# Patient Record
Sex: Male | Born: 1935 | Race: White | Hispanic: No | Smoking: Never smoker
Health system: Southern US, Community
[De-identification: ages and names within clinical notes are randomized; demographics above are authoritative.]

## PROBLEM LIST (undated history)

## (undated) DIAGNOSIS — Z950 Presence of cardiac pacemaker: Secondary | ICD-10-CM

## (undated) DIAGNOSIS — J449 Chronic obstructive pulmonary disease, unspecified: Secondary | ICD-10-CM

## (undated) DIAGNOSIS — I219 Acute myocardial infarction, unspecified: Secondary | ICD-10-CM

## (undated) DIAGNOSIS — I429 Cardiomyopathy, unspecified: Secondary | ICD-10-CM

## (undated) DIAGNOSIS — N189 Chronic kidney disease, unspecified: Secondary | ICD-10-CM

## (undated) DIAGNOSIS — K219 Gastro-esophageal reflux disease without esophagitis: Secondary | ICD-10-CM

## (undated) DIAGNOSIS — C679 Malignant neoplasm of bladder, unspecified: Secondary | ICD-10-CM

## (undated) DIAGNOSIS — C61 Malignant neoplasm of prostate: Secondary | ICD-10-CM

## (undated) DIAGNOSIS — I10 Essential (primary) hypertension: Secondary | ICD-10-CM

## (undated) DIAGNOSIS — I499 Cardiac arrhythmia, unspecified: Secondary | ICD-10-CM

## (undated) DIAGNOSIS — I5042 Chronic combined systolic (congestive) and diastolic (congestive) heart failure: Secondary | ICD-10-CM

## (undated) DIAGNOSIS — Z95810 Presence of automatic (implantable) cardiac defibrillator: Secondary | ICD-10-CM

## (undated) DIAGNOSIS — M1990 Unspecified osteoarthritis, unspecified site: Secondary | ICD-10-CM

## (undated) DIAGNOSIS — I2510 Atherosclerotic heart disease of native coronary artery without angina pectoris: Secondary | ICD-10-CM

## (undated) DIAGNOSIS — M109 Gout, unspecified: Secondary | ICD-10-CM

## (undated) DIAGNOSIS — E119 Type 2 diabetes mellitus without complications: Secondary | ICD-10-CM

## (undated) HISTORY — DX: Chronic obstructive pulmonary disease, unspecified: J44.9

## (undated) HISTORY — PX: INSERT / REPLACE / REMOVE PACEMAKER: SUR710

## (undated) HISTORY — PX: APPENDECTOMY: SHX54

## (undated) HISTORY — DX: Atherosclerotic heart disease of native coronary artery without angina pectoris: I25.10

## (undated) HISTORY — PX: TOTAL HIP ARTHROPLASTY: SHX124

## (undated) HISTORY — PX: CARDIAC CATHETERIZATION: SHX172

## (undated) HISTORY — PX: BLADDER REMOVAL: SHX567

## (undated) HISTORY — DX: Malignant neoplasm of prostate: C61

## (undated) HISTORY — DX: Presence of cardiac pacemaker: Z95.0

## (undated) HISTORY — DX: Gout, unspecified: M10.9

## (undated) HISTORY — DX: Chronic kidney disease, unspecified: N18.9

## (undated) HISTORY — PX: ILEOSTOMY: SHX1783

## (undated) HISTORY — DX: Cardiomyopathy, unspecified: I42.9

## (undated) HISTORY — PX: REVISION UROSTOMY CUTANEOUS: SUR1282

---

## 2004-08-09 ENCOUNTER — Ambulatory Visit: Payer: Self-pay | Admitting: Radiation Oncology

## 2007-11-24 ENCOUNTER — Ambulatory Visit: Payer: Self-pay | Admitting: Cardiovascular Disease

## 2008-03-07 ENCOUNTER — Ambulatory Visit: Payer: Self-pay | Admitting: Specialist

## 2008-07-07 ENCOUNTER — Ambulatory Visit: Payer: Self-pay | Admitting: Internal Medicine

## 2008-07-10 ENCOUNTER — Emergency Department: Payer: Self-pay | Admitting: Emergency Medicine

## 2008-07-11 ENCOUNTER — Inpatient Hospital Stay: Payer: Self-pay | Admitting: Internal Medicine

## 2008-07-12 ENCOUNTER — Ambulatory Visit: Payer: Self-pay | Admitting: Cardiology

## 2008-08-06 ENCOUNTER — Ambulatory Visit: Payer: Self-pay | Admitting: Internal Medicine

## 2008-09-06 ENCOUNTER — Emergency Department: Payer: Self-pay | Admitting: Emergency Medicine

## 2008-09-09 ENCOUNTER — Inpatient Hospital Stay: Payer: Self-pay | Admitting: Cardiovascular Disease

## 2009-05-27 ENCOUNTER — Inpatient Hospital Stay: Payer: Self-pay | Admitting: Internal Medicine

## 2010-03-12 ENCOUNTER — Ambulatory Visit: Payer: Self-pay | Admitting: Specialist

## 2010-03-29 ENCOUNTER — Emergency Department: Payer: Self-pay | Admitting: Emergency Medicine

## 2010-04-03 ENCOUNTER — Emergency Department: Payer: Self-pay | Admitting: Emergency Medicine

## 2010-04-23 ENCOUNTER — Emergency Department: Payer: Self-pay | Admitting: Emergency Medicine

## 2010-04-26 ENCOUNTER — Ambulatory Visit: Payer: Self-pay | Admitting: Urology

## 2010-05-02 ENCOUNTER — Ambulatory Visit: Payer: Self-pay | Admitting: Urology

## 2010-09-18 ENCOUNTER — Ambulatory Visit: Payer: Self-pay | Admitting: Urology

## 2010-09-26 ENCOUNTER — Ambulatory Visit: Payer: Self-pay | Admitting: Urology

## 2010-09-27 LAB — PATHOLOGY REPORT

## 2010-10-02 ENCOUNTER — Emergency Department: Payer: Self-pay | Admitting: Emergency Medicine

## 2010-10-16 ENCOUNTER — Ambulatory Visit: Payer: Self-pay | Admitting: Urology

## 2010-12-21 ENCOUNTER — Ambulatory Visit: Payer: Self-pay | Admitting: Urology

## 2010-12-21 DIAGNOSIS — I2510 Atherosclerotic heart disease of native coronary artery without angina pectoris: Secondary | ICD-10-CM

## 2011-01-30 ENCOUNTER — Ambulatory Visit: Payer: Self-pay | Admitting: Urology

## 2011-08-16 ENCOUNTER — Ambulatory Visit: Payer: Self-pay | Admitting: Urology

## 2011-08-17 LAB — URINE CULTURE

## 2011-08-28 ENCOUNTER — Ambulatory Visit: Payer: Self-pay | Admitting: Urology

## 2012-06-10 ENCOUNTER — Ambulatory Visit: Payer: Self-pay | Admitting: Urology

## 2012-06-10 LAB — BASIC METABOLIC PANEL
BUN: 23 mg/dL — ABNORMAL HIGH (ref 7–18)
Calcium, Total: 9.2 mg/dL (ref 8.5–10.1)
Chloride: 107 mmol/L (ref 98–107)
Glucose: 127 mg/dL — ABNORMAL HIGH (ref 65–99)
Osmolality: 283 (ref 275–301)

## 2012-06-10 LAB — CBC WITH DIFFERENTIAL/PLATELET
Basophil #: 0 10*3/uL (ref 0.0–0.1)
HCT: 39.2 % — ABNORMAL LOW (ref 40.0–52.0)
HGB: 13.3 g/dL (ref 13.0–18.0)
Lymphocyte #: 1.4 10*3/uL (ref 1.0–3.6)
Lymphocyte %: 19.3 %
MCH: 31.9 pg (ref 26.0–34.0)
MCHC: 33.9 g/dL (ref 32.0–36.0)
Monocyte #: 0.6 x10 3/mm (ref 0.2–1.0)
Platelet: 141 10*3/uL — ABNORMAL LOW (ref 150–440)
RBC: 4.17 10*6/uL — ABNORMAL LOW (ref 4.40–5.90)
RDW: 13.5 % (ref 11.5–14.5)
WBC: 7 10*3/uL (ref 3.8–10.6)

## 2012-06-12 LAB — URINE CULTURE

## 2012-06-17 ENCOUNTER — Ambulatory Visit: Payer: Self-pay | Admitting: Urology

## 2012-08-07 LAB — PATHOLOGY REPORT

## 2012-10-02 DIAGNOSIS — K219 Gastro-esophageal reflux disease without esophagitis: Secondary | ICD-10-CM | POA: Insufficient documentation

## 2012-10-02 DIAGNOSIS — M1990 Unspecified osteoarthritis, unspecified site: Secondary | ICD-10-CM | POA: Insufficient documentation

## 2012-10-02 DIAGNOSIS — E119 Type 2 diabetes mellitus without complications: Secondary | ICD-10-CM | POA: Insufficient documentation

## 2012-10-02 DIAGNOSIS — Z95810 Presence of automatic (implantable) cardiac defibrillator: Secondary | ICD-10-CM | POA: Insufficient documentation

## 2012-11-02 ENCOUNTER — Inpatient Hospital Stay: Payer: Self-pay | Admitting: Internal Medicine

## 2012-11-02 LAB — URINALYSIS, COMPLETE
Bacteria: NONE SEEN
Bilirubin,UR: NEGATIVE
Glucose,UR: NEGATIVE mg/dL (ref 0–75)
Nitrite: NEGATIVE
Ph: 6 (ref 4.5–8.0)
Protein: NEGATIVE
RBC,UR: 6 /HPF (ref 0–5)
Squamous Epithelial: NONE SEEN
WBC UR: 23 /HPF (ref 0–5)

## 2012-11-02 LAB — COMPREHENSIVE METABOLIC PANEL
Alkaline Phosphatase: 164 U/L — ABNORMAL HIGH (ref 50–136)
Bilirubin,Total: 1.2 mg/dL — ABNORMAL HIGH (ref 0.2–1.0)
Chloride: 98 mmol/L (ref 98–107)
Co2: 22 mmol/L (ref 21–32)
Creatinine: 1.62 mg/dL — ABNORMAL HIGH (ref 0.60–1.30)
EGFR (African American): 46 — ABNORMAL LOW
EGFR (Non-African Amer.): 40 — ABNORMAL LOW
Sodium: 131 mmol/L — ABNORMAL LOW (ref 136–145)
Total Protein: 6.7 g/dL (ref 6.4–8.2)

## 2012-11-02 LAB — CBC
HCT: 33.4 % — ABNORMAL LOW (ref 40.0–52.0)
HGB: 11.1 g/dL — ABNORMAL LOW (ref 13.0–18.0)
MCHC: 33.2 g/dL (ref 32.0–36.0)
MCV: 91 fL (ref 80–100)
Platelet: 383 10*3/uL (ref 150–440)
RBC: 3.66 10*6/uL — ABNORMAL LOW (ref 4.40–5.90)
RDW: 14.4 % (ref 11.5–14.5)
WBC: 26.9 10*3/uL — ABNORMAL HIGH (ref 3.8–10.6)

## 2012-11-03 LAB — CBC WITH DIFFERENTIAL/PLATELET
Basophil #: 0.1 10*3/uL (ref 0.0–0.1)
Eosinophil #: 0 10*3/uL (ref 0.0–0.7)
Eosinophil %: 0.2 %
HCT: 29.8 % — ABNORMAL LOW (ref 40.0–52.0)
HGB: 10 g/dL — ABNORMAL LOW (ref 13.0–18.0)
Lymphocyte #: 1.3 10*3/uL (ref 1.0–3.6)
Lymphocyte %: 5.6 %
MCH: 30.9 pg (ref 26.0–34.0)
MCHC: 33.6 g/dL (ref 32.0–36.0)
MCV: 92 fL (ref 80–100)
Monocyte #: 1.3 x10 3/mm — ABNORMAL HIGH (ref 0.2–1.0)
Monocyte %: 5.5 %
Neutrophil #: 20.2 10*3/uL — ABNORMAL HIGH (ref 1.4–6.5)
RBC: 3.24 10*6/uL — ABNORMAL LOW (ref 4.40–5.90)
RDW: 14.3 % (ref 11.5–14.5)
WBC: 22.9 10*3/uL — ABNORMAL HIGH (ref 3.8–10.6)

## 2012-11-03 LAB — BASIC METABOLIC PANEL
Anion Gap: 6 — ABNORMAL LOW (ref 7–16)
Chloride: 104 mmol/L (ref 98–107)
Co2: 22 mmol/L (ref 21–32)
EGFR (African American): >60
EGFR (Non-African Amer.): 56 — ABNORMAL LOW
Osmolality: 265 (ref 275–301)

## 2012-11-04 LAB — BASIC METABOLIC PANEL
Anion Gap: 6 — ABNORMAL LOW (ref 7–16)
BUN: 17 mg/dL (ref 7–18)
Calcium, Total: 8.8 mg/dL (ref 8.5–10.1)
Chloride: 104 mmol/L (ref 98–107)
Co2: 24 mmol/L (ref 21–32)
EGFR (Non-African Amer.): 57 — ABNORMAL LOW
Glucose: 75 mg/dL (ref 65–99)
Potassium: 3.9 mmol/L (ref 3.5–5.1)

## 2012-11-04 LAB — CBC WITH DIFFERENTIAL/PLATELET
Basophil %: 0.2 %
Eosinophil %: 1.3 %
MCH: 32.6 pg (ref 26.0–34.0)
MCV: 92 fL (ref 80–100)
Monocyte #: 0.7 x10 3/mm (ref 0.2–1.0)
Neutrophil #: 13.4 10*3/uL — ABNORMAL HIGH (ref 1.4–6.5)
Neutrophil %: 88.1 %
RDW: 14.3 % (ref 11.5–14.5)

## 2012-11-05 LAB — CBC WITH DIFFERENTIAL/PLATELET
Lymphocyte #: 0.9 10*3/uL — ABNORMAL LOW (ref 1.0–3.6)
MCH: 31.3 pg (ref 26.0–34.0)
MCHC: 34.6 g/dL (ref 32.0–36.0)
Monocyte #: 0.4 x10 3/mm (ref 0.2–1.0)
Neutrophil %: 79.7 %
Platelet: 246 10*3/uL (ref 150–440)
RBC: 3.08 10*6/uL — ABNORMAL LOW (ref 4.40–5.90)

## 2012-11-05 LAB — URINE CULTURE

## 2012-11-05 LAB — BASIC METABOLIC PANEL
Anion Gap: 5 — ABNORMAL LOW (ref 7–16)
Calcium, Total: 9.3 mg/dL (ref 8.5–10.1)
Chloride: 104 mmol/L (ref 98–107)
EGFR (Non-African Amer.): >60
Osmolality: 271 (ref 275–301)
Sodium: 135 mmol/L — ABNORMAL LOW (ref 136–145)

## 2012-11-06 LAB — CBC WITH DIFFERENTIAL/PLATELET
Basophil %: 0.9 %
Eosinophil #: 0.5 10*3/uL (ref 0.0–0.7)
Eosinophil %: 7.6 %
HCT: 30.1 % — ABNORMAL LOW (ref 40.0–52.0)
Lymphocyte #: 0.7 10*3/uL — ABNORMAL LOW (ref 1.0–3.6)
MCH: 31.2 pg (ref 26.0–34.0)
MCHC: 34.3 g/dL (ref 32.0–36.0)
MCV: 91 fL (ref 80–100)
Monocyte #: 0.5 x10 3/mm (ref 0.2–1.0)
Monocyte %: 6.9 %
Neutrophil #: 5.2 10*3/uL (ref 1.4–6.5)
Neutrophil %: 75 %
RDW: 14 % (ref 11.5–14.5)
WBC: 6.9 10*3/uL (ref 3.8–10.6)

## 2012-11-06 LAB — BASIC METABOLIC PANEL
BUN: 14 mg/dL (ref 7–18)
Calcium, Total: 9.7 mg/dL (ref 8.5–10.1)
Chloride: 101 mmol/L (ref 98–107)
Creatinine: 1.1 mg/dL (ref 0.60–1.30)
EGFR (Non-African Amer.): >60
Osmolality: 271 (ref 275–301)
Sodium: 135 mmol/L — ABNORMAL LOW (ref 136–145)

## 2012-11-07 LAB — CULTURE, BLOOD (SINGLE)

## 2013-11-16 ENCOUNTER — Ambulatory Visit: Payer: Self-pay | Admitting: Urology

## 2013-12-20 ENCOUNTER — Ambulatory Visit: Payer: Self-pay | Admitting: Urology

## 2013-12-20 LAB — CBC WITH DIFFERENTIAL/PLATELET
Basophil #: 0.1 10*3/uL (ref 0.0–0.1)
Basophil %: 1.2 %
Eosinophil #: 0.3 10*3/uL (ref 0.0–0.7)
Eosinophil %: 3.8 %
HCT: 45.5 % (ref 40.0–52.0)
HGB: 14.5 g/dL (ref 13.0–18.0)
LYMPHS ABS: 1.3 10*3/uL (ref 1.0–3.6)
LYMPHS PCT: 14.8 %
MCH: 30.8 pg (ref 26.0–34.0)
MCHC: 31.9 g/dL — ABNORMAL LOW (ref 32.0–36.0)
MCV: 97 fL (ref 80–100)
MONO ABS: 0.6 x10 3/mm (ref 0.2–1.0)
Monocyte %: 6.4 %
Neutrophil #: 6.6 10*3/uL — ABNORMAL HIGH (ref 1.4–6.5)
Neutrophil %: 73.8 %
Platelet: 175 10*3/uL (ref 150–440)
RBC: 4.71 10*6/uL (ref 4.40–5.90)
RDW: 13.7 % (ref 11.5–14.5)
WBC: 8.9 10*3/uL (ref 3.8–10.6)

## 2013-12-20 LAB — URINALYSIS, COMPLETE
Bilirubin,UR: NEGATIVE
GLUCOSE, UR: NEGATIVE mg/dL (ref 0–75)
Ketone: NEGATIVE
NITRITE: POSITIVE
Ph: 5 (ref 4.5–8.0)
Protein: NEGATIVE
SPECIFIC GRAVITY: 1.013 (ref 1.003–1.030)
Squamous Epithelial: NONE SEEN

## 2013-12-20 LAB — BASIC METABOLIC PANEL
Anion Gap: 7 (ref 7–16)
BUN: 23 mg/dL — ABNORMAL HIGH (ref 7–18)
CALCIUM: 9.7 mg/dL (ref 8.5–10.1)
Chloride: 104 mmol/L (ref 98–107)
Co2: 26 mmol/L (ref 21–32)
Creatinine: 1.59 mg/dL — ABNORMAL HIGH (ref 0.60–1.30)
EGFR (African American): 47 — ABNORMAL LOW
EGFR (Non-African Amer.): 41 — ABNORMAL LOW
GLUCOSE: 105 mg/dL — AB (ref 65–99)
Osmolality: 278 (ref 275–301)
Potassium: 4.7 mmol/L (ref 3.5–5.1)
Sodium: 137 mmol/L (ref 136–145)

## 2013-12-21 LAB — URINE CULTURE

## 2013-12-22 ENCOUNTER — Ambulatory Visit: Payer: Self-pay | Admitting: Urology

## 2013-12-28 LAB — PATHOLOGY REPORT

## 2014-01-24 ENCOUNTER — Ambulatory Visit: Payer: Self-pay | Admitting: Oncology

## 2014-01-26 ENCOUNTER — Ambulatory Visit: Payer: Self-pay | Admitting: Oncology

## 2014-02-06 ENCOUNTER — Ambulatory Visit: Payer: Self-pay | Admitting: Oncology

## 2014-02-17 ENCOUNTER — Ambulatory Visit: Payer: Self-pay | Admitting: Vascular Surgery

## 2014-02-21 LAB — CBC CANCER CENTER
BASOS ABS: 0.1 x10 3/mm (ref 0.0–0.1)
Basophil %: 1.5 %
Eosinophil #: 0.7 x10 3/mm (ref 0.0–0.7)
Eosinophil %: 9.3 %
HCT: 42.9 % (ref 40.0–52.0)
HGB: 14 g/dL (ref 13.0–18.0)
Lymphocyte #: 1.2 x10 3/mm (ref 1.0–3.6)
Lymphocyte %: 15.7 %
MCH: 31 pg (ref 26.0–34.0)
MCHC: 32.6 g/dL (ref 32.0–36.0)
MCV: 95 fL (ref 80–100)
MONO ABS: 0.4 x10 3/mm (ref 0.2–1.0)
Monocyte %: 5.2 %
Neutrophil #: 5.4 x10 3/mm (ref 1.4–6.5)
Neutrophil %: 68.3 %
Platelet: 152 x10 3/mm (ref 150–440)
RBC: 4.52 10*6/uL (ref 4.40–5.90)
RDW: 14.2 % (ref 11.5–14.5)
WBC: 7.9 x10 3/mm (ref 3.8–10.6)

## 2014-02-21 LAB — COMPREHENSIVE METABOLIC PANEL
ALK PHOS: 69 U/L
ALT: 19 U/L
Albumin: 3.7 g/dL (ref 3.4–5.0)
Anion Gap: 7 (ref 7–16)
BILIRUBIN TOTAL: 0.4 mg/dL (ref 0.2–1.0)
BUN: 16 mg/dL (ref 7–18)
CALCIUM: 9.6 mg/dL (ref 8.5–10.1)
CHLORIDE: 104 mmol/L (ref 98–107)
CO2: 29 mmol/L (ref 21–32)
Creatinine: 1.45 mg/dL — ABNORMAL HIGH (ref 0.60–1.30)
EGFR (African American): >60
EGFR (Non-African Amer.): 50 — ABNORMAL LOW
Glucose: 109 mg/dL — ABNORMAL HIGH (ref 65–99)
Osmolality: 281 (ref 275–301)
Potassium: 4.5 mmol/L (ref 3.5–5.1)
SGOT(AST): 18 U/L (ref 15–37)
SODIUM: 140 mmol/L (ref 136–145)
Total Protein: 7 g/dL (ref 6.4–8.2)

## 2014-02-28 LAB — COMPREHENSIVE METABOLIC PANEL
ALK PHOS: 73 U/L
ALT: 27 U/L
Albumin: 3.9 g/dL (ref 3.4–5.0)
Anion Gap: 10 (ref 7–16)
BUN: 27 mg/dL — ABNORMAL HIGH (ref 7–18)
Bilirubin,Total: 0.7 mg/dL (ref 0.2–1.0)
CHLORIDE: 101 mmol/L (ref 98–107)
CO2: 26 mmol/L (ref 21–32)
Calcium, Total: 9.9 mg/dL (ref 8.5–10.1)
Creatinine: 1.57 mg/dL — ABNORMAL HIGH (ref 0.60–1.30)
EGFR (African American): 55 — ABNORMAL LOW
EGFR (Non-African Amer.): 46 — ABNORMAL LOW
GLUCOSE: 116 mg/dL — AB (ref 65–99)
Osmolality: 280 (ref 275–301)
Potassium: 4.4 mmol/L (ref 3.5–5.1)
SGOT(AST): 18 U/L (ref 15–37)
SODIUM: 137 mmol/L (ref 136–145)
TOTAL PROTEIN: 7.1 g/dL (ref 6.4–8.2)

## 2014-02-28 LAB — CBC CANCER CENTER
BASOS PCT: 1.1 %
Basophil #: 0 x10 3/mm (ref 0.0–0.1)
EOS ABS: 0.4 x10 3/mm (ref 0.0–0.7)
EOS PCT: 10.4 %
HCT: 41.8 % (ref 40.0–52.0)
HGB: 13.3 g/dL (ref 13.0–18.0)
Lymphocyte #: 1.4 x10 3/mm (ref 1.0–3.6)
Lymphocyte %: 34.5 %
MCH: 30.6 pg (ref 26.0–34.0)
MCHC: 31.8 g/dL — ABNORMAL LOW (ref 32.0–36.0)
MCV: 96 fL (ref 80–100)
Monocyte #: 0.1 x10 3/mm — ABNORMAL LOW (ref 0.2–1.0)
Monocyte %: 2 %
NEUTROS ABS: 2.1 x10 3/mm (ref 1.4–6.5)
NEUTROS PCT: 52 %
PLATELETS: 117 x10 3/mm — AB (ref 150–440)
RBC: 4.34 10*6/uL — ABNORMAL LOW (ref 4.40–5.90)
RDW: 13.9 % (ref 11.5–14.5)
WBC: 4 x10 3/mm (ref 3.8–10.6)

## 2014-03-07 LAB — CBC CANCER CENTER
Basophil #: 0 x10 3/mm (ref 0.0–0.1)
Basophil %: 1 %
Eosinophil #: 0.1 x10 3/mm (ref 0.0–0.7)
Eosinophil %: 5.3 %
HCT: 34.2 % — ABNORMAL LOW (ref 40.0–52.0)
HGB: 11.1 g/dL — ABNORMAL LOW (ref 13.0–18.0)
LYMPHS ABS: 0.7 x10 3/mm — AB (ref 1.0–3.6)
Lymphocyte %: 63.9 %
MCH: 30.8 pg (ref 26.0–34.0)
MCHC: 32.6 g/dL (ref 32.0–36.0)
MCV: 95 fL (ref 80–100)
Monocyte #: 0 x10 3/mm — ABNORMAL LOW (ref 0.2–1.0)
Monocyte %: 1 %
NEUTROS ABS: 0.3 x10 3/mm — AB (ref 1.4–6.5)
Neutrophil %: 28.8 %
Platelet: 33 x10 3/mm — ABNORMAL LOW (ref 150–440)
RBC: 3.61 10*6/uL — ABNORMAL LOW (ref 4.40–5.90)
RDW: 13.3 % (ref 11.5–14.5)
WBC: 1.1 x10 3/mm — AB (ref 3.8–10.6)

## 2014-03-07 LAB — BASIC METABOLIC PANEL
ANION GAP: 7 (ref 7–16)
BUN: 25 mg/dL — AB (ref 7–18)
CALCIUM: 9.2 mg/dL (ref 8.5–10.1)
Chloride: 100 mmol/L (ref 98–107)
Co2: 29 mmol/L (ref 21–32)
Creatinine: 1.58 mg/dL — ABNORMAL HIGH (ref 0.60–1.30)
EGFR (Non-African Amer.): 45 — ABNORMAL LOW
GFR CALC AF AMER: 55 — AB
Glucose: 102 mg/dL — ABNORMAL HIGH (ref 65–99)
Osmolality: 277 (ref 275–301)
Potassium: 4.9 mmol/L (ref 3.5–5.1)
SODIUM: 136 mmol/L (ref 136–145)

## 2014-03-08 ENCOUNTER — Ambulatory Visit: Payer: Self-pay | Admitting: Oncology

## 2014-03-21 LAB — COMPREHENSIVE METABOLIC PANEL
Albumin: 3.5 g/dL (ref 3.4–5.0)
Alkaline Phosphatase: 88 U/L
Anion Gap: 7 (ref 7–16)
BUN: 15 mg/dL (ref 7–18)
Bilirubin,Total: 0.3 mg/dL (ref 0.2–1.0)
CALCIUM: 9.7 mg/dL (ref 8.5–10.1)
Chloride: 102 mmol/L (ref 98–107)
Co2: 30 mmol/L (ref 21–32)
Creatinine: 1.34 mg/dL — ABNORMAL HIGH (ref 0.60–1.30)
EGFR (Non-African Amer.): 55 — ABNORMAL LOW
GLUCOSE: 108 mg/dL — AB (ref 65–99)
Osmolality: 279 (ref 275–301)
POTASSIUM: 4.1 mmol/L (ref 3.5–5.1)
SGOT(AST): 26 U/L (ref 15–37)
SGPT (ALT): 30 U/L
Sodium: 139 mmol/L (ref 136–145)
Total Protein: 7 g/dL (ref 6.4–8.2)

## 2014-03-21 LAB — CBC CANCER CENTER
BASOS PCT: 1 %
Basophil #: 0.1 x10 3/mm (ref 0.0–0.1)
Eosinophil #: 0.1 x10 3/mm (ref 0.0–0.7)
Eosinophil %: 1.6 %
HCT: 36.7 % — ABNORMAL LOW (ref 40.0–52.0)
HGB: 12.1 g/dL — ABNORMAL LOW (ref 13.0–18.0)
LYMPHS ABS: 1.5 x10 3/mm (ref 1.0–3.6)
Lymphocyte %: 19.3 %
MCH: 30.9 pg (ref 26.0–34.0)
MCHC: 33 g/dL (ref 32.0–36.0)
MCV: 94 fL (ref 80–100)
MONOS PCT: 11.5 %
Monocyte #: 0.9 x10 3/mm (ref 0.2–1.0)
NEUTROS PCT: 66.6 %
Neutrophil #: 5.1 x10 3/mm (ref 1.4–6.5)
PLATELETS: 341 x10 3/mm (ref 150–440)
RBC: 3.91 10*6/uL — AB (ref 4.40–5.90)
RDW: 14 % (ref 11.5–14.5)
WBC: 7.7 x10 3/mm (ref 3.8–10.6)

## 2014-03-28 LAB — COMPREHENSIVE METABOLIC PANEL
ANION GAP: 9 (ref 7–16)
Albumin: 3.5 g/dL (ref 3.4–5.0)
Alkaline Phosphatase: 86 U/L
BUN: 23 mg/dL — ABNORMAL HIGH (ref 7–18)
Bilirubin,Total: 0.3 mg/dL (ref 0.2–1.0)
CALCIUM: 9.3 mg/dL (ref 8.5–10.1)
CREATININE: 1.51 mg/dL — AB (ref 0.60–1.30)
Chloride: 102 mmol/L (ref 98–107)
Co2: 28 mmol/L (ref 21–32)
EGFR (African American): 58 — ABNORMAL LOW
GFR CALC NON AF AMER: 48 — AB
Glucose: 156 mg/dL — ABNORMAL HIGH (ref 65–99)
Osmolality: 284 (ref 275–301)
POTASSIUM: 4.2 mmol/L (ref 3.5–5.1)
SGOT(AST): 24 U/L (ref 15–37)
SGPT (ALT): 48 U/L
SODIUM: 139 mmol/L (ref 136–145)
Total Protein: 6.7 g/dL (ref 6.4–8.2)

## 2014-03-28 LAB — CBC CANCER CENTER
BASOS ABS: 0 x10 3/mm (ref 0.0–0.1)
Basophil %: 0.3 %
EOS ABS: 0.1 x10 3/mm (ref 0.0–0.7)
Eosinophil %: 2.2 %
HCT: 35 % — ABNORMAL LOW (ref 40.0–52.0)
HGB: 11.5 g/dL — AB (ref 13.0–18.0)
LYMPHS ABS: 1.2 x10 3/mm (ref 1.0–3.6)
LYMPHS PCT: 28.5 %
MCH: 31 pg (ref 26.0–34.0)
MCHC: 33 g/dL (ref 32.0–36.0)
MCV: 94 fL (ref 80–100)
Monocyte #: 0.3 x10 3/mm (ref 0.2–1.0)
Monocyte %: 7.2 %
NEUTROS ABS: 2.5 x10 3/mm (ref 1.4–6.5)
NEUTROS PCT: 61.8 %
Platelet: 159 x10 3/mm (ref 150–440)
RBC: 3.72 10*6/uL — ABNORMAL LOW (ref 4.40–5.90)
RDW: 15 % — AB (ref 11.5–14.5)
WBC: 4.1 x10 3/mm (ref 3.8–10.6)

## 2014-04-08 ENCOUNTER — Ambulatory Visit: Payer: Self-pay | Admitting: Oncology

## 2014-04-11 LAB — BASIC METABOLIC PANEL
ANION GAP: 9 (ref 7–16)
BUN: 19 mg/dL — ABNORMAL HIGH (ref 7–18)
CO2: 27 mmol/L (ref 21–32)
CREATININE: 1.67 mg/dL — AB (ref 0.60–1.30)
Calcium, Total: 9.5 mg/dL (ref 8.5–10.1)
Chloride: 102 mmol/L (ref 98–107)
GFR CALC AF AMER: 51 — AB
GFR CALC NON AF AMER: 42 — AB
GLUCOSE: 90 mg/dL (ref 65–99)
Osmolality: 277 (ref 275–301)
Potassium: 4.3 mmol/L (ref 3.5–5.1)
SODIUM: 138 mmol/L (ref 136–145)

## 2014-04-11 LAB — CBC CANCER CENTER
Basophil #: 0.1 x10 3/mm (ref 0.0–0.1)
Basophil %: 1.3 %
EOS PCT: 8 %
Eosinophil #: 0.4 x10 3/mm (ref 0.0–0.7)
HCT: 33.8 % — AB (ref 40.0–52.0)
HGB: 11.3 g/dL — ABNORMAL LOW (ref 13.0–18.0)
LYMPHS ABS: 1.3 x10 3/mm (ref 1.0–3.6)
Lymphocyte %: 28.2 %
MCH: 31.4 pg (ref 26.0–34.0)
MCHC: 33.3 g/dL (ref 32.0–36.0)
MCV: 94 fL (ref 80–100)
MONOS PCT: 10.6 %
Monocyte #: 0.5 x10 3/mm (ref 0.2–1.0)
NEUTROS ABS: 2.5 x10 3/mm (ref 1.4–6.5)
Neutrophil %: 51.9 %
Platelet: 217 x10 3/mm (ref 150–440)
RBC: 3.58 10*6/uL — ABNORMAL LOW (ref 4.40–5.90)
RDW: 15.9 % — ABNORMAL HIGH (ref 11.5–14.5)
WBC: 4.8 x10 3/mm (ref 3.8–10.6)

## 2014-04-18 LAB — CBC CANCER CENTER
BASOS ABS: 0 x10 3/mm (ref 0.0–0.1)
BASOS PCT: 0.4 %
EOS PCT: 4 %
Eosinophil #: 0.1 x10 3/mm (ref 0.0–0.7)
HCT: 32.5 % — AB (ref 40.0–52.0)
HGB: 10.8 g/dL — ABNORMAL LOW (ref 13.0–18.0)
LYMPHS ABS: 1.2 x10 3/mm (ref 1.0–3.6)
Lymphocyte %: 39.6 %
MCH: 31.9 pg (ref 26.0–34.0)
MCHC: 33.4 g/dL (ref 32.0–36.0)
MCV: 96 fL (ref 80–100)
MONO ABS: 0.2 x10 3/mm (ref 0.2–1.0)
Monocyte %: 5.5 %
Neutrophil #: 1.5 x10 3/mm (ref 1.4–6.5)
Neutrophil %: 50.5 %
Platelet: 229 x10 3/mm (ref 150–440)
RBC: 3.4 10*6/uL — ABNORMAL LOW (ref 4.40–5.90)
RDW: 16.4 % — ABNORMAL HIGH (ref 11.5–14.5)
WBC: 2.9 x10 3/mm — AB (ref 3.8–10.6)

## 2014-04-18 LAB — BASIC METABOLIC PANEL
ANION GAP: 5 — AB (ref 7–16)
BUN: 22 mg/dL — ABNORMAL HIGH (ref 7–18)
CHLORIDE: 103 mmol/L (ref 98–107)
CREATININE: 1.53 mg/dL — AB (ref 0.60–1.30)
Calcium, Total: 9.7 mg/dL (ref 8.5–10.1)
Co2: 30 mmol/L (ref 21–32)
EGFR (African American): 57 — ABNORMAL LOW
EGFR (Non-African Amer.): 47 — ABNORMAL LOW
Glucose: 113 mg/dL — ABNORMAL HIGH (ref 65–99)
Osmolality: 280 (ref 275–301)
Potassium: 4.4 mmol/L (ref 3.5–5.1)
SODIUM: 138 mmol/L (ref 136–145)

## 2014-05-02 LAB — CBC CANCER CENTER
Basophil #: 0 x10 3/mm (ref 0.0–0.1)
Basophil %: 1.2 %
EOS ABS: 0.2 x10 3/mm (ref 0.0–0.7)
Eosinophil %: 6.4 %
HCT: 26.8 % — ABNORMAL LOW (ref 40.0–52.0)
HGB: 9 g/dL — AB (ref 13.0–18.0)
LYMPHS ABS: 0.8 x10 3/mm — AB (ref 1.0–3.6)
LYMPHS PCT: 24.6 %
MCH: 32.8 pg (ref 26.0–34.0)
MCHC: 33.4 g/dL (ref 32.0–36.0)
MCV: 98 fL (ref 80–100)
Monocyte #: 0.5 x10 3/mm (ref 0.2–1.0)
Monocyte %: 15.4 %
NEUTROS ABS: 1.6 x10 3/mm (ref 1.4–6.5)
Neutrophil %: 52.4 %
Platelet: 123 x10 3/mm — ABNORMAL LOW (ref 150–440)
RBC: 2.74 10*6/uL — ABNORMAL LOW (ref 4.40–5.90)
RDW: 19.3 % — AB (ref 11.5–14.5)
WBC: 3.1 x10 3/mm — ABNORMAL LOW (ref 3.8–10.6)

## 2014-05-02 LAB — BASIC METABOLIC PANEL
Anion Gap: 9 (ref 7–16)
BUN: 18 mg/dL (ref 7–18)
CALCIUM: 9.2 mg/dL (ref 8.5–10.1)
Chloride: 101 mmol/L (ref 98–107)
Co2: 27 mmol/L (ref 21–32)
Creatinine: 1.59 mg/dL — ABNORMAL HIGH (ref 0.60–1.30)
EGFR (African American): 54 — ABNORMAL LOW
GFR CALC NON AF AMER: 45 — AB
GLUCOSE: 175 mg/dL — AB (ref 65–99)
OSMOLALITY: 280 (ref 275–301)
POTASSIUM: 4.5 mmol/L (ref 3.5–5.1)
Sodium: 137 mmol/L (ref 136–145)

## 2014-05-09 ENCOUNTER — Ambulatory Visit: Payer: Self-pay | Admitting: Oncology

## 2014-05-09 LAB — CBC CANCER CENTER
Basophil #: 0 10*3/uL
Basophil %: 1.4 %
Eosinophil #: 0.1 10*3/uL
Eosinophil %: 3 %
HCT: 27.1 % — ABNORMAL LOW
HGB: 8.9 g/dL — ABNORMAL LOW
Lymphocyte %: 31.2 %
Lymphs Abs: 0.7 10*3/uL — ABNORMAL LOW
MCH: 32.3 pg
MCHC: 32.7 g/dL
MCV: 99 fL
Monocyte #: 0.1 10*3/uL — ABNORMAL LOW
Monocyte %: 5.3 %
Neutrophil #: 1.3 10*3/uL — ABNORMAL LOW
Neutrophil %: 59.1 %
Platelet: 161 10*3/uL
RBC: 2.74 10*6/uL — ABNORMAL LOW
RDW: 18.9 % — ABNORMAL HIGH
WBC: 2.2 10*3/uL — ABNORMAL LOW

## 2014-05-09 LAB — BASIC METABOLIC PANEL
ANION GAP: 3 — AB (ref 7–16)
BUN: 18 mg/dL (ref 7–18)
CO2: 29 mmol/L (ref 21–32)
CREATININE: 1.47 mg/dL — AB (ref 0.60–1.30)
Calcium, Total: 9.3 mg/dL (ref 8.5–10.1)
Chloride: 105 mmol/L (ref 98–107)
GFR CALC AF AMER: 59 — AB
GFR CALC NON AF AMER: 49 — AB
Glucose: 110 mg/dL — ABNORMAL HIGH (ref 65–99)
Osmolality: 276 (ref 275–301)
Potassium: 4.1 mmol/L (ref 3.5–5.1)
SODIUM: 137 mmol/L (ref 136–145)

## 2014-06-07 ENCOUNTER — Ambulatory Visit
Admit: 2014-06-07 | Disposition: A | Discharge status: Home / Self Care | Payer: Self-pay | Attending: Oncology | Admitting: Oncology

## 2014-06-21 ENCOUNTER — Ambulatory Visit: Payer: Self-pay | Admitting: Oncology

## 2014-07-08 ENCOUNTER — Ambulatory Visit
Admit: 2014-07-08 | Disposition: A | Discharge status: Home / Self Care | Payer: Self-pay | Attending: Oncology | Admitting: Oncology

## 2014-07-29 NOTE — Op Note (Signed)
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   PROCEDURE: Cystoscopy with bladder biopsies.   SURGEON: John Giovanni, M.D.   ASSISTANT: None.   ANESTHESIA: General.   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 He has significant urinary frequency, urgency and refused BCG therapy. He has had 3 subsequent bladder biopsies which have been negative for recurrent tumor. Recent surveillance cystoscopy did show bladder erythema. A urine cytology was suspicious for malignant cells. He presents for cystoscopy under anesthesia and bladder biopsies, possible TURBT.   DESCRIPTION OF PROCEDURE: He was taken to the operating room where a general anesthetic was administered. He was then placed in the low lithotomy position and his external genitalia were prepped and draped in the usual fashion. A timeout was performed per protocol. A 21-French cystoscope with 30 degree lens was lubricated and passed under direct vision. The urethra was normal in caliber without stricture. The prostate was nonocclusive. There was an area endoscopically consistent with squamous metaplasia, on the left lateral lobe. Bladder mucosa was closely inspected. There was moderate trabeculation present. There were areas endoscopically consistent with squamous metaplasia, on the trigone. There was mild erythema on the posterior and lateral walls. No papillary or solid lesions were identified.  Ureteral orifices were normal-appearing with clear efflux. Cold cup bladder biopsies were then obtained, of the areas of erythema, on the posterior lateral walls. The  areas of squamous metaplasia, on the trigone and prostate, were also biopsied and sent separately. Hemostasis was obtained with a Bugbee electrode. Once hemostasis was adequate, the cystoscope was removed. An 18-French Foley catheter was placed with return of light rosea effluent upon irrigation. He was taken to PAC-U in stable condition. There were no complications. EBL minimal.  ____________________________ Ronda Fairly. Bernardo Heater, MD scs:sb D: 06/18/2012 09:16:57 ET T: 06/18/2012 09:29:34 ET JOB#: [id number]  cc: Nicki Reaper C. Bernardo Heater, MD, <Dictator> Abbie Sons MD ELECTRONICALLY SIGNED 06/18/2012 22:52

## 2014-07-29 NOTE — Discharge Summary (Signed)
PATIENT NAME:  Bryce Clark, Bryce Clark MR#:  [id number] DATE OF BIRTH:  Jun 07, 1932  DATE OF ADMISSION:  11/02/2012 DATE OF DISCHARGE:  11/06/2012  ADMITTING PHYSICIAN: Dr. Lunette Stands PRIMARY CARE PHYSICIAN: Dr. Elijio Miles    PROCEDURES: None.   DISCHARGE DIAGNOSES: 1.  Urinary tract infection.  2.  Septicemia.  3.  Diabetes mellitus.  4.  Acute renal failure.   DISCHARGE MEDICATIONS:  1.  Ciprofloxacin 500 mg b.i.d.  2.  Glucerna shakes 237 mL t.i.d.  3.  Flomax 0.4 mg daily.  4.  Amlodipine 10 mg daily.  5.  Tramadol 50 mg p.r.n. q. 6 hours.  6.  Colchicine 0.6 mg once daily.  7.  Carafate 1 gram q.i.d.  8.  Simvastatin 40 mg at bedtime.  9.  Losartan 100 mg each morning. 10.  Pantoprazole 40 mg daily.  11.  Onglyza 5 mg daily.  12.  Trazodone 50 mg at bedtime.   HOSPITAL COURSE: This gentleman was admitted through the Emergency Room where he complained of fever, chills, lightheadedness and dizziness. Please refer to history and physical for full details. The patient had recently undergone cystectomy with urostomy placement and shortly thereafter developed these symptoms.  Clinical evaluation revealed a urinary tract infection with clinical features of sepsis and acute renal failure. He was admitted to a medical floor where he received intravenous antibiotic, namely Rocephin. He was hydrated with fluids, and I held his oral diabetic agents. The patient'[name] renal function gradually improved. His urine culture sensitivities revealed E. coli that was sensitive to Cipro. His antibiotic was changed to oral medication without any adverse events. He became much more awake, alert, was somewhat deconditioned, however, he was skilled to return to home with a home health nurse, physical therapy.   The patient was discharged to home in satisfactory condition.   DIET: Low fat, low cholesterol, ADA diet.   ACTIVITY: As tolerated.   FOLLOWUP: In 1 to 2 weeks with Dr. Elijio Miles.   DISCHARGE PROCESS  TIME SPENT: 35 minutes.   ____________________________ Venetia Maxon Elijio Miles, MD sat:cb D: 11/11/2012 13:38:51 ET T: 11/11/2012 20:24:00 ET JOB#: [id number]  cc: Sheikh A. Elijio Miles, MD, <Dictator> Veverly Fells MD ELECTRONICALLY SIGNED 11/24/2012 13:46

## 2014-07-29 NOTE — H&P (Signed)
PATIENT NAME:  Bryce Clark, Bryce Clark MR#:  [id number] DATE OF BIRTH:  [birth year]  DATE OF ADMISSION:  11/02/2012  REFERRING PHYSICIAN: Dr. Reita Cliche.   PRIMARY CARE PHYSICIAN: Dr. Elijio Miles.   Mr. Villamar  who was recently diagnosed with bladder and prostate CA, underwent prostate and bladder resection at [facility] on the 11th of this month. Per the patient, postsurgical period was unremarkable. Since yesterday, started to experience severe generalized weakness to the point that the patient was unable to stand up and walk. Also was having chills. Did not check his temperature. Concerned about the patient's severe lethargy, called EMS and he was brought to the Emergency Department. Workup in the Emergency Department, the patient is found to have a urinary tract infection, elevated white blood cell count of 23,000. CT abdomen and pelvis done in the Emergency Department does not show any abscess. Concerning the patient's recent surgery, contacted [facility] who accepted the patient; however, they did not have any beds available at this time. The patient preferred to stay and receive treatment at [facility]. The patient received 1 dose of Rocephin in the Emergency Department. The patient states he has been having poor appetite. Also has been experiencing nausea. Did not have any episodes of vomiting.   PAST MEDICAL HISTORY:  1. Mitral valve regurgitation.  2. Gout.  3. Coronary artery disease.  4. Diabetes mellitus, type 2, noninsulin dependent.  5. History of bladder cancer, status post bladder resection.  6. Cardiomyopathy, status post pacemaker and defibrillator placement.  7. COPD.   8. Prostate CA.   9. Hypertension.  10. Hyperlipidemia.  11. Osteoarthritis.  12. Gastroesophageal reflux disease.   PAST SURGICAL HISTORY:  1. Prostatectomy.  2. Bladder resection.  3. Left hip replacement.  4. Hemorrhoidectomy.  5. Seed implant of the prostate.  6. Pacemaker, defibrillator  placement.   ALLERGIES:  1. CODEINE.  2. . 3. PERCOCET.   HOME MEDICATIONS:  1. Trazodone 50 mg at bedtime.  2. 1 tablet every 6 hours.  3. Sucralfate 1 mg 4 times a day.  4. Simvastatin 40 mg daily.  5. Protonix 40 mg daily.  6. Onglyza 5 mg daily.  7. Losartan 100 mg once a day.  8. Glipizide 10 mg 2 times a day.   SOCIAL HISTORY: Smoked in the past, quit a few years back. Denies drinking alcohol or using illicit drugs. Lives with his girlfriend.   FAMILY HISTORY: Mother died of CVA in her 62s. Father died of CVA in his 37s. Sister with dementia.   REVIEW OF SYSTEMS:  CONSTITUTIONAL: Severe generalized weakness.  EYES: No change in vision.  ENT: No change in hearing. No sore throat.  RESPIRATORY: No cough, shortness of breath.  CARDIOVASCULAR: No chest pain, palpitations. No pedal edema.  GENITOURINARY: Uncertain about the complete understanding about the surgery. Most likely, the patient might have had neobladder formation with catheter placement.  ENDOCRINE: No polyuria or polydipsia.   SKIN: No rash or lesions.  MUSCULOSKELETAL:. Has generalized body aches.  NEUROLOGIC: No weakness or numbness in any part of the body.   PHYSICAL EXAMINATION:  GENERAL: This is a thin built male lying down in the bed, not in distress.  VITAL SIGNS: Temperature 99.9, pulse 106, blood pressure 104/67, respiratory rate of 14, oxygen saturation 100% on room air.  HEENT: Head normocephalic, atraumatic. There is no scleral icterus. Conjunctivae normal. Pupils equal and react to light. Extraocular movements are intact. Mucous membranes moist. No pharyngeal erythema.  NECK: Supple. No lymphadenopathy. No JVD. No carotid bruit.  CHEST: No focal tenderness.  LUNGS: Bilaterally clear to auscultation.  HEART: S1 and S2, regular, tachycardia. No pedal edema. Pulses 2+.  ABDOMEN: Bowel sounds present. Soft, mild tenderness at the surgical site. No guarding or rebound tenderness.  SKIN: No rash or  lesions.  MUSCULOSKELETAL: Good range of motion in all of the extremities.  NEUROLOGIC: The patient is alert, oriented to place, person and time. Cranial nerves II through XII intact. No motor and sensory deficits.   LABS: CBC: WBC of 27,000, hemoglobin 11, platelet count of 383.   CMP: BUN 28, creatinine of 1.62. Total bilirubin 1.0, alk phos of 164.    UA 2+ leukocyte esterase, negative for nitrites, 23 WBCs.   CT abdomen and pelvis shows postoperative changes. The patient is status post vasectomy and prostatectomy. Left nephrolithiasis.    ASSESSMENT AND PLAN: Mr. Burnside is a 81 year old male who comes to the Emergency Department with severe generalized weakness and is found to be septic from urinary tract infection.  1. Sepsis: Will obtain blood and urine cultures. Continue with Rocephin and vancomycin concerning the patient's surgical procedure. Will de-escalate the antibiotics once culture data is available.  2. Urinary tract infection: As mentioned above, continue with antibiotics.  3. Mild renal insufficiency: There might be a component of dehydration as well. Continue with intravenous fluids and follow up.  4. Hypertension: Currently well controlled. Continue with the home medications.  5. Diabetes mellitus: Will hold the medications for now concerning the patient's renal insufficiency. Continue with sliding scale insulin for now.  6. Keep the patient on deep vein thrombosis prophylaxis with Lovenox.   TIME SPENT: 50 minutes.    ____________________________ Monica Becton, MD pv:gb D: 11/03/2012 00:31:57 ET T: 11/03/2012 02:11:05 ET JOB#: [id number]  cc: Monica Becton, MD, <Dictator> Sheikh A. Elijio Miles, MD Monica Becton MD ELECTRONICALLY SIGNED 12/09/2012 21:46

## 2014-07-30 NOTE — Op Note (Signed)
PATIENT NAME:  Bryce Clark, Bryce Clark MR#:  [id number] DATE OF BIRTH:  1933-03-23  DATE OF PROCEDURE:  12/22/2013  PREOPERATIVE DIAGNOSIS:  Urethral tumor.  POSTOPERATIVE DIAGNOSIS:  Urethral tumor.  PROCEDURE:  Urethroscopy, urethral biopsy, transurethral resection of urethral tumor, fulguration of tumor.  ATTENDING SURGEON:  Sherlynn Stalls, M.D.  ANESTHESIA:  General anesthesia.  ESTIMATED BLOOD LOSS:  Minimal.  DRAINS:  None.  COMPLICATIONS:  None.  SPECIMENS:  Urethral tumor.  INDICATION:  This is a 81 year old male with a history of bladder cancer status post cystectomy who presented to the clinic with penile discharge and bleeding.  Office cystoscopy revealed a tumor within the bulbar urethra.  He was advised to proceed with urethral biopsy in the operating room.  Risks and benefits of the procedure were explained in detail. The patient agreed to proceed with the planned procedure.  The patient was correctly identified in the preoperative holding area.  Informed consent was confirmed.  He was brought to the operating room suite and placed on the table in the supine position.  At this time, the universal timeout protocol was performed.  All team members were identified. Venodyne boots were placed and he was administered IV Ancef in the perioperative period. He was then placed under general anesthesia with mask LMA,  repositioned in the dorsal lithotomy position, and prepped and draped in the standard surgical fashion. At this point in time, a rigid cystoscope using the 22 French access sheath was advanced per urethra until papillary tumor was encountered within the bulb.  The tumor appeared fairly well-circumscribed on a relatively narrow stalk and had a low grade appearance.  Cold cup biopsy forceps were used to grasp several large pieces of the tumors which were passed off as surgical specimen.  There were several tumors within the bulbar and posterior membranous urethra.  The more distal  appeared lower grade on a fine stalk; however, the more proximal tumors appeared more extensive on a broad base.  There was approximately 2 to 3 cm of tumor which was identified.  For a more extensive resection, an Olympus bipolar resectoscope was brought in and using the loop electrocautery, the remaining tumor was fully resected.  Areas of bleeding were fulgurated.  Any remaining mucosa abnormality was also fulgurated.  In the most proximal portion of the urethra which was able to be visualized, there was a somewhat narrow opening through which the resectoscope could not pass.  At this point in time, the resectoscope was removed and the urethra was copiously irrigated using a 60 mL cap tipped syringe with water in order to attempt to lyse any free-floating tumor cells.  Once this was complete, a flexible Storz cystoscope was advanced into the urethra which again was completely inspected, and there was no remaining tumor identified.  The most proximal portion of the urethral stump was visualized using the smaller scope and there was no tumor noted in this area.  The scope was then removed.  Finally, a rectal exam was performed which revealed a surgically absent prostate and no clear anterior rectal wall masses or any fixed areas worrisome for pelvic recurrence.  At this point in time, the patient was cleaned and dried and reversed from anesthesia after being repositioned in the supine position.  He was taken to the PACU in stable condition.  There were no complications in this case.     ____________________________ Sherlynn Stalls, MD ajb:nr D: 12/22/2013 21:02:25 ET T: 12/22/2013 21:26:30 ET JOB#: [id number]  cc:  Sherlynn Stalls, MD, <Dictator> Sherlynn Stalls MD ELECTRONICALLY SIGNED 01/16/2014 8:36

## 2014-07-30 NOTE — Op Note (Signed)
PATIENT NAME:  Bryce Clark, MCMILLON MR#:  [id number] DATE OF BIRTH:  1932-05-16  DATE OF PROCEDURE:  02/17/2014  PREOPERATIVE DIAGNOSIS:  Bladder cancer with poor venous access.   POSTOPERATIVE DIAGNOSIS:  Bladder cancer with poor venous access.   PROCEDURES:  1.  Ultrasound guidance for vascular access, right internal jugular vein.  2.  Fluoroscopic guidance for placement of catheter.  3.  Placement of CT compatible Port-A-Cath, right internal jugular vein.   SURGEON:  Algernon Huxley, MD.  ANESTHESIA:  Local with moderate conscious sedation.   FLUOROSCOPY TIME:  Less than 1 minute.   CONTRAST:  Zero.   ESTIMATED BLOOD LOSS:  Minimal.  INDICATION FOR PROCEDURE: A 81 year old gentleman with bladder cancer who needs a Port-a-Cath for chemotherapy and durable venous access. We are asked to place this. Risks and benefits were discussed. Informed consent was obtained.   DESCRIPTION OF THE PROCEDURE:  The patient was brought to the vascular and interventional radiology suite. The right neck and chest were sterilely prepped and draped, and a sterile surgical field was created. Ultrasound was used to help visualize a patent right internal jugular vein. This was then accessed under direct ultrasound guidance without difficulty with a Seldinger needle and a permanent image was recorded. A J-wire was placed. After skin nick and dilatation, the peel-away sheath was then placed over the wire. I then anesthetized an area under the clavicle approximately 2 fingerbreadths. A transverse incision was created and an inferior pocket was created with electrocautery and blunt dissection. The port was then brought onto the field, placed into the pocket and secured to the chest wall with 2 Prolene sutures. The catheter was connected to the port and tunneled from the subclavicular incision to the access site. Fluoroscopic guidance was used to cut the catheter to an appropriate length. The catheter was then placed through  the peel-away sheath and the peel-away sheath was removed. The catheter tip was parked in excellent location in the distal superior vena cava. The pocket was then irrigated with antibiotic-impregnated saline and the wound was closed with a running 3-0 Vicryl and a 4-0 Monocryl. The access incision was closed with a single 4-0 Monocryl. The Huber needle was used to withdraw blood and flush the port with heparinized saline. Dermabond was then placed as a dressing. The patient tolerated the procedure well and was taken to the recovery room in stable condition.    ____________________________ Algernon Huxley, MD jsd:lt D: 02/17/2014 10:44:56 ET T: 02/17/2014 15:10:14 ET JOB#: [id number]  cc: Algernon Huxley, MD, <Dictator> Algernon Huxley MD ELECTRONICALLY SIGNED 02/21/2014 10:12

## 2014-07-31 NOTE — Op Note (Signed)
PATIENT NAME:  Bryce Clark, Bryce Clark MR#:  [id number] DATE OF BIRTH:  [birth year]  DATE OF PROCEDURE:  08/28/2011  PREOPERATIVE DIAGNOSIS: Chronic irritative voiding symptoms/personal history of bladder cancer.   POSTOPERATIVE DIAGNOSIS: Chronic irritative voiding symptoms/personal history of bladder cancer.   PROCEDURE: Cystoscopy with bladder biopsy.   SURGEON: Shabana Armentrout C. Bernardo Heater, MD   ASSISTANT: None.   ANESTHETIC: General.   INDICATIONS: This is a 81 year old male with a history of transitional cell carcinoma of the bladder. 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 He is not able to tolerate office cystoscopy and presents today for surveillance cystoscopy.   FINDINGS:  1. Urethra wide caliber bulb stricture which allows passage of the 21 French cystoscope.  2. Mild lateral lobe enlargement in the prostate.  3. No papillary bladder lesions noted. There was an area on the right posterior wall with some erythema without papillary change.   DESCRIPTION OF PROCEDURE: The patient was taken to the operating room where a general anesthetic was administered. He was placed in the low lithotomy position and his external genitalia were prepped and draped in standard fashion. A 21 French cystoscope with 30 degree lens was lubricated and passed under direct vision with findings as described above. Panendoscopy was performed with 30 and 70 degree lenses. Using cold cup biopsy forceps, the right posterior wall area was biopsied x3. Biopsy site was then fulgurated with a Bugbee electrode. A 16 French Foley catheter was placed with return of light rosea effluent upon irrigation.   A B and O suppository was placed per rectum. He was taken to PAC-U in stable condition. There were no complications. EBL was minimal.    ____________________________ Ronda Fairly. Bernardo Heater, MD scs:drc D: 08/28/2011 10:51:52  ET T: 08/28/2011 11:33:49 ET JOB#: [id number]  cc: Nicki Reaper C. Bernardo Heater, MD, <Dictator> Abbie Sons MD ELECTRONICALLY SIGNED 09/05/2011 14:21

## 2014-08-22 ENCOUNTER — Ambulatory Visit: Admission: RE | Admit: 2014-08-22 | Payer: Self-pay | Source: Ambulatory Visit | Admitting: Ophthalmology

## 2014-08-22 ENCOUNTER — Encounter: Admission: RE | Payer: Self-pay | Source: Ambulatory Visit

## 2014-08-22 SURGERY — PHACOEMULSIFICATION, CATARACT, WITH IOL INSERTION
Anesthesia: Choice | Laterality: Left

## 2014-08-31 ENCOUNTER — Encounter
Admission: RE | Admit: 2014-08-31 | Discharge: 2014-08-31 | Disposition: A | Discharge status: Home / Self Care | Payer: Medicare HMO | Source: Ambulatory Visit | Attending: Ophthalmology | Admitting: Ophthalmology

## 2014-08-31 DIAGNOSIS — H2513 Age-related nuclear cataract, bilateral: Secondary | ICD-10-CM | POA: Insufficient documentation

## 2014-08-31 DIAGNOSIS — Z01812 Encounter for preprocedural laboratory examination: Secondary | ICD-10-CM | POA: Insufficient documentation

## 2014-08-31 DIAGNOSIS — Z79899 Other long term (current) drug therapy: Secondary | ICD-10-CM | POA: Diagnosis not present

## 2014-08-31 LAB — POTASSIUM: Potassium: 4.8 mmol/L (ref 3.5–5.1)

## 2014-09-07 ENCOUNTER — Encounter: Payer: Self-pay | Admitting: *Deleted

## 2014-09-07 DIAGNOSIS — Z8546 Personal history of malignant neoplasm of prostate: Secondary | ICD-10-CM | POA: Diagnosis not present

## 2014-09-07 DIAGNOSIS — E119 Type 2 diabetes mellitus without complications: Secondary | ICD-10-CM | POA: Diagnosis not present

## 2014-09-07 DIAGNOSIS — I10 Essential (primary) hypertension: Secondary | ICD-10-CM | POA: Diagnosis not present

## 2014-09-07 DIAGNOSIS — Z885 Allergy status to narcotic agent status: Secondary | ICD-10-CM | POA: Diagnosis not present

## 2014-09-07 DIAGNOSIS — Z7982 Long term (current) use of aspirin: Secondary | ICD-10-CM | POA: Diagnosis not present

## 2014-09-07 DIAGNOSIS — Z79899 Other long term (current) drug therapy: Secondary | ICD-10-CM | POA: Diagnosis not present

## 2014-09-07 DIAGNOSIS — I509 Heart failure, unspecified: Secondary | ICD-10-CM | POA: Diagnosis not present

## 2014-09-07 DIAGNOSIS — H2512 Age-related nuclear cataract, left eye: Secondary | ICD-10-CM | POA: Diagnosis not present

## 2014-09-07 DIAGNOSIS — Z932 Ileostomy status: Secondary | ICD-10-CM | POA: Diagnosis not present

## 2014-09-07 DIAGNOSIS — H269 Unspecified cataract: Secondary | ICD-10-CM | POA: Diagnosis present

## 2014-09-07 DIAGNOSIS — Z9889 Other specified postprocedural states: Secondary | ICD-10-CM | POA: Diagnosis not present

## 2014-09-07 DIAGNOSIS — M1990 Unspecified osteoarthritis, unspecified site: Secondary | ICD-10-CM | POA: Diagnosis not present

## 2014-09-07 DIAGNOSIS — Z950 Presence of cardiac pacemaker: Secondary | ICD-10-CM | POA: Diagnosis not present

## 2014-09-07 DIAGNOSIS — Z87891 Personal history of nicotine dependence: Secondary | ICD-10-CM | POA: Diagnosis not present

## 2014-09-12 ENCOUNTER — Encounter
Admission: RE | Disposition: A | Discharge status: Home / Self Care | Payer: Self-pay | Source: Ambulatory Visit | Attending: Ophthalmology

## 2014-09-12 ENCOUNTER — Ambulatory Visit: Payer: Medicare HMO | Admitting: Certified Registered Nurse Anesthetist

## 2014-09-12 ENCOUNTER — Ambulatory Visit
Admission: RE | Admit: 2014-09-12 | Discharge: 2014-09-12 | Disposition: A | Discharge status: Home / Self Care | Payer: Medicare HMO | Source: Ambulatory Visit | Attending: Ophthalmology | Admitting: Ophthalmology

## 2014-09-12 ENCOUNTER — Encounter: Payer: Self-pay | Admitting: *Deleted

## 2014-09-12 DIAGNOSIS — Z8546 Personal history of malignant neoplasm of prostate: Secondary | ICD-10-CM | POA: Insufficient documentation

## 2014-09-12 DIAGNOSIS — Z885 Allergy status to narcotic agent status: Secondary | ICD-10-CM | POA: Insufficient documentation

## 2014-09-12 DIAGNOSIS — Z87891 Personal history of nicotine dependence: Secondary | ICD-10-CM | POA: Insufficient documentation

## 2014-09-12 DIAGNOSIS — Z79899 Other long term (current) drug therapy: Secondary | ICD-10-CM | POA: Insufficient documentation

## 2014-09-12 DIAGNOSIS — H2512 Age-related nuclear cataract, left eye: Secondary | ICD-10-CM | POA: Diagnosis not present

## 2014-09-12 DIAGNOSIS — Z9889 Other specified postprocedural states: Secondary | ICD-10-CM | POA: Insufficient documentation

## 2014-09-12 DIAGNOSIS — M1990 Unspecified osteoarthritis, unspecified site: Secondary | ICD-10-CM | POA: Insufficient documentation

## 2014-09-12 DIAGNOSIS — I509 Heart failure, unspecified: Secondary | ICD-10-CM | POA: Insufficient documentation

## 2014-09-12 DIAGNOSIS — Z932 Ileostomy status: Secondary | ICD-10-CM | POA: Insufficient documentation

## 2014-09-12 DIAGNOSIS — Z950 Presence of cardiac pacemaker: Secondary | ICD-10-CM | POA: Insufficient documentation

## 2014-09-12 DIAGNOSIS — I10 Essential (primary) hypertension: Secondary | ICD-10-CM | POA: Insufficient documentation

## 2014-09-12 DIAGNOSIS — Z7982 Long term (current) use of aspirin: Secondary | ICD-10-CM | POA: Insufficient documentation

## 2014-09-12 DIAGNOSIS — E119 Type 2 diabetes mellitus without complications: Secondary | ICD-10-CM | POA: Insufficient documentation

## 2014-09-12 HISTORY — DX: Cardiac arrhythmia, unspecified: I49.9

## 2014-09-12 HISTORY — DX: Presence of cardiac pacemaker: Z95.0

## 2014-09-12 HISTORY — PX: CATARACT EXTRACTION W/PHACO: SHX586

## 2014-09-12 HISTORY — DX: Essential (primary) hypertension: I10

## 2014-09-12 HISTORY — DX: Unspecified osteoarthritis, unspecified site: M19.90

## 2014-09-12 HISTORY — DX: Gastro-esophageal reflux disease without esophagitis: K21.9

## 2014-09-12 HISTORY — DX: Type 2 diabetes mellitus without complications: E11.9

## 2014-09-12 LAB — GLUCOSE, CAPILLARY: GLUCOSE-CAPILLARY: 95 mg/dL (ref 65–99)

## 2014-09-12 SURGERY — PHACOEMULSIFICATION, CATARACT, WITH IOL INSERTION
Anesthesia: Monitor Anesthesia Care | Laterality: Left | Wound class: Clean

## 2014-09-12 MED ORDER — CEFUROXIME OPHTHALMIC INJECTION 1 MG/0.1 ML
INJECTION | OPHTHALMIC | Status: AC
  Filled 2014-09-12: qty 0.1

## 2014-09-12 MED ORDER — EPINEPHRINE HCL 1 MG/ML IJ SOLN
INTRAMUSCULAR | Status: AC
  Filled 2014-09-12: qty 1

## 2014-09-12 MED ORDER — NA CHONDROIT SULF-NA HYALURON 40-17 MG/ML IO SOLN
INTRAOCULAR | Status: AC
  Filled 2014-09-12: qty 1

## 2014-09-12 MED ORDER — MOXIFLOXACIN HCL 0.5 % OP SOLN
1.0000 [drp] | OPHTHALMIC | Status: AC
Start: 2014-09-12 — End: 2014-09-12
  Administered 2014-09-12 (×3): 1 [drp] via OPHTHALMIC

## 2014-09-12 MED ORDER — PHENYLEPHRINE HCL 10 % OP SOLN
OPHTHALMIC | Status: AC
Start: 2014-09-12 — End: 2014-09-12
  Administered 2014-09-12: 1 [drp] via OPHTHALMIC
  Filled 2014-09-12: qty 5

## 2014-09-12 MED ORDER — PHENYLEPHRINE HCL 10 % OP SOLN
1.0000 [drp] | OPHTHALMIC | Status: AC
  Administered 2014-09-12 (×4): 1 [drp] via OPHTHALMIC

## 2014-09-12 MED ORDER — CARBACHOL 0.01 % IO SOLN
INTRAOCULAR | Status: DC | PRN
  Administered 2014-09-12: 0.5 mL via INTRAOCULAR

## 2014-09-12 MED ORDER — MOXIFLOXACIN HCL 0.5 % OP SOLN
OPHTHALMIC | Status: AC
  Administered 2014-09-12: 1 [drp] via OPHTHALMIC
  Filled 2014-09-12: qty 3

## 2014-09-12 MED ORDER — TETRACAINE HCL 0.5 % OP SOLN
OPHTHALMIC | Status: AC
  Filled 2014-09-12: qty 2

## 2014-09-12 MED ORDER — ALFENTANIL 500 MCG/ML IJ INJ
INJECTION | INTRAMUSCULAR | Status: DC | PRN
  Administered 2014-09-12: 500 ug via INTRAVENOUS

## 2014-09-12 MED ORDER — CEFUROXIME OPHTHALMIC INJECTION 1 MG/0.1 ML
INJECTION | OPHTHALMIC | Status: DC | PRN
  Administered 2014-09-12: 0.1 mL via INTRACAMERAL

## 2014-09-12 MED ORDER — HYALURONIDASE HUMAN 150 UNIT/ML IJ SOLN
INTRAMUSCULAR | Status: AC
  Filled 2014-09-12: qty 1

## 2014-09-12 MED ORDER — LIDOCAINE HCL (PF) 4 % IJ SOLN
INTRAMUSCULAR | Status: AC
  Filled 2014-09-12: qty 5

## 2014-09-12 MED ORDER — BUPIVACAINE HCL (PF) 0.75 % IJ SOLN
INTRAMUSCULAR | Status: AC
  Filled 2014-09-12: qty 10

## 2014-09-12 MED ORDER — SODIUM CHLORIDE 0.9 % IV SOLN
INTRAVENOUS | Status: DC
  Administered 2014-09-12: 10:00:00 via INTRAVENOUS

## 2014-09-12 MED ORDER — LIDOCAINE HCL (PF) 4 % IJ SOLN
INTRAOCULAR | Status: DC | PRN
  Administered 2014-09-12: 4 mL via OPHTHALMIC

## 2014-09-12 MED ORDER — MOXIFLOXACIN HCL 0.5 % OP SOLN - NO CHARGE
OPHTHALMIC | Status: DC | PRN
  Administered 2014-09-12: 1 [drp] via OPHTHALMIC

## 2014-09-12 MED ORDER — CYCLOPENTOLATE HCL 2 % OP SOLN
1.0000 [drp] | OPHTHALMIC | Status: AC
  Administered 2014-09-12 (×4): 1 [drp] via OPHTHALMIC

## 2014-09-12 MED ORDER — CYCLOPENTOLATE HCL 2 % OP SOLN
OPHTHALMIC | Status: AC
Start: 2014-09-12 — End: 2014-09-12
  Administered 2014-09-12: 1 [drp] via OPHTHALMIC
  Filled 2014-09-12: qty 2

## 2014-09-12 MED ORDER — BSS IO SOLN
INTRAOCULAR | Status: DC | PRN
  Administered 2014-09-12: 11:00:00

## 2014-09-12 MED ORDER — NA CHONDROIT SULF-NA HYALURON 40-17 MG/ML IO SOLN
INTRAOCULAR | Status: DC | PRN
  Administered 2014-09-12: 1 mL via INTRAOCULAR

## 2014-09-12 MED ORDER — LIDOCAINE HCL (PF) 4 % IJ SOLN
INTRAMUSCULAR | Status: DC | PRN
  Administered 2014-09-12: 5 mL via OPHTHALMIC

## 2014-09-12 MED ORDER — TETRACAINE HCL 0.5 % OP SOLN
OPHTHALMIC | Status: DC | PRN
  Administered 2014-09-12: 1 [drp] via OPHTHALMIC

## 2014-09-12 SURGICAL SUPPLY — 27 items
CANNULA ANT/CHMB 27GA (MISCELLANEOUS) ×3 IMPLANT
CORD BIP STRL DISP 12FT (MISCELLANEOUS) ×3 IMPLANT
CUP MEDICINE 2OZ PLAST GRAD ST (MISCELLANEOUS) ×3 IMPLANT
DRAPE XRAY CASSETTE 23X24 (DRAPES) ×3 IMPLANT
ERASER HMR WETFIELD 18G (MISCELLANEOUS) ×3 IMPLANT
GLOVE BIO SURGEON STRL SZ8 (GLOVE) ×3 IMPLANT
GLOVE SURG LX 6.5 MICRO (GLOVE) ×2
GLOVE SURG LX 8.0 MICRO (GLOVE) ×2
GLOVE SURG LX STRL 6.5 MICRO (GLOVE) ×1 IMPLANT
GLOVE SURG LX STRL 8.0 MICRO (GLOVE) ×1 IMPLANT
GOWN STRL REUS W/ TWL LRG LVL3 (GOWN DISPOSABLE) ×1 IMPLANT
GOWN STRL REUS W/ TWL XL LVL3 (GOWN DISPOSABLE) ×1 IMPLANT
GOWN STRL REUS W/TWL LRG LVL3 (GOWN DISPOSABLE) ×2
GOWN STRL REUS W/TWL XL LVL3 (GOWN DISPOSABLE) ×2
LENS IOL ACRYSOF IQ 20.0 (Intraocular Lens) ×3 IMPLANT
PACK CATARACT (MISCELLANEOUS) ×3 IMPLANT
PACK CATARACT DINGLEDEIN LX (MISCELLANEOUS) ×3 IMPLANT
PACK EYE AFTER SURG (MISCELLANEOUS) ×3 IMPLANT
SHLD EYE VISITEC  UNIV (MISCELLANEOUS) ×3 IMPLANT
SOL PREP PVP 2OZ (MISCELLANEOUS) ×3
SOLUTION PREP PVP 2OZ (MISCELLANEOUS) ×1 IMPLANT
SUT SILK 5-0 (SUTURE) ×6 IMPLANT
SYR 3ML LL SCALE MARK (SYRINGE) ×3 IMPLANT
SYR 5ML LL (SYRINGE) ×3 IMPLANT
SYR TB 1ML 27GX1/2 LL (SYRINGE) ×3 IMPLANT
WATER STERILE IRR 1000ML POUR (IV SOLUTION) ×3 IMPLANT
WIPE NON LINTING 3.25X3.25 (MISCELLANEOUS) ×3 IMPLANT

## 2014-09-12 NOTE — Interval H&P Note (Signed)
History and Physical Interval Note:  09/12/2014 10:44 AM  Bryce Clark  has presented today for surgery, with the diagnosis of CATARACT  The various methods of treatment have been discussed with the patient and family. After consideration of risks, benefits and other options for treatment, the patient has consented to  Procedure(s): CATARACT EXTRACTION PHACO AND INTRAOCULAR LENS PLACEMENT ([place]) (Left) as a surgical intervention .  The patient's history has been reviewed, patient examined, no change in status, stable for surgery.  I have reviewed the patient's chart and labs.  Questions were answered to the patient's satisfaction.     [name]

## 2014-09-12 NOTE — H&P (Signed)
  History and physical was faxed and scanned in.   

## 2014-09-12 NOTE — Op Note (Signed)
Date of Surgery: 09/12/2014 Date of Dictation: 09/12/2014 11:21 AM Pre-operative Diagnosis:  Nuclear sclerotic cataract,  left Eye Post-operative Diagnosis: same Procedure performed: Extra-capsular Cataract Extraction (ECCE) with placement of a posterior chamber intraocular lens (IOL) left Eye IOL:  Implant Name Type Inv. Item Serial No. Manufacturer Lot No. LRB No. Used  UltraSert     [id number] ALCON   Left 1   Anesthesia: 2% Lidocaine and 4% Marcaine in a 50/50 mixture with 10 unites/ml of Hylenex given as a peribulbar Anesthesiologist: Anesthesiologist: Molli Barrows, MD CRNA: Bernardo Heater, CRNA Complications: none Estimated Blood Loss: less than 1 ml  Description of procedure:  The patient was given anesthesia and sedation via intravenous access. The patient was then prepped and draped in the usual fashion. A 25-gauge needle was bent for initiating the capsulorhexis. A 5-0 silk suture was placed through the conjunctiva superior and inferiorly to serve as bridle sutures. Hemostasis was obtained at the superior limbus using an eraser cautery. A partial thickness groove was made at the anterior surgical limbus with a 64 Beaver blade and this was dissected anteriorly with an [employer]. The anterior chamber was entered at 10 o'clock with a 1.0 mm paracentesis knife and through the lamellar dissection with a 2.6 mm Alcon keratome. DiscoVisc was injected to replace the aqueous and a continuous tear curvilinear capsulorhexis was performed using a bent 25-gauge needle.  Balance salt on a syringe was used to perform hydro-dissection and phacoemulsification was carried out using a divide and conquer technique. Procedure(s) with comments: CATARACT EXTRACTION PHACO AND INTRAOCULAR LENS PLACEMENT ([facility]) (Left) - [place]: 01:11.7 AP: 25.6 CDE: 29.48 . Irrigation/aspiration was used to remove the residual cortex and the capsular bag was inflated with DiscoVisc. The intraocular lens was inserted into  the capsular bag using a pre-loaded UltraSert Delivery System. Irrigation/aspiration was used to remove the residual DiscoVisc. The wound was inflated with balanced salt and checked for leaks. None were found. Miostat was injected via the paracentesis track and 0.1 ml of cefuroxime containing 1 mg of drug  was injected via the paracentesis track. The wound was checked for leaks again and none were found.   The bridal sutures were removed and two drops of Vigamox were placed on the eye. An eye shield was placed to protect the eye and the patient was discharged to the recovery area in good condition.   Keyry Iracheta MD

## 2014-09-12 NOTE — Discharge Instructions (Addendum)
See handout. Eye Surgery Discharge Instructions  Expect mild scratchy sensation or mild soreness. DO NOT RUB YOUR EYE!  The day of surgery:  Minimal physical activity, but bed rest is not required  No reading, computer work, or close hand work  No bending, lifting, or straining.  May watch TV  For 24 hours:  No driving, legal decisions, or alcoholic beverages  Safety precautions  Eat anything you prefer: It is better to start with liquids, then soup then solid foods.  _____ Eye patch should be worn until postoperative exam tomorrow.  ____ Solar shield eyeglasses should be worn for comfort in the sunlight/patch while sleeping  Resume all regular medications including aspirin or Coumadin if these were discontinued prior to surgery. You may shower, bathe, shave, or wash your hair. Tylenol may be taken for mild discomfort.  Call your doctor if you experience significant pain, nausea, or vomiting, fever > 101 or other signs of infection. [phone] or [phone] Specific instructions:  Follow-up Information    Follow up with Estill Cotta, MD On 09/13/2014.   Specialty:  Ophthalmology   Why:  10:40am   Contact information:   [place] [address] [phone]     AMBULATORY SURGERY  DISCHARGE INSTRUCTIONS   1) The drugs that you were given will stay in your system until tomorrow so for the next 24 hours you should not:  A) Drive an automobile B) Make any legal decisions C) Drink any alcoholic beverage   2) You may resume regular meals tomorrow.  Today it is better to start with liquids and gradually work up to solid foods.  You may eat anything you prefer, but it is better to start with liquids, then soup and crackers, and gradually work up to solid foods.   3) Please notify your doctor immediately if you have any unusual bleeding, trouble breathing, redness and pain at the surgery site, drainage, fever, or pain not relieved by  medication. 4)   5) Your post-operative visit with Dr.                                     is: Date:                        Time:    Please call to schedule your post-operative visit.  6) Additional Instructions:

## 2014-09-12 NOTE — OR Nursing (Signed)
[place] LOT: [id number]

## 2014-09-12 NOTE — Anesthesia Postprocedure Evaluation (Signed)
  Anesthesia Post-op Note  Patient: Bryce Clark  Procedure(s) Performed: Procedure(s) with comments: CATARACT EXTRACTION PHACO AND INTRAOCULAR LENS PLACEMENT ([place]) (Left) - [place]: 01:11.7 AP: 25.6 CDE: 29.48   Anesthesia type:MAC  Patient location: PACU  Post pain: Pain level controlled  Post assessment: Post-op Vital signs reviewed, Patient's Cardiovascular Status Stable, Respiratory Function Stable, Patent Airway and No signs of Nausea or vomiting  Post vital signs: Reviewed and stable  Last Vitals:  Filed Vitals:   09/12/14 1124  BP: 182/94  Pulse:   Temp: 35.6 C  Resp: 16    Level of consciousness: awake, alert  and patient cooperative  Complications: No apparent anesthesia complications

## 2014-09-12 NOTE — Transfer of Care (Signed)
Immediate Anesthesia Transfer of Care Note  Patient: Bryce Clark  Procedure(s) Performed: Procedure(s) with comments: CATARACT EXTRACTION PHACO AND INTRAOCULAR LENS PLACEMENT ([place]) (Left) - [place]: 01:11.7 AP: 25.6 CDE: 29.48   Patient Location: PACU  Anesthesia Type:MAC  Level of Consciousness: awake, alert  and oriented  Airway & Oxygen Therapy: Patient Spontanous Breathing  Post-op Assessment: Report given to RN and Post -op Vital signs reviewed and stable  Post vital signs: Reviewed and stable  Last Vitals:  Filed Vitals:   09/12/14 1124  BP: 182/94  Pulse:   Temp: 35.6 C  Resp: 16    Complications: No apparent anesthesia complications

## 2014-09-12 NOTE — Anesthesia Preprocedure Evaluation (Signed)
Anesthesia Evaluation  Patient identified by MRN, date of birth, ID band Patient awake    Reviewed: Allergy & Precautions, H&P , NPO status , Patient's Chart, lab work & pertinent test results, reviewed documented beta blocker date and time   Airway Mallampati: II  TM Distance: >3 FB Neck ROM: full    Dental no notable dental hx.    Pulmonary neg pulmonary ROS, former smoker,  breath sounds clear to auscultation  Pulmonary exam normal       Cardiovascular Exercise Tolerance: Good hypertension, +CHF negative cardio ROS  + dysrhythmias + pacemaker Rhythm:regular Rate:Normal     Neuro/Psych negative neurological ROS  negative psych ROS   GI/Hepatic negative GI ROS, Neg liver ROS, GERD-  ,  Endo/Other  negative endocrine ROSdiabetes  Renal/GU negative Renal ROS  negative genitourinary   Musculoskeletal   Abdominal   Peds  Hematology negative hematology ROS (+)   Anesthesia Other Findings   Reproductive/Obstetrics negative OB ROS                             Anesthesia Physical Anesthesia Plan  ASA: III  Anesthesia Plan: MAC   Post-op Pain Management:    Induction:   Airway Management Planned:   Additional Equipment:   Intra-op Plan:   Post-operative Plan:   Informed Consent: I have reviewed the patients History and Physical, chart, labs and discussed the procedure including the risks, benefits and alternatives for the proposed anesthesia with the patient or authorized representative who has indicated his/her understanding and acceptance.   Dental Advisory Given  Plan Discussed with: CRNA  Anesthesia Plan Comments:         Anesthesia Quick Evaluation

## 2014-10-03 ENCOUNTER — Other Ambulatory Visit: Payer: Self-pay

## 2014-10-03 ENCOUNTER — Ambulatory Visit: Payer: Self-pay | Admitting: Oncology

## 2014-10-04 ENCOUNTER — Encounter
Admission: RE | Admit: 2014-10-04 | Discharge: 2014-10-04 | Disposition: A | Discharge status: Home / Self Care | Payer: Medicare HMO | Source: Ambulatory Visit | Attending: Ophthalmology | Admitting: Ophthalmology

## 2014-10-04 DIAGNOSIS — Z01812 Encounter for preprocedural laboratory examination: Secondary | ICD-10-CM | POA: Diagnosis present

## 2014-10-04 LAB — POTASSIUM: POTASSIUM: 4.7 mmol/L (ref 3.5–5.1)

## 2014-10-13 DIAGNOSIS — I4891 Unspecified atrial fibrillation: Secondary | ICD-10-CM | POA: Diagnosis not present

## 2014-10-13 DIAGNOSIS — H269 Unspecified cataract: Secondary | ICD-10-CM | POA: Diagnosis present

## 2014-10-13 DIAGNOSIS — Z950 Presence of cardiac pacemaker: Secondary | ICD-10-CM | POA: Diagnosis not present

## 2014-10-13 DIAGNOSIS — I10 Essential (primary) hypertension: Secondary | ICD-10-CM | POA: Diagnosis not present

## 2014-10-13 DIAGNOSIS — Z87891 Personal history of nicotine dependence: Secondary | ICD-10-CM | POA: Diagnosis not present

## 2014-10-13 DIAGNOSIS — H2511 Age-related nuclear cataract, right eye: Secondary | ICD-10-CM | POA: Diagnosis not present

## 2014-10-13 DIAGNOSIS — I509 Heart failure, unspecified: Secondary | ICD-10-CM | POA: Diagnosis not present

## 2014-10-13 DIAGNOSIS — E119 Type 2 diabetes mellitus without complications: Secondary | ICD-10-CM | POA: Diagnosis not present

## 2014-10-13 DIAGNOSIS — Z932 Ileostomy status: Secondary | ICD-10-CM | POA: Diagnosis not present

## 2014-10-13 DIAGNOSIS — K219 Gastro-esophageal reflux disease without esophagitis: Secondary | ICD-10-CM | POA: Diagnosis not present

## 2014-10-13 NOTE — OR Nursing (Signed)
Notified Cindy at [facility] eye regarding warning notification when entering in Cyclogel, patient with codeine allergy. Per Jenny Reichmann Cyclogel used during previous catatact surgery with no problems.

## 2014-10-16 NOTE — H&P (Signed)
  History and physical was faxed and scanned in.   

## 2014-10-17 ENCOUNTER — Encounter
Admission: RE | Disposition: A | Discharge status: Home / Self Care | Payer: Self-pay | Source: Ambulatory Visit | Attending: Ophthalmology

## 2014-10-17 ENCOUNTER — Encounter: Payer: Self-pay | Admitting: *Deleted

## 2014-10-17 ENCOUNTER — Ambulatory Visit: Payer: Medicare HMO | Admitting: *Deleted

## 2014-10-17 ENCOUNTER — Ambulatory Visit
Admission: RE | Admit: 2014-10-17 | Discharge: 2014-10-17 | Disposition: A | Discharge status: Home / Self Care | Payer: Medicare HMO | Source: Ambulatory Visit | Attending: Ophthalmology | Admitting: Ophthalmology

## 2014-10-17 DIAGNOSIS — Z932 Ileostomy status: Secondary | ICD-10-CM | POA: Insufficient documentation

## 2014-10-17 DIAGNOSIS — Z950 Presence of cardiac pacemaker: Secondary | ICD-10-CM | POA: Insufficient documentation

## 2014-10-17 DIAGNOSIS — H2511 Age-related nuclear cataract, right eye: Secondary | ICD-10-CM | POA: Diagnosis not present

## 2014-10-17 DIAGNOSIS — I10 Essential (primary) hypertension: Secondary | ICD-10-CM | POA: Insufficient documentation

## 2014-10-17 DIAGNOSIS — E119 Type 2 diabetes mellitus without complications: Secondary | ICD-10-CM | POA: Insufficient documentation

## 2014-10-17 DIAGNOSIS — Z87891 Personal history of nicotine dependence: Secondary | ICD-10-CM | POA: Insufficient documentation

## 2014-10-17 DIAGNOSIS — I4891 Unspecified atrial fibrillation: Secondary | ICD-10-CM | POA: Insufficient documentation

## 2014-10-17 DIAGNOSIS — K219 Gastro-esophageal reflux disease without esophagitis: Secondary | ICD-10-CM | POA: Insufficient documentation

## 2014-10-17 DIAGNOSIS — I509 Heart failure, unspecified: Secondary | ICD-10-CM | POA: Insufficient documentation

## 2014-10-17 HISTORY — PX: CATARACT EXTRACTION W/PHACO: SHX586

## 2014-10-17 LAB — GLUCOSE, CAPILLARY: GLUCOSE-CAPILLARY: 101 mg/dL — AB (ref 65–99)

## 2014-10-17 SURGERY — PHACOEMULSIFICATION, CATARACT, WITH IOL INSERTION
Anesthesia: Monitor Anesthesia Care | Site: Eye | Laterality: Right | Wound class: Clean

## 2014-10-17 MED ORDER — NA CHONDROIT SULF-NA HYALURON 40-17 MG/ML IO SOLN
INTRAOCULAR | Status: AC
  Filled 2014-10-17: qty 1

## 2014-10-17 MED ORDER — BUPIVACAINE HCL (PF) 0.75 % IJ SOLN
INTRAMUSCULAR | Status: AC
  Filled 2014-10-17: qty 10

## 2014-10-17 MED ORDER — MIDAZOLAM HCL 2 MG/2ML IJ SOLN
INTRAMUSCULAR | Status: DC | PRN
  Administered 2014-10-17: 1 mg via INTRAVENOUS

## 2014-10-17 MED ORDER — LIDOCAINE HCL (PF) 4 % IJ SOLN
INTRAMUSCULAR | Status: AC
  Filled 2014-10-17: qty 5

## 2014-10-17 MED ORDER — MOXIFLOXACIN HCL 0.5 % OP SOLN
OPHTHALMIC | Status: AC
  Administered 2014-10-17: 1 [drp] via OPHTHALMIC
  Filled 2014-10-17: qty 3

## 2014-10-17 MED ORDER — PHENYLEPHRINE HCL 10 % OP SOLN
1.0000 [drp] | OPHTHALMIC | Status: DC | PRN
  Administered 2014-10-17: 1 [drp] via OPHTHALMIC

## 2014-10-17 MED ORDER — SODIUM CHLORIDE 0.9 % IV SOLN
INTRAVENOUS | Status: DC
  Administered 2014-10-17: 08:00:00 via INTRAVENOUS

## 2014-10-17 MED ORDER — MOXIFLOXACIN HCL 0.5 % OP SOLN - NO CHARGE
OPHTHALMIC | Status: DC | PRN
  Administered 2014-10-17: 1 [drp] via OPHTHALMIC

## 2014-10-17 MED ORDER — LIDOCAINE HCL (PF) 1 % IJ SOLN
INTRAOCULAR | Status: DC | PRN
  Administered 2014-10-17: 09:00:00 via OPHTHALMIC

## 2014-10-17 MED ORDER — PHENYLEPHRINE HCL 10 % OP SOLN
OPHTHALMIC | Status: AC
  Administered 2014-10-17: 1 [drp] via OPHTHALMIC
  Filled 2014-10-17: qty 5

## 2014-10-17 MED ORDER — EPINEPHRINE HCL 1 MG/ML IJ SOLN
INTRAMUSCULAR | Status: AC
  Filled 2014-10-17: qty 1

## 2014-10-17 MED ORDER — CEFUROXIME OPHTHALMIC INJECTION 1 MG/0.1 ML
INJECTION | OPHTHALMIC | Status: DC | PRN
  Administered 2014-10-17: 0.1 mL via INTRACAMERAL

## 2014-10-17 MED ORDER — TETRACAINE HCL 0.5 % OP SOLN
OPHTHALMIC | Status: DC | PRN
  Administered 2014-10-17: 1 [drp] via OPHTHALMIC

## 2014-10-17 MED ORDER — EPINEPHRINE HCL 1 MG/ML IJ SOLN
INTRAOCULAR | Status: DC | PRN
  Administered 2014-10-17: 200 mL

## 2014-10-17 MED ORDER — CARBACHOL 0.01 % IO SOLN
INTRAOCULAR | Status: DC | PRN
  Administered 2014-10-17: 0.5 mL via INTRAOCULAR

## 2014-10-17 MED ORDER — ALFENTANIL 500 MCG/ML IJ INJ
INJECTION | INTRAMUSCULAR | Status: DC | PRN
  Administered 2014-10-17: 500 ug via INTRAVENOUS

## 2014-10-17 MED ORDER — CEFUROXIME OPHTHALMIC INJECTION 1 MG/0.1 ML
INJECTION | OPHTHALMIC | Status: AC
  Filled 2014-10-17: qty 0.1

## 2014-10-17 MED ORDER — HYALURONIDASE HUMAN 150 UNIT/ML IJ SOLN
INTRAMUSCULAR | Status: AC
  Filled 2014-10-17: qty 1

## 2014-10-17 MED ORDER — MOXIFLOXACIN HCL 0.5 % OP SOLN
1.0000 [drp] | OPHTHALMIC | Status: DC | PRN
Start: 2014-10-17 — End: 2014-10-17
  Administered 2014-10-17: 1 [drp] via OPHTHALMIC

## 2014-10-17 MED ORDER — CYCLOPENTOLATE HCL 2 % OP SOLN
OPHTHALMIC | Status: AC
  Administered 2014-10-17: 1 [drp] via OPHTHALMIC
  Filled 2014-10-17: qty 2

## 2014-10-17 MED ORDER — TETRACAINE HCL 0.5 % OP SOLN
OPHTHALMIC | Status: AC
  Filled 2014-10-17: qty 2

## 2014-10-17 MED ORDER — CYCLOPENTOLATE HCL 2 % OP SOLN
1.0000 [drp] | OPHTHALMIC | Status: DC | PRN
  Administered 2014-10-17: 1 [drp] via OPHTHALMIC

## 2014-10-17 MED ORDER — NA CHONDROIT SULF-NA HYALURON 40-17 MG/ML IO SOLN
INTRAOCULAR | Status: DC | PRN
Start: 2014-10-17 — End: 2014-10-17
  Administered 2014-10-17: 1 mL via INTRAOCULAR

## 2014-10-17 SURGICAL SUPPLY — 24 items
CORD BIP STRL DISP 12FT (MISCELLANEOUS) ×3 IMPLANT
DRAPE XRAY CASSETTE 23X24 (DRAPES) ×3 IMPLANT
ERASER HMR WETFIELD 18G (MISCELLANEOUS) ×3 IMPLANT
GLOVE BIO SURGEON STRL SZ8 (GLOVE) ×3 IMPLANT
GLOVE SURG LX 6.5 MICRO (GLOVE) ×2
GLOVE SURG LX 8.0 MICRO (GLOVE) ×2
GLOVE SURG LX STRL 6.5 MICRO (GLOVE) ×1 IMPLANT
GLOVE SURG LX STRL 8.0 MICRO (GLOVE) ×1 IMPLANT
GOWN STRL REUS W/ TWL LRG LVL3 (GOWN DISPOSABLE) ×1 IMPLANT
GOWN STRL REUS W/ TWL XL LVL3 (GOWN DISPOSABLE) ×1 IMPLANT
GOWN STRL REUS W/TWL LRG LVL3 (GOWN DISPOSABLE) ×2
GOWN STRL REUS W/TWL XL LVL3 (GOWN DISPOSABLE) ×2
LENS IOL ACRYSOF IQ 20.0 (Intraocular Lens) ×3 IMPLANT
PACK CATARACT (MISCELLANEOUS) ×3 IMPLANT
PACK CATARACT DINGLEDEIN LX (MISCELLANEOUS) ×3 IMPLANT
PACK EYE AFTER SURG (MISCELLANEOUS) ×3 IMPLANT
SHLD EYE VISITEC  UNIV (MISCELLANEOUS) ×3 IMPLANT
SOL PREP PVP 2OZ (MISCELLANEOUS) ×3
SOLUTION PREP PVP 2OZ (MISCELLANEOUS) ×1 IMPLANT
SUT SILK 5-0 (SUTURE) ×3 IMPLANT
SYR 5ML LL (SYRINGE) ×3 IMPLANT
SYR TB 1ML 27GX1/2 LL (SYRINGE) ×3 IMPLANT
WATER STERILE IRR 1000ML POUR (IV SOLUTION) ×3 IMPLANT
WIPE NON LINTING 3.25X3.25 (MISCELLANEOUS) ×3 IMPLANT

## 2014-10-17 NOTE — Anesthesia Preprocedure Evaluation (Signed)
Anesthesia Evaluation  Patient identified by MRN, date of birth, ID band Patient awake    Reviewed: Allergy & Precautions, NPO status , Patient's Chart, lab work & pertinent test results  Airway Mallampati: II  TM Distance: >3 FB Neck ROM: Limited    Dental  (+) Upper Dentures, Lower Dentures   Pulmonary former smoker,    Pulmonary exam normal       Cardiovascular Exercise Tolerance: Poor hypertension, Pt. on medications and Pt. on home beta blockers +CHF and + DOE + dysrhythmias Atrial Fibrillation + pacemaker Rhythm:Regular Rate:Normal     Neuro/Psych    GI/Hepatic GERD-  Medicated and Controlled,  Endo/Other  diabetes, Type 2  Renal/GU      Musculoskeletal   Abdominal Normal abdominal exam  (+)   Peds  Hematology   Anesthesia Other Findings   Reproductive/Obstetrics                             Anesthesia Physical Anesthesia Plan  ASA: IV  Anesthesia Plan: MAC   Post-op Pain Management:    Induction: Intravenous  Airway Management Planned: Nasal Cannula  Additional Equipment:   Intra-op Plan:   Post-operative Plan:   Informed Consent: I have reviewed the patients History and Physical, chart, labs and discussed the procedure including the risks, benefits and alternatives for the proposed anesthesia with the patient or authorized representative who has indicated his/her understanding and acceptance.     Plan Discussed with: CRNA  Anesthesia Plan Comments:         Anesthesia Quick Evaluation

## 2014-10-17 NOTE — Transfer of Care (Signed)
Immediate Anesthesia Transfer of Care Note  Patient: Bryce Clark  Procedure(s) Performed: Procedure(s) with comments: CATARACT EXTRACTION PHACO AND INTRAOCULAR LENS PLACEMENT ([facility]) (Right) - [place] 01:47 AP%61.8 CDE49.41 fluid pack lot # [id number]  Patient Location: PACU  Anesthesia Type:MAC  Level of Consciousness: awake  Airway & Oxygen Therapy: Patient Spontanous Breathing and Patient connected to nasal cannula oxygen  Post-op Assessment: Report given to RN and Post -op Vital signs reviewed and stable  Post vital signs: Reviewed and stable  Last Vitals:  Filed Vitals:   10/17/14 0915  BP: 171/84  Pulse: 57  Temp: 36.2 C  Resp: 16    Complications: No apparent anesthesia complications

## 2014-10-17 NOTE — Addendum Note (Signed)
Addendum  created 10/17/14 1040 by Allean Found, CRNA   Modules edited: Anesthesia Attestations

## 2014-10-17 NOTE — Interval H&P Note (Signed)
History and Physical Interval Note:  10/17/2014 8:21 AM  Bryce Clark  has presented today for surgery, with the diagnosis of cataract  The various methods of treatment have been discussed with the patient and family. After consideration of risks, benefits and other options for treatment, the patient has consented to  Procedure(s): CATARACT EXTRACTION PHACO AND INTRAOCULAR LENS PLACEMENT ([place]) (Right) as a surgical intervention .  The patient's history has been reviewed, patient examined, no change in status, stable for surgery.  I have reviewed the patient's chart and labs.  Questions were answered to the patient's satisfaction.     [name]

## 2014-10-17 NOTE — Discharge Instructions (Signed)
Cataract Surgery °Care After °Refer to this sheet in the next few weeks. These instructions provide you with information on caring for yourself after your procedure. Your caregiver may also give you more specific instructions. Your treatment has been planned according to current medical practices, but problems sometimes occur. Call your caregiver if you have any problems or questions after your procedure.  °HOME CARE INSTRUCTIONS  °· Avoid strenuous activities as directed by your caregiver. °· Ask your caregiver when you can resume driving. °· Use eyedrops or other medicines to help healing and control pressure inside your eye as directed by your caregiver. °· Only take over-the-counter or prescription medicines for pain, discomfort, or fever as directed by your caregiver. °· Do not to touch or rub your eyes. °· You may be instructed to use a protective shield during the first few days and nights after surgery. If not, wear sunglasses to protect your eyes. This is to protect the eye from pressure or from being accidentally bumped. °· Keep the area around your eye clean and dry. Avoid swimming or allowing water to hit you directly in the face while showering. Keep soap and shampoo out of your eyes. °· Do not bend or lift heavy objects. Bending increases pressure in the eye. You can walk, climb stairs, and do light household chores. °· Do not put a contact lens into the eye that had surgery until your caregiver says it is okay to do so. °· Ask your doctor when you can return to work. This will depend on the kind of work that you do. If you work in a dusty environment, you may be advised to wear protective eyewear for a period of time. °· Ask your caregiver when it will be safe to engage in sexual activity. °· Continue with your regular eye exams as directed by your caregiver. °What to expect: °· It is normal to feel itching and mild discomfort for a few days after cataract surgery. Some fluid discharge is also common,  and your eye may be sensitive to light and touch. °· After 1 to 2 days, even moderate discomfort should disappear. In most cases, healing will take about 6 weeks. °· If you received an intraocular lens (IOL), you may notice that colors are very bright or have a blue tinge. Also, if you have been in bright sunlight, everything may appear reddish for a few hours. If you see these color tinges, it is because your lens is clear and no longer cloudy. Within a few months after receiving an IOL, these extra colors should go away. When you have healed, you will probably need new glasses. °SEEK MEDICAL CARE IF:  °· You have increased bruising around your eye. °· You have discomfort not helped by medicine. °SEEK IMMEDIATE MEDICAL CARE IF:  °· You have a  fever. °· You have a worsening or sudden vision loss. °· You have redness, swelling, or increasing pain in the eye. °· You have a thick discharge from the eye that had surgery. °MAKE SURE YOU: °· Understand these instructions. °· Will watch your condition. °· Will get help right away if you are not doing well or get worse. °Document Released: 10/12/2004 Document Revised: 06/17/2011 Document Reviewed: 11/16/2010 °ExitCare® Patient Information ©2015 [employer]. This information is not intended to replace advice given to you by your health care provider. Make sure you discuss any questions you have with your health care provider. ° °

## 2014-10-17 NOTE — Anesthesia Postprocedure Evaluation (Signed)
  Anesthesia Post-op Note  Patient: Pierce Crane Huseman  Procedure(s) Performed: Procedure(s) with comments: CATARACT EXTRACTION PHACO AND INTRAOCULAR LENS PLACEMENT ([facility]) (Right) - [place] 01:47 AP%61.8 CDE49.41 fluid pack lot # [id number]  Anesthesia type:MAC  Patient location: PACU  Post pain: Pain level controlled  Post assessment: Post-op Vital signs reviewed, Patient's Cardiovascular Status Stable, Respiratory Function Stable, Patent Airway and No signs of Nausea or vomiting  Post vital signs: Reviewed and stable  Last Vitals:  Filed Vitals:   10/17/14 0915  BP: 171/84  Pulse: 57  Temp: 36.2 C  Resp: 16    Level of consciousness: awake, alert  and patient cooperative  Complications: No apparent anesthesia complications

## 2014-10-17 NOTE — Op Note (Signed)
Date of Surgery: 10/17/2014 Date of Dictation: 10/17/2014 9:12 AM Pre-operative Diagnosis:  Nuclear Sclerotic Cataract right Eye Post-operative Diagnosis: same Procedure performed: Extra-capsular Cataract Extraction (ECCE) with placement of a posterior chamber intraocular lens (IOL) right Eye IOL:  Implant Name Type Inv. Item Serial No. Manufacturer Lot No. LRB No. Used  [id number] 20.0     [id number]     Right 1   Anesthesia: 2% Lidocaine and 4% Marcaine in a 50/50 mixture with 10 unites/ml of Hylenex given as a peribulbar Anesthesiologist: Anesthesiologist: Elyse Hsu, MD CRNA: Allean Found, CRNA Complications: none Estimated Blood Loss: less than 1 ml  Description of procedure:  The patient was given anesthesia and sedation via intravenous access. The patient was then prepped and draped in the usual fashion. A 25-gauge needle was bent for initiating the capsulorhexis. A 5-0 silk suture was placed through the conjunctiva superior and inferiorly to serve as bridle sutures. Hemostasis was obtained at the superior limbus using an eraser cautery. A partial thickness groove was made at the anterior surgical limbus with a 64 Beaver blade and this was dissected anteriorly with an [employer]. The anterior chamber was entered at 10 o'clock with a 1.0 mm paracentesis knife and through the lamellar dissection with a 2.6 mm Alcon keratome. DiscoVisc was injected to replace the aqueous and a continuous tear curvilinear capsulorhexis was performed using a bent 25-gauge needle.  Balance salt on a syringe was used to perform hydro-dissection and phacoemulsification was carried out using a divide and conquer technique. Procedure(s) with comments: CATARACT EXTRACTION PHACO AND INTRAOCULAR LENS PLACEMENT ([facility]) (Right) - [place] 01:47 AP%61.8 CDE49.41 fluid pack lot # [id number]. Irrigation/aspiration was used to remove the residual cortex and the capsular bag was inflated with DiscoVisc. The intraocular lens was  inserted into the capsular bag using a pre-loaded UltraSert Delivery System. Irrigation/aspiration was used to remove the residual DiscoVisc. The wound was inflated with balanced salt and checked for leaks. None were found. Miostat was injected via the paracentesis track and 0.1 ml of cefuroxime containing 1 mg of drug  was injected via the paracentesis track. The wound was checked for leaks again and none were found.   The bridal sutures were removed and two drops of Vigamox were placed on the eye. An eye shield was placed to protect the eye and the patient was discharged to the recovery area in good condition.   Makaiah Terwilliger MD

## 2014-10-17 NOTE — Anesthesia Procedure Notes (Signed)
Procedure Name: MAC Date/Time: 10/17/2014 8:40 AM Performed by: Aloysuis Ribaudo Pre-anesthesia Checklist: Patient identified, Emergency Drugs available, Suction available, Patient being monitored and Timeout performed Patient Re-evaluated:Patient Re-evaluated prior to inductionOxygen Delivery Method: Nasal cannula

## 2014-10-26 ENCOUNTER — Other Ambulatory Visit: Payer: Self-pay

## 2014-10-26 ENCOUNTER — Encounter: Payer: Self-pay | Admitting: Oncology

## 2014-10-26 ENCOUNTER — Inpatient Hospital Stay: Payer: Medicare HMO

## 2014-10-26 ENCOUNTER — Inpatient Hospital Stay: Payer: Medicare HMO | Attending: Oncology | Admitting: Oncology

## 2014-10-26 VITALS — BP 167/77 | HR 78 | Temp 98.4°F | Resp 18 | Wt 171.1 lb

## 2014-10-26 DIAGNOSIS — C61 Malignant neoplasm of prostate: Secondary | ICD-10-CM

## 2014-10-26 DIAGNOSIS — E119 Type 2 diabetes mellitus without complications: Secondary | ICD-10-CM | POA: Insufficient documentation

## 2014-10-26 DIAGNOSIS — I129 Hypertensive chronic kidney disease with stage 1 through stage 4 chronic kidney disease, or unspecified chronic kidney disease: Secondary | ICD-10-CM | POA: Diagnosis not present

## 2014-10-26 DIAGNOSIS — Z8546 Personal history of malignant neoplasm of prostate: Secondary | ICD-10-CM | POA: Insufficient documentation

## 2014-10-26 DIAGNOSIS — I509 Heart failure, unspecified: Secondary | ICD-10-CM | POA: Diagnosis not present

## 2014-10-26 DIAGNOSIS — Z79899 Other long term (current) drug therapy: Secondary | ICD-10-CM

## 2014-10-26 DIAGNOSIS — N189 Chronic kidney disease, unspecified: Secondary | ICD-10-CM | POA: Insufficient documentation

## 2014-10-26 DIAGNOSIS — N289 Disorder of kidney and ureter, unspecified: Secondary | ICD-10-CM | POA: Insufficient documentation

## 2014-10-26 DIAGNOSIS — C679 Malignant neoplasm of bladder, unspecified: Secondary | ICD-10-CM | POA: Diagnosis present

## 2014-10-26 DIAGNOSIS — D61818 Other pancytopenia: Secondary | ICD-10-CM | POA: Diagnosis not present

## 2014-10-26 DIAGNOSIS — Z950 Presence of cardiac pacemaker: Secondary | ICD-10-CM | POA: Insufficient documentation

## 2014-10-26 DIAGNOSIS — Z87891 Personal history of nicotine dependence: Secondary | ICD-10-CM | POA: Insufficient documentation

## 2014-10-26 LAB — COMPREHENSIVE METABOLIC PANEL
ALK PHOS: 58 U/L (ref 38–126)
ALT: 14 U/L — ABNORMAL LOW (ref 17–63)
AST: 20 U/L (ref 15–41)
Albumin: 4.2 g/dL (ref 3.5–5.0)
Anion gap: 6 (ref 5–15)
BUN: 29 mg/dL — ABNORMAL HIGH (ref 6–20)
CO2: 24 mmol/L (ref 22–32)
CREATININE: 1.66 mg/dL — AB (ref 0.61–1.24)
Calcium: 9.1 mg/dL (ref 8.9–10.3)
Chloride: 106 mmol/L (ref 101–111)
GFR calc Af Amer: 43 mL/min — ABNORMAL LOW (ref >60)
GFR calc non Af Amer: 37 mL/min — ABNORMAL LOW (ref >60)
Glucose, Bld: 136 mg/dL — ABNORMAL HIGH (ref 65–99)
POTASSIUM: 4.4 mmol/L (ref 3.5–5.1)
Sodium: 136 mmol/L (ref 135–145)
Total Bilirubin: 0.7 mg/dL (ref 0.3–1.2)
Total Protein: 7 g/dL (ref 6.5–8.1)

## 2014-10-26 LAB — CBC WITH DIFFERENTIAL/PLATELET
BASOS PCT: 1 %
Basophils Absolute: 0.1 10*3/uL (ref 0–0.1)
EOS ABS: 0.3 10*3/uL (ref 0–0.7)
Eosinophils Relative: 4 %
HCT: 39.8 % — ABNORMAL LOW (ref 40.0–52.0)
HEMOGLOBIN: 12.9 g/dL — AB (ref 13.0–18.0)
LYMPHS ABS: 0.9 10*3/uL — AB (ref 1.0–3.6)
Lymphocytes Relative: 12 %
MCH: 31 pg (ref 26.0–34.0)
MCHC: 32.4 g/dL (ref 32.0–36.0)
MCV: 95.7 fL (ref 80.0–100.0)
MONOS PCT: 8 %
Monocytes Absolute: 0.6 10*3/uL (ref 0.2–1.0)
NEUTROS PCT: 75 %
Neutro Abs: 5.6 10*3/uL (ref 1.4–6.5)
Platelets: 133 10*3/uL — ABNORMAL LOW (ref 150–440)
RBC: 4.16 MIL/uL — ABNORMAL LOW (ref 4.40–5.90)
RDW: 16 % — AB (ref 11.5–14.5)
WBC: 7.4 10*3/uL (ref 3.8–10.6)

## 2014-10-26 MED ORDER — HEPARIN SOD (PORK) LOCK FLUSH 100 UNIT/ML IV SOLN
INTRAVENOUS | Status: AC
  Filled 2014-10-26: qty 5

## 2014-10-26 MED ORDER — HEPARIN SOD (PORK) LOCK FLUSH 100 UNIT/ML IV SOLN
500.0000 [IU] | Freq: Once | INTRAVENOUS | Status: AC
  Administered 2014-10-26: 500 [IU] via INTRAVENOUS

## 2014-10-26 MED ORDER — SODIUM CHLORIDE 0.9 % IJ SOLN
10.0000 mL | INTRAMUSCULAR | Status: AC | PRN
  Administered 2014-10-26: 10 mL via INTRAVENOUS
  Filled 2014-10-26: qty 10

## 2014-11-06 DIAGNOSIS — C679 Malignant neoplasm of bladder, unspecified: Secondary | ICD-10-CM | POA: Insufficient documentation

## 2014-11-06 NOTE — Progress Notes (Signed)
[place]  [phone] [phone]  ID: Duell Holdren Lonigro OB: [birth year]  MR#: [id number]  AOZ#:[id number]  Patient Care Team: Jodi Marble, MD as PCP - General (Internal Medicine)  CHIEF COMPLAINT:  Chief Complaint  Patient presents with  . Follow-up    bladder cancer    INTERVAL HISTORY: Patient returns to clinic today for repeat laboratory work and further evaluation. He currently feels well and is asymptomatic. He does not complain of abdominal pain or bloating today. He denies any easy bleeding or bruising.  He has no neurologic complaints. He denies any recent fevers. He has a good appetite and denies weight loss. He has no chest pain or shortness of breath. He denies any nausea, vomiting, constipation, or diarrhea. Patient offers no specific complaints today.  REVIEW OF SYSTEMS:   Review of Systems  Constitutional: Negative.   Gastrointestinal: Negative.   Genitourinary: Negative.     As per HPI. Otherwise, a complete review of systems is negatve.  PAST MEDICAL HISTORY: Past Medical History  Diagnosis Date  . Dysrhythmia   . CHF (congestive heart failure)   . Hypertension   . Presence of permanent cardiac pacemaker   . GERD (gastroesophageal reflux disease)   . Arthritis   . Diabetes mellitus without complication   . Cancer     prostate  . CAD (coronary artery disease)   . Gout   . Prostate cancer   . Cardiomyopathy   . Chronic renal insufficiency   . History of permanent cardiac pacemaker placement   . COPD (chronic obstructive pulmonary disease)     PAST SURGICAL HISTORY: Past Surgical History  Procedure Laterality Date  . Cardiac catheterization    . Ileostomy    . Bladder removal    . Appendectomy    . Cataract extraction w/phaco Left 09/12/2014    Procedure: CATARACT EXTRACTION PHACO AND INTRAOCULAR LENS PLACEMENT ([facility]);  Surgeon: Estill Cotta, MD;  Location: [facility] ORS;  Service: Ophthalmology;   Laterality: Left;  [place]: 01:11.7   . Insert / replace / remove pacemaker    . Cataract extraction w/phaco Right 10/17/2014    Procedure: CATARACT EXTRACTION PHACO AND INTRAOCULAR LENS PLACEMENT ([facility]);  Surgeon: Estill Cotta, MD;  Location: [facility] ORS;  Service: Ophthalmology;  Laterality: Right;  [place] 01:47   . Total hip arthroplasty      FAMILY HISTORY:  Reviewed and unchanged. No reported history of malignancy or chronic disease.     ADVANCED DIRECTIVES:    HEALTH MAINTENANCE: History  Substance Use Topics  . Smoking status: Former Research scientist (life sciences)  . Smokeless tobacco: Not on file  . Alcohol Use: No     Colonoscopy:  PAP:  Bone density:  Lipid panel:  Allergies  Allergen Reactions  . Azithromycin Other (See Comments)    " cold sweats and just plain sick!'  . Codeine   . Codeine Sulfate   . Oxycodone-Acetaminophen Other (See Comments)    Current Outpatient Prescriptions  Medication Sig Dispense Refill  . aspirin 81 MG tablet Take 81 mg by mouth daily.    . hydrALAZINE (APRESOLINE) 50 MG tablet Take 50 mg by mouth 2 (two) times daily.  5  . isosorbide mononitrate (IMDUR) 30 MG 24 hr tablet Take 30 mg by mouth daily.    . metoprolol tartrate (LOPRESSOR) 25 MG tablet Take 25 mg by mouth 2 (two) times daily.  5  . ONGLYZA 5 MG TABS tablet     . pantoprazole (PROTONIX) 40 MG tablet  Take 40 mg by mouth 2 (two) times daily.    . sucralfate (CARAFATE) 1 G tablet Take 1 g by mouth daily.    . traMADol (ULTRAM) 50 MG tablet Take 50 mg by mouth at bedtime.    . traZODone (DESYREL) 100 MG tablet Take 100 mg by mouth at bedtime.     No current facility-administered medications for this visit.   Facility-Administered Medications Ordered in Other Visits  Medication Dose Route Frequency Provider Last Rate Last Dose  . sodium chloride 0.9 % injection 10 mL  10 mL Intravenous PRN Lloyd Huger, MD   10 mL at 10/26/14 1519    OBJECTIVE: Filed Vitals:   10/26/14 1630  BP: 167/77    Pulse: 78  Temp: 98.4 F (36.9 C)  Resp: 18     Body mass index is 23.87 kg/(m^2).    ECOG FS:0 - Asymptomatic  General: Well-developed, well-nourished, no acute distress. Eyes: Pink conjunctiva, anicteric sclera. Lungs: Clear to auscultation bilaterally. Heart: Regular rate and rhythm. No rubs, murmurs, or gallops. Abdomen: Soft, nontender, nondistended. No organomegaly noted, normoactive bowel sounds. Urostomy bag noted draining clear yellow urine. Musculoskeletal: No edema, cyanosis, or clubbing. Neuro: Alert, answering all questions appropriately. Cranial nerves grossly intact. Skin: No rashes or petechiae noted. Psych: Normal affect.   LAB RESULTS:  Lab Results  Component Value Date   NA 136 10/26/2014   K 4.4 10/26/2014   CL 106 10/26/2014   CO2 24 10/26/2014   GLUCOSE 136* 10/26/2014   BUN 29* 10/26/2014   CREATININE 1.66* 10/26/2014   CALCIUM 9.1 10/26/2014   PROT 7.0 10/26/2014   ALBUMIN 4.2 10/26/2014   AST 20 10/26/2014   ALT 14* 10/26/2014   ALKPHOS 58 10/26/2014   BILITOT 0.7 10/26/2014   GFRNONAA 37* 10/26/2014   GFRAA 43* 10/26/2014    Lab Results  Component Value Date   WBC 7.4 10/26/2014   NEUTROABS 5.6 10/26/2014   HGB 12.9* 10/26/2014   HCT 39.8* 10/26/2014   MCV 95.7 10/26/2014   PLT 133* 10/26/2014     STUDIES: No results found.  ASSESSMENT: Recurrent bladder cancer.  PLAN:    1. Bladder cancer: PET results from June 21, 2014 reviewed independently and reported no obvious evidence of disease. Continue follow-up with urology as scheduled. Return to clinic in 3 months with repeat laboratory work, repeat imaging, and further evaluation. Will image every 6 months for the first 2 years. Patient expressed understanding and was in agreement with this plan.  2. Renal insufficiency: Patient's creatinine is approximately at his baseline, monitor. 3. Pancytopenia: Nearly resolved. Monitor. 4. PET positive colon lesion:  Patient does not wish to  undergo colonoscopy at this time.  Patient expressed understanding and was in agreement with this plan. He also understands that He can call clinic at any time with any questions, concerns, or complaints.   Bladder cancer   Staging form: Urinary Bladder, AJCC 7th Edition     Clinical stage from 11/06/2014: Stage IV (T4, N0, M1) - Signed by Lloyd Huger, MD on 11/06/2014   Lloyd Huger, MD   11/06/2014 11:05 AM

## 2014-11-09 ENCOUNTER — Encounter: Payer: Self-pay | Admitting: Ophthalmology

## 2014-11-09 NOTE — Addendum Note (Signed)
Addendum  created 11/09/14 1423 by Elyse Hsu, MD   Modules edited: Anesthesia Attestations, Anesthesia Events, Narrator   Anesthesia Attestations:  Attestation Deleted   Narrator:  Narrator: Event Log Edited

## 2014-12-02 ENCOUNTER — Other Ambulatory Visit: Payer: Self-pay | Admitting: Vascular Surgery

## 2014-12-05 ENCOUNTER — Ambulatory Visit
Admission: RE | Admit: 2014-12-05 | Discharge: 2014-12-05 | Disposition: A | Discharge status: Home / Self Care | Payer: Medicare HMO | Source: Ambulatory Visit | Attending: Vascular Surgery | Admitting: Vascular Surgery

## 2014-12-05 ENCOUNTER — Encounter
Admission: RE | Disposition: A | Discharge status: Home / Self Care | Payer: Self-pay | Source: Ambulatory Visit | Attending: Vascular Surgery

## 2014-12-05 ENCOUNTER — Encounter: Payer: Self-pay | Admitting: *Deleted

## 2014-12-05 DIAGNOSIS — I340 Nonrheumatic mitral (valve) insufficiency: Secondary | ICD-10-CM | POA: Insufficient documentation

## 2014-12-05 DIAGNOSIS — R0602 Shortness of breath: Secondary | ICD-10-CM | POA: Insufficient documentation

## 2014-12-05 DIAGNOSIS — I509 Heart failure, unspecified: Secondary | ICD-10-CM | POA: Diagnosis not present

## 2014-12-05 DIAGNOSIS — I429 Cardiomyopathy, unspecified: Secondary | ICD-10-CM | POA: Diagnosis not present

## 2014-12-05 DIAGNOSIS — M109 Gout, unspecified: Secondary | ICD-10-CM | POA: Insufficient documentation

## 2014-12-05 DIAGNOSIS — I10 Essential (primary) hypertension: Secondary | ICD-10-CM | POA: Insufficient documentation

## 2014-12-05 DIAGNOSIS — E785 Hyperlipidemia, unspecified: Secondary | ICD-10-CM | POA: Insufficient documentation

## 2014-12-05 DIAGNOSIS — Z79899 Other long term (current) drug therapy: Secondary | ICD-10-CM | POA: Diagnosis not present

## 2014-12-05 DIAGNOSIS — Z7982 Long term (current) use of aspirin: Secondary | ICD-10-CM | POA: Diagnosis not present

## 2014-12-05 DIAGNOSIS — I2699 Other pulmonary embolism without acute cor pulmonale: Secondary | ICD-10-CM | POA: Diagnosis not present

## 2014-12-05 DIAGNOSIS — I2510 Atherosclerotic heart disease of native coronary artery without angina pectoris: Secondary | ICD-10-CM | POA: Diagnosis not present

## 2014-12-05 DIAGNOSIS — E119 Type 2 diabetes mellitus without complications: Secondary | ICD-10-CM | POA: Diagnosis not present

## 2014-12-05 DIAGNOSIS — I82409 Acute embolism and thrombosis of unspecified deep veins of unspecified lower extremity: Secondary | ICD-10-CM | POA: Insufficient documentation

## 2014-12-05 HISTORY — DX: Presence of automatic (implantable) cardiac defibrillator: Z95.810

## 2014-12-05 HISTORY — PX: PERIPHERAL VASCULAR CATHETERIZATION: SHX172C

## 2014-12-05 HISTORY — DX: Acute myocardial infarction, unspecified: I21.9

## 2014-12-05 HISTORY — DX: Malignant neoplasm of bladder, unspecified: C67.9

## 2014-12-05 SURGERY — IVC FILTER INSERTION
Anesthesia: Moderate Sedation

## 2014-12-05 MED ORDER — HEPARIN (PORCINE) IN NACL 2-0.9 UNIT/ML-% IJ SOLN
INTRAMUSCULAR | Status: AC
  Filled 2014-12-05: qty 500

## 2014-12-05 MED ORDER — SODIUM CHLORIDE 0.9 % IV SOLN: 500.0000 mL | Freq: Once | INTRAVENOUS | Status: DC | PRN

## 2014-12-05 MED ORDER — LIDOCAINE-EPINEPHRINE (PF) 1 %-1:200000 IJ SOLN
INTRAMUSCULAR | Status: AC
  Filled 2014-12-05: qty 30

## 2014-12-05 MED ORDER — LIDOCAINE HCL (PF) 1 % IJ SOLN
INTRAMUSCULAR | Status: DC | PRN
  Administered 2014-12-05: 10 mL

## 2014-12-05 MED ORDER — ACETAMINOPHEN 325 MG PO TABS: 325.0000 mg | ORAL_TABLET | ORAL | Status: DC | PRN

## 2014-12-05 MED ORDER — MIDAZOLAM HCL 2 MG/2ML IJ SOLN
INTRAMUSCULAR | Status: DC | PRN
  Administered 2014-12-05: 2 mg via INTRAVENOUS

## 2014-12-05 MED ORDER — PHENOL 1.4 % MT LIQD: 1.0000 | OROMUCOSAL | Status: DC | PRN

## 2014-12-05 MED ORDER — CEFAZOLIN SODIUM 1-5 GM-% IV SOLN
INTRAVENOUS | Status: AC
  Filled 2014-12-05: qty 50

## 2014-12-05 MED ORDER — LABETALOL HCL 5 MG/ML IV SOLN: 10.0000 mg | INTRAVENOUS | Status: DC | PRN

## 2014-12-05 MED ORDER — SODIUM CHLORIDE 0.9 % IV SOLN: INTRAVENOUS | Status: DC

## 2014-12-05 MED ORDER — MORPHINE SULFATE (PF) 4 MG/ML IV SOLN: 2.0000 mg | INTRAVENOUS | Status: DC | PRN

## 2014-12-05 MED ORDER — ALUM & MAG HYDROXIDE-SIMETH 200-200-20 MG/5ML PO SUSP: 15.0000 mL | ORAL | Status: DC | PRN

## 2014-12-05 MED ORDER — METOPROLOL TARTRATE 1 MG/ML IV SOLN: 2.0000 mg | INTRAVENOUS | Status: DC | PRN

## 2014-12-05 MED ORDER — MIDAZOLAM HCL 2 MG/2ML IJ SOLN
INTRAMUSCULAR | Status: AC
  Filled 2014-12-05: qty 2

## 2014-12-05 MED ORDER — ONDANSETRON HCL 4 MG/2ML IJ SOLN: 4.0000 mg | Freq: Four times a day (QID) | INTRAMUSCULAR | Status: DC | PRN

## 2014-12-05 MED ORDER — FENTANYL CITRATE (PF) 100 MCG/2ML IJ SOLN
INTRAMUSCULAR | Status: DC | PRN
  Administered 2014-12-05: 50 ug via INTRAVENOUS

## 2014-12-05 MED ORDER — ACETAMINOPHEN 325 MG RE SUPP: 325.0000 mg | RECTAL | Status: DC | PRN

## 2014-12-05 MED ORDER — FENTANYL CITRATE (PF) 100 MCG/2ML IJ SOLN
INTRAMUSCULAR | Status: AC
  Filled 2014-12-05: qty 2

## 2014-12-05 MED ORDER — HYDRALAZINE HCL 20 MG/ML IJ SOLN: 5.0000 mg | INTRAMUSCULAR | Status: DC | PRN

## 2014-12-05 MED ORDER — IOHEXOL 300 MG/ML  SOLN
INTRAMUSCULAR | Status: DC | PRN
  Administered 2014-12-05: 15 mL via INTRAVENOUS

## 2014-12-05 SURGICAL SUPPLY — 3 items
FILTER VC CELECT-FEMORAL (Filter) ×3 IMPLANT
PACK ANGIOGRAPHY (CUSTOM PROCEDURE TRAY) ×3 IMPLANT
WIRE J 3MM .035X145CM (WIRE) ×3 IMPLANT

## 2014-12-05 NOTE — Op Note (Signed)
[facility]   OPERATIVE NOTE    PRE-OPERATIVE DIAGNOSIS: DVT/PE and bleeding on anticoagulation  POST-OPERATIVE DIAGNOSIS: same as above  PROCEDURE: 1.   Ultrasound guidance for vascular access to the right femoral vein vein 2.   Catheter placement into the inferior vena cava 3.   Inferior venacavogram 4.   Placement of a Cook Celect IVC filter  SURGEON: Leotis Pain, MD  ASSISTANT(S): None  ANESTHESIA: local/sedation  ESTIMATED BLOOD LOSS: minimal  FINDING(S): 1.  Patent IVC  SPECIMEN(S):  none  INDICATIONS:   Bryce Clark is a 81 y.o. male who presents with a DVT and PE and had bleeding on Eliquis that required its cessation.  Inferior vena cava filter is indicated for this reason.  Risks and benefits including filter thrombosis, migration, fracture, bleeding, and infection were all discussed.  We discussed that all IVC filters that we place can be removed if desired from the patient once the need for the filter has passed.    DESCRIPTION: After obtaining full informed written consent, the patient was brought back to the vascular suite. The skin was sterilely prepped and draped in a sterile surgical field was created. The right femoral vein was accessed under direct ultrasound guidance without difficulty with a Seldinger needle and a J-wire was then placed. After skin nick and dilatation, the delivery sheath was placed into the inferior vena cava and an inferior venacavogram was performed. This demonstrated a patent IVC with the level of the renal veins at the L1-L2 interspace.  The filter was then deployed into the inferior vena cava at the level of L2 just below the renal veins. The delivery sheath was then removed. Pressure was held. Sterile dressings were placed. The patient tolerated the procedure well and was taken to the recovery room in stable condition.  COMPLICATIONS: None  CONDITION: Stable  Bryce Clark  12/05/2014, 4:20 PM

## 2014-12-05 NOTE — H&P (Signed)
See scanned H&P

## 2014-12-05 NOTE — Discharge Instructions (Signed)
Inferior Vena Cava Filter Insertion, Care After °Refer to this sheet in the next few weeks. These instructions provide you with information on caring for yourself after your procedure. Your health care provider may also give you more specific instructions. Your treatment has been planned according to current medical practices, but problems sometimes occur. Call your health care provider if you have any problems or questions after your procedure. °WHAT TO EXPECT AFTER THE PROCEDURE °After your procedure, it is typical to have the following: °· Mild pain in the area where the filter was inserted. °· Mild bruising in the area where the filter was inserted. °HOME CARE INSTRUCTIONS °· You will be given medicine to control pain. Only take over-the-counter or prescription medicines for pain, fever, or discomfort as directed by your health care provider. °· A bandage (dressing) has been placed over the insertion site. Follow your health care provider's instructions on how to care for it. °· Keep the insertion site clean and dry. °· Do not soak in a bath tub or pool until the filter insertion site has healed. °· Do not drive if you are taking narcotic pain medicines. Follow your health care provider's instructions about driving.  °· Do not return to work or school until your health care provider says it is okay.   °· Keep all follow-up appointments.   °SEEK IMMEDIATE MEDICAL CARE IF: °· You develop swelling and discoloration or pain in the legs. °· Your legs become pale and cold or blue. °· You develop shortness of breath, feel faint, or pass out. °· You develop chest pain, a cough, or difficulty breathing. °· You cough up blood. °· You develop a rash or feel you are having problems that may be a side effect of medicines. °· You develop weakness, difficulty moving your arms or legs, or balance problems. °· You develop problems with speech or vision. °Document Released: 01/13/2013 Document Reviewed: 01/13/2013 °[employer]®  Patient Information ©2015 [employer]. This information is not intended to replace advice given to you by your health care provider. Make sure you discuss any questions you have with your health care provider. ° °

## 2014-12-05 NOTE — H&P (Signed)
  [facility] History & Physical Update  The patient was interviewed and re-examined.  The patient's previous History and Physical from Dr. Laurelyn Sickle office has been reviewed and is unchanged. He has a DVT and had to stop Eliquis secondary to bleeding.  For this reason, he needs an IVC filter.  There is no change in the plan of care. We plan to proceed with the scheduled procedure.  DEW,JASON, MD  12/05/2014, 4:03 PM

## 2014-12-06 ENCOUNTER — Encounter: Payer: Self-pay | Admitting: Vascular Surgery

## 2014-12-07 ENCOUNTER — Encounter: Payer: Self-pay | Admitting: Ophthalmology

## 2014-12-08 ENCOUNTER — Encounter: Payer: Self-pay | Admitting: Vascular Surgery

## 2015-01-09 ENCOUNTER — Emergency Department: Payer: Medicare HMO

## 2015-01-09 ENCOUNTER — Inpatient Hospital Stay
Admission: EM | Admit: 2015-01-09 | Discharge: 2015-01-11 | DRG: 690 | Disposition: A | Discharge status: Home / Self Care | Payer: Medicare HMO | Attending: Internal Medicine | Admitting: Internal Medicine

## 2015-01-09 ENCOUNTER — Encounter: Payer: Self-pay | Admitting: Emergency Medicine

## 2015-01-09 DIAGNOSIS — M1990 Unspecified osteoarthritis, unspecified site: Secondary | ICD-10-CM | POA: Diagnosis present

## 2015-01-09 DIAGNOSIS — I130 Hypertensive heart and chronic kidney disease with heart failure and stage 1 through stage 4 chronic kidney disease, or unspecified chronic kidney disease: Secondary | ICD-10-CM | POA: Diagnosis present

## 2015-01-09 DIAGNOSIS — E1122 Type 2 diabetes mellitus with diabetic chronic kidney disease: Secondary | ICD-10-CM | POA: Diagnosis present

## 2015-01-09 DIAGNOSIS — Z9889 Other specified postprocedural states: Secondary | ICD-10-CM | POA: Diagnosis not present

## 2015-01-09 DIAGNOSIS — I2510 Atherosclerotic heart disease of native coronary artery without angina pectoris: Secondary | ICD-10-CM | POA: Diagnosis present

## 2015-01-09 DIAGNOSIS — Z96649 Presence of unspecified artificial hip joint: Secondary | ICD-10-CM | POA: Diagnosis present

## 2015-01-09 DIAGNOSIS — Z87891 Personal history of nicotine dependence: Secondary | ICD-10-CM | POA: Diagnosis not present

## 2015-01-09 DIAGNOSIS — R748 Abnormal levels of other serum enzymes: Secondary | ICD-10-CM | POA: Diagnosis present

## 2015-01-09 DIAGNOSIS — I214 Non-ST elevation (NSTEMI) myocardial infarction: Secondary | ICD-10-CM

## 2015-01-09 DIAGNOSIS — N390 Urinary tract infection, site not specified: Secondary | ICD-10-CM | POA: Diagnosis present

## 2015-01-09 DIAGNOSIS — Z885 Allergy status to narcotic agent status: Secondary | ICD-10-CM | POA: Diagnosis not present

## 2015-01-09 DIAGNOSIS — J449 Chronic obstructive pulmonary disease, unspecified: Secondary | ICD-10-CM | POA: Diagnosis present

## 2015-01-09 DIAGNOSIS — Z8546 Personal history of malignant neoplasm of prostate: Secondary | ICD-10-CM | POA: Diagnosis not present

## 2015-01-09 DIAGNOSIS — Z823 Family history of stroke: Secondary | ICD-10-CM | POA: Diagnosis not present

## 2015-01-09 DIAGNOSIS — Z936 Other artificial openings of urinary tract status: Secondary | ICD-10-CM | POA: Diagnosis not present

## 2015-01-09 DIAGNOSIS — R0602 Shortness of breath: Secondary | ICD-10-CM | POA: Diagnosis present

## 2015-01-09 DIAGNOSIS — I252 Old myocardial infarction: Secondary | ICD-10-CM | POA: Diagnosis not present

## 2015-01-09 DIAGNOSIS — K219 Gastro-esophageal reflux disease without esophagitis: Secondary | ICD-10-CM | POA: Diagnosis present

## 2015-01-09 DIAGNOSIS — Z95810 Presence of automatic (implantable) cardiac defibrillator: Secondary | ICD-10-CM | POA: Diagnosis not present

## 2015-01-09 DIAGNOSIS — R778 Other specified abnormalities of plasma proteins: Secondary | ICD-10-CM | POA: Diagnosis present

## 2015-01-09 DIAGNOSIS — Z86711 Personal history of pulmonary embolism: Secondary | ICD-10-CM

## 2015-01-09 DIAGNOSIS — N183 Chronic kidney disease, stage 3 unspecified: Secondary | ICD-10-CM | POA: Diagnosis present

## 2015-01-09 DIAGNOSIS — I5042 Chronic combined systolic (congestive) and diastolic (congestive) heart failure: Secondary | ICD-10-CM | POA: Diagnosis present

## 2015-01-09 DIAGNOSIS — R7989 Other specified abnormal findings of blood chemistry: Secondary | ICD-10-CM | POA: Diagnosis present

## 2015-01-09 DIAGNOSIS — Z7982 Long term (current) use of aspirin: Secondary | ICD-10-CM

## 2015-01-09 DIAGNOSIS — M109 Gout, unspecified: Secondary | ICD-10-CM | POA: Diagnosis present

## 2015-01-09 DIAGNOSIS — Z9841 Cataract extraction status, right eye: Secondary | ICD-10-CM | POA: Diagnosis not present

## 2015-01-09 DIAGNOSIS — Z79899 Other long term (current) drug therapy: Secondary | ICD-10-CM | POA: Diagnosis not present

## 2015-01-09 DIAGNOSIS — E119 Type 2 diabetes mellitus without complications: Secondary | ICD-10-CM

## 2015-01-09 DIAGNOSIS — I10 Essential (primary) hypertension: Secondary | ICD-10-CM | POA: Diagnosis present

## 2015-01-09 DIAGNOSIS — R0600 Dyspnea, unspecified: Secondary | ICD-10-CM

## 2015-01-09 DIAGNOSIS — Z888 Allergy status to other drugs, medicaments and biological substances status: Secondary | ICD-10-CM | POA: Diagnosis not present

## 2015-01-09 DIAGNOSIS — Z961 Presence of intraocular lens: Secondary | ICD-10-CM | POA: Diagnosis present

## 2015-01-09 DIAGNOSIS — Z9049 Acquired absence of other specified parts of digestive tract: Secondary | ICD-10-CM | POA: Diagnosis not present

## 2015-01-09 DIAGNOSIS — Z794 Long term (current) use of insulin: Secondary | ICD-10-CM | POA: Diagnosis not present

## 2015-01-09 DIAGNOSIS — Z8551 Personal history of malignant neoplasm of bladder: Secondary | ICD-10-CM

## 2015-01-09 HISTORY — DX: Chronic combined systolic (congestive) and diastolic (congestive) heart failure: I50.42

## 2015-01-09 LAB — COMPREHENSIVE METABOLIC PANEL
ALBUMIN: 3.8 g/dL (ref 3.5–5.0)
ALK PHOS: 74 U/L (ref 38–126)
ALT: 15 U/L — ABNORMAL LOW (ref 17–63)
ANION GAP: 9 (ref 5–15)
AST: 21 U/L (ref 15–41)
BILIRUBIN TOTAL: 0.9 mg/dL (ref 0.3–1.2)
BUN: 25 mg/dL — ABNORMAL HIGH (ref 6–20)
CALCIUM: 9.2 mg/dL (ref 8.9–10.3)
CO2: 22 mmol/L (ref 22–32)
Chloride: 99 mmol/L — ABNORMAL LOW (ref 101–111)
Creatinine, Ser: 1.81 mg/dL — ABNORMAL HIGH (ref 0.61–1.24)
GFR calc non Af Amer: 34 mL/min — ABNORMAL LOW (ref >60)
GFR, EST AFRICAN AMERICAN: 39 mL/min — AB (ref >60)
GLUCOSE: 108 mg/dL — AB (ref 65–99)
Potassium: 3.9 mmol/L (ref 3.5–5.1)
Sodium: 130 mmol/L — ABNORMAL LOW (ref 135–145)
TOTAL PROTEIN: 6.7 g/dL (ref 6.5–8.1)

## 2015-01-09 LAB — URINALYSIS COMPLETE WITH MICROSCOPIC (ARMC ONLY)
BILIRUBIN URINE: NEGATIVE
Glucose, UA: NEGATIVE mg/dL
Ketones, ur: NEGATIVE mg/dL
Nitrite: NEGATIVE
PH: 7 (ref 5.0–8.0)
PROTEIN: 30 mg/dL — AB
Specific Gravity, Urine: 1.012 (ref 1.005–1.030)

## 2015-01-09 LAB — CBC WITH DIFFERENTIAL/PLATELET
Basophils Absolute: 0.1 10*3/uL (ref 0–0.1)
Basophils Relative: 1 %
Eosinophils Absolute: 0.3 10*3/uL (ref 0–0.7)
Eosinophils Relative: 5 %
HEMATOCRIT: 38.1 % — AB (ref 40.0–52.0)
HEMOGLOBIN: 13 g/dL (ref 13.0–18.0)
LYMPHS ABS: 0.9 10*3/uL — AB (ref 1.0–3.6)
LYMPHS PCT: 11 %
MCH: 32.3 pg (ref 26.0–34.0)
MCHC: 34.3 g/dL (ref 32.0–36.0)
MCV: 94.3 fL (ref 80.0–100.0)
MONOS PCT: 9 %
Monocytes Absolute: 0.7 10*3/uL (ref 0.2–1.0)
NEUTROS ABS: 5.7 10*3/uL (ref 1.4–6.5)
NEUTROS PCT: 74 %
Platelets: 133 10*3/uL — ABNORMAL LOW (ref 150–440)
RBC: 4.04 MIL/uL — ABNORMAL LOW (ref 4.40–5.90)
RDW: 14 % (ref 11.5–14.5)
WBC: 7.7 10*3/uL (ref 3.8–10.6)

## 2015-01-09 LAB — APTT: APTT: 30 s (ref 24–36)

## 2015-01-09 LAB — TROPONIN I: Troponin I: 0.14 ng/mL — ABNORMAL HIGH (ref <0.031)

## 2015-01-09 LAB — BRAIN NATRIURETIC PEPTIDE: B Natriuretic Peptide: 69 pg/mL (ref 0.0–100.0)

## 2015-01-09 LAB — PROTIME-INR
INR: 1.11
PROTHROMBIN TIME: 14.5 s (ref 11.4–15.0)

## 2015-01-09 MED ORDER — ONDANSETRON HCL 4 MG/2ML IJ SOLN: 4.0000 mg | Freq: Four times a day (QID) | INTRAMUSCULAR | Status: DC | PRN

## 2015-01-09 MED ORDER — ONDANSETRON HCL 4 MG PO TABS: 4.0000 mg | ORAL_TABLET | Freq: Four times a day (QID) | ORAL | Status: DC | PRN

## 2015-01-09 MED ORDER — SODIUM CHLORIDE 0.9 % IJ SOLN
3.0000 mL | Freq: Two times a day (BID) | INTRAMUSCULAR | Status: DC
  Administered 2015-01-10 – 2015-01-11 (×4): 3 mL via INTRAVENOUS

## 2015-01-09 MED ORDER — ASPIRIN EC 81 MG PO TBEC
81.0000 mg | DELAYED_RELEASE_TABLET | Freq: Every day | ORAL | Status: DC
  Administered 2015-01-10 – 2015-01-11 (×2): 81 mg via ORAL
  Filled 2015-01-09 (×2): qty 1

## 2015-01-09 MED ORDER — ASPIRIN 81 MG PO CHEW
324.0000 mg | CHEWABLE_TABLET | Freq: Once | ORAL | Status: AC
  Administered 2015-01-09: 324 mg via ORAL
  Filled 2015-01-09: qty 4

## 2015-01-09 MED ORDER — INSULIN ASPART 100 UNIT/ML ~~LOC~~ SOLN: 0.0000 [IU] | Freq: Four times a day (QID) | SUBCUTANEOUS | Status: DC

## 2015-01-09 MED ORDER — HYDRALAZINE HCL 50 MG PO TABS
50.0000 mg | ORAL_TABLET | Freq: Two times a day (BID) | ORAL | Status: DC
  Administered 2015-01-10 – 2015-01-11 (×4): 50 mg via ORAL
  Filled 2015-01-09 (×4): qty 1

## 2015-01-09 MED ORDER — ISOSORBIDE MONONITRATE ER 30 MG PO TB24
30.0000 mg | ORAL_TABLET | Freq: Every day | ORAL | Status: DC
  Administered 2015-01-10 – 2015-01-11 (×2): 30 mg via ORAL
  Filled 2015-01-09 (×2): qty 1

## 2015-01-09 MED ORDER — TRAZODONE HCL 100 MG PO TABS
100.0000 mg | ORAL_TABLET | Freq: Every day | ORAL | Status: DC
  Administered 2015-01-10 (×2): 100 mg via ORAL
  Filled 2015-01-09 (×2): qty 1

## 2015-01-09 MED ORDER — METOPROLOL TARTRATE 50 MG PO TABS
50.0000 mg | ORAL_TABLET | Freq: Two times a day (BID) | ORAL | Status: DC
Start: 2015-01-10 — End: 2015-01-11
  Administered 2015-01-10 – 2015-01-11 (×4): 50 mg via ORAL
  Filled 2015-01-09 (×4): qty 1

## 2015-01-09 MED ORDER — HEPARIN SODIUM (PORCINE) 5000 UNIT/ML IJ SOLN
5000.0000 [IU] | Freq: Three times a day (TID) | INTRAMUSCULAR | Status: DC
  Administered 2015-01-10 – 2015-01-11 (×5): 5000 [IU] via SUBCUTANEOUS
  Filled 2015-01-09 (×5): qty 1

## 2015-01-09 MED ORDER — ALBUTEROL SULFATE (2.5 MG/3ML) 0.083% IN NEBU
2.5000 mg | INHALATION_SOLUTION | Freq: Once | RESPIRATORY_TRACT | Status: AC
  Administered 2015-01-09: 2.5 mg via RESPIRATORY_TRACT
  Filled 2015-01-09: qty 3

## 2015-01-09 NOTE — ED Notes (Signed)
Patient transported to X-ray 

## 2015-01-09 NOTE — ED Provider Notes (Signed)
[facility] Emergency Department Provider Note  ____________________________________________  Time seen: Approximately 7:19 PM  I have reviewed the triage vital signs and the nursing notes.   HISTORY  Chief Complaint Shortness of Breath    HPI Bryce Clark is a 81 y.o. male with history of coronary artery disease, CHF status post pacemaker, history of PE s/p IVC filter, history of prostate cancer status post urostomy, GERD, hypertension presents with nearly one week of worsening shortness of breath, worse with exertion, gradual onset, constant. Patient has had cough productive of whitish phlegm as well as chest "congestion". He was seen by his primary care doctor last week and prescribed Augmentin however he reports his symptoms have continued. He denies any chest pain. No vomiting or diarrhea. He has had subjective fevers with shaking chills at home. Currently his symptoms are moderate to severe.   Past Medical History  Diagnosis Date  . Dysrhythmia   . CHF (congestive heart failure) ([place])   . Hypertension   . Presence of permanent cardiac pacemaker   . GERD (gastroesophageal reflux disease)   . Arthritis   . Diabetes mellitus without complication ([place])   . Cancer [facility])     prostate  . CAD (coronary artery disease)   . Gout   . Prostate cancer ([place])   . Cardiomyopathy ([place])   . Chronic renal insufficiency   . History of permanent cardiac pacemaker placement   . COPD (chronic obstructive pulmonary disease) ([place])   . AICD (automatic cardioverter/defibrillator) present   . Myocardial infarction ([place])   . Bladder cancer [facility])     Patient Active Problem List   Diagnosis Date Noted  . Bladder cancer ([place]) 11/06/2014    Past Surgical History  Procedure Laterality Date  . Cardiac catheterization    . Ileostomy    . Bladder removal    . Appendectomy    . Insert / replace / remove pacemaker    . Total hip arthroplasty    . Cataract extraction  w/phaco Right 10/17/2014    Procedure: CATARACT EXTRACTION PHACO AND INTRAOCULAR LENS PLACEMENT (IOC);  Surgeon: Estill Cotta, MD;  Location: [facility] ORS;  Service: Ophthalmology;  Laterality: Right;  [place] 01:47 AP%61.8 CDE49.41 fluid pack lot # [id number]  . Revision urostomy cutaneous    . Cataract extraction w/phaco Left 09/12/2014    Procedure: CATARACT EXTRACTION PHACO AND INTRAOCULAR LENS PLACEMENT (IOC);  Surgeon: Estill Cotta, MD;  Location: [facility] ORS;  Service: Ophthalmology;  Laterality: Left;  [place]: 01:11.7 AP: 25.6 CDE: 29.48   . Peripheral vascular catheterization N/A 12/05/2014    Procedure: IVC Filter Insertion;  Surgeon: Algernon Huxley, MD;  Location: [facility];  Service: Cardiovascular;  Laterality: N/A;    Current Outpatient Rx  Name  Route  Sig  Dispense  Refill  . amoxicillin-clavulanate (AUGMENTIN) 500-125 MG per tablet   Oral   Take 1 tablet by mouth 3 (three) times daily.         Marland Kitchen aspirin 81 MG tablet   Oral   Take 81 mg by mouth daily.         . cetirizine (ZYRTEC) 10 MG tablet   Oral   Take 10 mg by mouth daily.         . colchicine 0.6 MG tablet   Oral   Take 0.6 mg by mouth daily.         . hydrALAZINE (APRESOLINE) 50 MG tablet   Oral   Take 50 mg  by mouth 2 (two) times daily.      5   . isosorbide mononitrate (IMDUR) 30 MG 24 hr tablet   Oral   Take 30 mg by mouth daily.         . metoprolol succinate (TOPROL-XL) 50 MG 24 hr tablet   Oral   Take 50 mg by mouth 2 (two) times daily. Take with or immediately following a meal.         . metoprolol tartrate (LOPRESSOR) 25 MG tablet   Oral   Take 25 mg by mouth 2 (two) times daily.      5   . ONGLYZA 5 MG TABS tablet                 Dispense as written.   . pantoprazole (PROTONIX) 40 MG tablet   Oral   Take 40 mg by mouth 2 (two) times daily.         . sucralfate (CARAFATE) 1 G tablet   Oral   Take 1 g by mouth daily.         . traMADol (ULTRAM) 50 MG  tablet   Oral   Take 50 mg by mouth at bedtime.         . traZODone (DESYREL) 100 MG tablet   Oral   Take 100 mg by mouth at bedtime.           Allergies Azithromycin; Codeine; Codeine sulfate; Oxycodone-acetaminophen; and Xarelto  No family history on file.  Social History Social History  Substance Use Topics  . Smoking status: Former Smoker -- 0.25 packs/day for 1 years  . Smokeless tobacco: None  . Alcohol Use: No    Review of Systems Constitutional: + subjective fever/chills Eyes: No visual changes. ENT: No sore throat. Cardiovascular: Denies chest pain. Respiratory: + shortness of breath. Gastrointestinal: No abdominal pain.  No nausea, no vomiting.  No diarrhea.  No constipation. Genitourinary: Negative for dysuria. Musculoskeletal: Negative for back pain. Skin: Negative for rash. Neurological: Negative for headaches, focal weakness or numbness.  10-point ROS otherwise negative.  ____________________________________________   PHYSICAL EXAM:  VITAL SIGNS: ED Triage Vitals  Enc Vitals Group     BP 01/09/15 1858 132/68 mmHg     Pulse Rate 01/09/15 1858 79     Resp 01/09/15 1858 20     Temp 01/09/15 1858 99.3 F (37.4 C)     Temp Source 01/09/15 1858 Oral     SpO2 01/09/15 1858 97 %     Weight 01/09/15 1858 165 lb (74.844 kg)     Height 01/09/15 1858 5\' 10"  (1.778 m)     Head Cir --      Peak Flow --      Pain Score --      Pain Loc --      Pain Edu? --      Excl. in [place]? --     Constitutional: Alert and oriented. Well appearing and in no acute distress. Eyes: Conjunctivae are normal. PERRL. EOMI. Head: Atraumatic. Nose: No congestion/rhinnorhea. Mouth/Throat: Mucous membranes are moist.  Oropharynx non-erythematous. Neck: No stridor.   Cardiovascular: Normal rate, regular rhythm. Grossly normal heart sounds.  Good peripheral circulation. Respiratory: Mild tachypnea without increased WOB . Lungs CTAB. Gastrointestinal: Soft and nontender. No  distention. Urostomy in RLQ draining cloudy yellow urine. No CVA tenderness. Genitourinary: deferred Musculoskeletal: No lower extremity tenderness nor edema.  No joint effusions. Neurologic:  Normal speech and language. No gross focal neurologic deficits  are appreciated. No gait instability. Skin:  Skin is warm, dry and intact. No rash noted. Psychiatric: Mood and affect are normal. Speech and behavior are normal.  ____________________________________________   LABS (all labs ordered are listed, but only abnormal results are displayed)  Labs Reviewed  CBC WITH DIFFERENTIAL/PLATELET - Abnormal; Notable for the following:    RBC 4.04 (*)    HCT 38.1 (*)    Platelets 133 (*)    Lymphs Abs 0.9 (*)    All other components within normal limits  COMPREHENSIVE METABOLIC PANEL - Abnormal; Notable for the following:    Sodium 130 (*)    Chloride 99 (*)    Glucose, Bld 108 (*)    BUN 25 (*)    Creatinine, Ser 1.81 (*)    ALT 15 (*)    GFR calc non Af Amer 34 (*)    GFR calc Af Amer 39 (*)    All other components within normal limits  TROPONIN I - Abnormal; Notable for the following:    Troponin I 0.14 (*)    All other components within normal limits  URINALYSIS COMPLETEWITH MICROSCOPIC ([facility] ONLY) - Abnormal; Notable for the following:    Color, Urine YELLOW (*)    APPearance CLOUDY (*)    Hgb urine dipstick 1+ (*)    Protein, ur 30 (*)    Leukocytes, UA 3+ (*)    Bacteria, UA MANY (*)    Squamous Epithelial / LPF 0-5 (*)    All other components within normal limits  CULTURE, BLOOD (ROUTINE X 2)  CULTURE, BLOOD (ROUTINE X 2)  URINE CULTURE  BRAIN NATRIURETIC PEPTIDE  PROTIME-INR  APTT   ____________________________________________  EKG  ED ECG REPORT I, Joanne Gavel, the attending physician, personally viewed and interpreted this ECG.   Date: 01/09/2015  EKG Time: 19:14  Rate: 75  Rhythm: electronic ventricular pacemaker  Axis: unable to determine   Intervals:none  ST&T Change: unable to determine  ____________________________________________  RADIOLOGY  CXR IMPRESSION: No acute cardiopulmonary process.  ____________________________________________   PROCEDURES  Procedure(s) performed: None  Critical Care performed: No.   ____________________________________________   INITIAL IMPRESSION / ASSESSMENT AND PLAN / ED COURSE  Pertinent labs & imaging results that were available during my care of the patient were reviewed by me and considered in my medical decision making (see chart for details).  Boone Gear Petitjean is a 81 y.o. male with history of coronary artery disease, CHF status post pacemaker, history of prostate cancer status post urostomy, history of PE s/p IVC filter, GERD, hypertension presents with nearly one week of worsening shortness of breath. On exam, he does appear quite tachypneic/dyspneic with minor exertion. His lungs are clear to auscultation. He does not appear volume overloaded. Plan for screening labs, EKG, chest x-ray, reassess for need for advanced imaging and reassess for disposition. We'll give a small albuterol nebulizer treatment as he reports this has been helpful to him in the past.  ----------------------------------------- 9:20 PM on 01/09/2015 -----------------------------------------  Patient reports mild improvement of his chart as of breath after albuterol neb he was in treatment. Troponin is elevated at 0.14 and a suspect his symptoms are more likely related to NSTEMI as opposed of bronchospasm or infectious complaint. Case discussed with hospitalist, Dr. Jannifer Franklin, at this time for admission. ____________________________________________   FINAL CLINICAL IMPRESSION(S) / ED DIAGNOSES  Final diagnoses:  SOB (shortness of breath)  NSTEMI (non-ST elevated myocardial infarction) ([place])      Joanne Gavel,  MD 01/09/15 2123

## 2015-01-09 NOTE — H&P (Signed)
[facility] [place] NAME: [name]    MR#:  [id number]  DATE OF BIRTH:  [birth year]  DATE OF ADMISSION:  01/09/2015  PRIMARY CARE PHYSICIAN: Volanda Napoleon, MD   REQUESTING/REFERRING PHYSICIAN: Edd Fabian, MD  CHIEF COMPLAINT:   Chief Complaint  Patient presents with  . Shortness of Breath    x 1 week    HISTORY OF PRESENT ILLNESS:  Bryce Clark  is a 81 y.o. male who presents with increasing "chest congestion." Patient states that for the past week or so he has had an increase in what he calls "chest congestion." He states that this is due to his congestive heart failure. He describes the sensation as a feeling of fullness in his chest that works his way up to his throat and then causes him to cough up some amount of thin clear sputum. He also reports some intermittent orthopnea. He states that he has had some chronic stable angina with significant exertion (states when he walks a long way) which has been stable for some time now. He does not report any overt chest pain, radiation, diaphoresis, nausea. He does report several episodes of chills over the last 2-3 days. He has a history of diverting urostomy after bladder resection due to bladder cancer, and reports that he has also had some increased lower abdominal tenderness over the same time. In the ED he was found to have an elevated troponin is 0.14. His white count was normal, but his UA had too numerous to count red and white cells with 3+ leukocyte esterase. Hospitalists were called for admission to evaluate for possible ACS.  PAST MEDICAL HISTORY:   Past Medical History  Diagnosis Date  . Dysrhythmia   . Chronic combined systolic and diastolic CHF (congestive heart failure) ([place])   . Hypertension   . Presence of permanent cardiac pacemaker   . GERD (gastroesophageal reflux disease)   . Arthritis   . Diabetes mellitus without complication ([place])   . CAD (coronary  artery disease)   . Gout   . Prostate cancer ([place])   . Cardiomyopathy ([place])   . Chronic renal insufficiency   . History of permanent cardiac pacemaker placement   . COPD (chronic obstructive pulmonary disease) ([place])   . AICD (automatic cardioverter/defibrillator) present   . Myocardial infarction ([place])   . Bladder cancer ([place])     PAST SURGICAL HISTORY:   Past Surgical History  Procedure Laterality Date  . Cardiac catheterization    . Ileostomy    . Bladder removal    . Appendectomy    . Insert / replace / remove pacemaker    . Total hip arthroplasty    . Cataract extraction w/phaco Right 10/17/2014    Procedure: CATARACT EXTRACTION PHACO AND INTRAOCULAR LENS PLACEMENT (IOC);  Surgeon: Estill Cotta, MD;  Location: [facility] ORS;  Service: Ophthalmology;  Laterality: Right;  [place] 01:47 AP%61.8 CDE49.41 fluid pack lot # [id number]  . Revision urostomy cutaneous    . Cataract extraction w/phaco Left 09/12/2014    Procedure: CATARACT EXTRACTION PHACO AND INTRAOCULAR LENS PLACEMENT (IOC);  Surgeon: Estill Cotta, MD;  Location: [facility] ORS;  Service: Ophthalmology;  Laterality: Left;  [place]: 01:11.7 AP: 25.6 CDE: 29.48   . Peripheral vascular catheterization N/A 12/05/2014    Procedure: IVC Filter Insertion;  Surgeon: Algernon Huxley, MD;  Location: [place];  Service: Cardiovascular;  Laterality: N/A;    SOCIAL HISTORY:   Social History  Substance Use Topics  . Smoking status: Former Smoker -- 0.25 packs/day for 1 years  . Smokeless tobacco: Not on file  . Alcohol Use: No    FAMILY HISTORY:   Family History  Problem Relation Age of Onset  . Stroke Father     DRUG ALLERGIES:   Allergies  Allergen Reactions  . Azithromycin Other (See Comments)    Reaction:  Cold sweats   . Codeine Nausea And Vomiting  . Oxycodone-Acetaminophen Nausea And Vomiting  . Xarelto [Rivaroxaban] Other (See Comments)    Reaction:  Bleeding     MEDICATIONS AT HOME:   Prior to  Admission medications   Medication Sig Start Date End Date Taking? Authorizing Provider  aspirin EC 81 MG tablet Take 81 mg by mouth daily.   Yes Historical Provider, MD  cetirizine (ZYRTEC) 10 MG tablet Take 10 mg by mouth daily.   Yes Historical Provider, MD  colchicine 0.6 MG tablet Take 0.6 mg by mouth daily.   Yes Historical Provider, MD  hydrALAZINE (APRESOLINE) 50 MG tablet Take 50 mg by mouth 2 (two) times daily.   Yes Historical Provider, MD  insulin glargine (LANTUS) 100 UNIT/ML injection Inject 10 Units into the skin at bedtime as needed (for high blood sugar).   Yes Historical Provider, MD  isosorbide mononitrate (IMDUR) 30 MG 24 hr tablet Take 30 mg by mouth daily.   Yes Historical Provider, MD  metoprolol (LOPRESSOR) 50 MG tablet Take 50 mg by mouth 2 (two) times daily.   Yes Historical Provider, MD  saxagliptin HCl (ONGLYZA) 5 MG TABS tablet Take 5 mg by mouth daily.   Yes Historical Provider, MD  sodium chloride (OCEAN) 0.65 % SOLN nasal spray Place 1 spray into both nostrils as needed for congestion.   Yes Historical Provider, MD  sucralfate (CARAFATE) 1 G tablet Take 1 g by mouth 4 (four) times daily.    Yes Historical Provider, MD  traZODone (DESYREL) 100 MG tablet Take 100 mg by mouth at bedtime.   Yes Historical Provider, MD    REVIEW OF SYSTEMS:  Review of Systems  Constitutional: Positive for chills. Negative for fever, weight loss and malaise/fatigue.  HENT: Negative for ear pain, hearing loss and tinnitus.   Eyes: Negative for blurred vision, double vision, pain and redness.  Respiratory: Negative for cough, hemoptysis and shortness of breath.        "Chest congestion"  Cardiovascular: Positive for orthopnea. Negative for chest pain, palpitations and leg swelling.  Gastrointestinal: Positive for abdominal pain. Negative for nausea, vomiting, diarrhea and constipation.  Genitourinary: Negative for dysuria, frequency and hematuria.  Musculoskeletal: Negative for back  pain, joint pain and neck pain.  Skin:       No acne, rash, or lesions  Neurological: Negative for dizziness, tremors, focal weakness and weakness.  Endo/Heme/Allergies: Negative for polydipsia. Does not bruise/bleed easily.  Psychiatric/Behavioral: Negative for depression. The patient is not nervous/anxious and does not have insomnia.      VITAL SIGNS:   Filed Vitals:   01/09/15 1858 01/09/15 2100 01/09/15 2130 01/09/15 2200  BP: 132/68 130/67 128/66 151/77  Pulse: 79 76 74 77  Temp: 99.3 F (37.4 C)     TempSrc: Oral     Resp: 20 15 14 17   Height: 5\' 10"  (1.778 m)     Weight: 74.844 kg (165 lb)     SpO2: 97% 93%  98%   Wt Readings from Last 3 Encounters:  01/09/15 74.844 kg (165 lb)  12/05/14 78.472 kg (173 lb)  10/26/14 77.599 kg (171 lb 1.2 oz)    PHYSICAL EXAMINATION:  Physical Exam  Vitals reviewed. Constitutional: He is oriented to person, place, and time. He appears well-developed and well-nourished. No distress.  HENT:  Head: Normocephalic and atraumatic.  Mouth/Throat: Oropharynx is clear and moist.  Eyes: Conjunctivae and EOM are normal. Pupils are equal, round, and reactive to light. No scleral icterus.  Neck: Normal range of motion. Neck supple. No JVD present. No thyromegaly present.  Cardiovascular: Normal rate, regular rhythm and intact distal pulses.  Exam reveals no gallop and no friction rub.   No murmur heard. Respiratory: Effort normal and breath sounds normal. No respiratory distress. He has no wheezes. He has no rales.  GI: Soft. Bowel sounds are normal. He exhibits no distension. There is tenderness (lower abdomen).  Musculoskeletal: Normal range of motion. He exhibits no edema.  No arthritis, no gout  Lymphadenopathy:    He has no cervical adenopathy.  Neurological: He is alert and oriented to person, place, and time. No cranial nerve deficit.  No dysarthria, no aphasia  Skin: Skin is warm and dry. No rash noted. No erythema.  Psychiatric: He  has a normal mood and affect. His behavior is normal. Judgment and thought content normal.    LABORATORY PANEL:   CBC  Recent Labs Lab 01/09/15 1928  WBC 7.7  HGB 13.0  HCT 38.1*  PLT 133*   ------------------------------------------------------------------------------------------------------------------  Chemistries   Recent Labs Lab 01/09/15 1928  NA 130*  K 3.9  CL 99*  CO2 22  GLUCOSE 108*  BUN 25*  CREATININE 1.81*  CALCIUM 9.2  AST 21  ALT 15*  ALKPHOS 74  BILITOT 0.9   ------------------------------------------------------------------------------------------------------------------  Cardiac Enzymes  Recent Labs Lab 01/09/15 1928  TROPONINI 0.14*   ------------------------------------------------------------------------------------------------------------------  RADIOLOGY:  Dg Chest 2 View  01/09/2015   CLINICAL DATA:  Shortness of breath for 1 week with bloody stools and weakness for 2 days. Initial encounter.  EXAM: CHEST  2 VIEW  COMPARISON:  PET-CT 06/21/2014.  Chest radiograph 05/27/2009.  FINDINGS: Left subclavian AICD leads appear unchanged within the right atrium, right ventricle and coronary sinus. The heart size and mediastinal contours are stable. There is aortic atherosclerosis. The lungs are clear. There is no pleural effusion or pneumothorax. The bones appear unchanged.  IMPRESSION: No acute cardiopulmonary process.   Electronically Signed   By: Richardean Sale M.D.   On: 01/09/2015 20:28    EKG:   Orders placed or performed during the hospital encounter of 01/09/15  . EKG 12-Lead  . EKG 12-Lead    IMPRESSION AND PLAN:  Principal Problem:   UTI (urinary tract infection) - patient has a history of bladder cancer with bladder resection and diverting urostomy. Symptoms of chills at home and increased lower abdominal pain for the past 2-3 days with a UA suggestive of UTI. We'll start IV Rocephin and follow-up blood and urine cultures which  were sent in the ED Active Problems:   Elevated troponin - unclear if this is true ACS or simply troponin leak due to heart failure in the setting of infection. Patient's symptoms are possibly atypical anginal symptoms. We'll trend cardiac enzymes and get an echocardiogram and cardiology consult for the morning.   Chronic combined systolic and diastolic CHF (congestive heart failure) (HCC) - BNP was normal and lungs are currently clear, continue home meds for this and monitor   Type 2 diabetes mellitus (HCC) - sliding scale  insulin and appropriate fingerstick glucose checks every 6 hours for now while nothing by mouth for possible cardiac intervention in the morning depending on cardiac enzyme results overnight, then will need insulin and glucose checks before meals at bedtime with a heart healthy/carb modified diet   CAD (coronary artery disease) - continue home meds for this   HTN (hypertension) - currently reasonably controlled, continue home meds, can use IV antihypertensives when necessary   CKD (chronic kidney disease), stage III - patient seems to be close to his baseline, we'll continue to monitor carefully and avoid nephrotoxins  All the records are reviewed and case discussed with ED provider. Management plans discussed with the patient and/or family.  DVT PROPHYLAXIS: SubQ heparin  ADMISSION STATUS: Inpatient  CODE STATUS: Full  TOTAL TIME TAKING CARE OF THIS PATIENT: 45 minutes.    Ekaterini Capitano FIELDING 01/09/2015, 10:30 PM  [facility]  Office  [phone]  CC: Primary care physician; Volanda Napoleon, MD

## 2015-01-09 NOTE — ED Notes (Signed)
States has been SOB x 1 week, increasing, saw MD 3 days ago with no result.

## 2015-01-10 LAB — CBC
HCT: 37 % — ABNORMAL LOW (ref 40.0–52.0)
Hemoglobin: 12.4 g/dL — ABNORMAL LOW (ref 13.0–18.0)
MCH: 32.1 pg (ref 26.0–34.0)
MCHC: 33.5 g/dL (ref 32.0–36.0)
MCV: 95.9 fL (ref 80.0–100.0)
PLATELETS: 120 10*3/uL — AB (ref 150–440)
RBC: 3.85 MIL/uL — ABNORMAL LOW (ref 4.40–5.90)
RDW: 14 % (ref 11.5–14.5)
WBC: 6.2 10*3/uL (ref 3.8–10.6)

## 2015-01-10 LAB — TROPONIN I
TROPONIN I: 0.08 ng/mL — AB (ref <0.031)
TROPONIN I: 0.1 ng/mL — AB (ref <0.031)
Troponin I: 0.06 ng/mL — ABNORMAL HIGH (ref <0.031)

## 2015-01-10 LAB — BASIC METABOLIC PANEL
Anion gap: 9 (ref 5–15)
BUN: 27 mg/dL — AB (ref 6–20)
CALCIUM: 9.2 mg/dL (ref 8.9–10.3)
CHLORIDE: 105 mmol/L (ref 101–111)
CO2: 24 mmol/L (ref 22–32)
CREATININE: 1.89 mg/dL — AB (ref 0.61–1.24)
GFR calc non Af Amer: 32 mL/min — ABNORMAL LOW (ref >60)
GFR, EST AFRICAN AMERICAN: 37 mL/min — AB (ref >60)
GLUCOSE: 98 mg/dL (ref 65–99)
Potassium: 4.2 mmol/L (ref 3.5–5.1)
Sodium: 138 mmol/L (ref 135–145)

## 2015-01-10 LAB — GLUCOSE, CAPILLARY
GLUCOSE-CAPILLARY: 138 mg/dL — AB (ref 65–99)
GLUCOSE-CAPILLARY: 88 mg/dL (ref 65–99)
GLUCOSE-CAPILLARY: 95 mg/dL (ref 65–99)
Glucose-Capillary: 110 mg/dL — ABNORMAL HIGH (ref 65–99)

## 2015-01-10 LAB — HEMOGLOBIN A1C: HEMOGLOBIN A1C: 5.7 % (ref 4.0–6.0)

## 2015-01-10 MED ORDER — DEXTROSE 5 % IV SOLN
2.0000 g | INTRAVENOUS | Status: DC
  Administered 2015-01-11: 2 g via INTRAVENOUS
  Filled 2015-01-10: qty 2

## 2015-01-10 MED ORDER — DEXTROSE 5 % IV SOLN
2.0000 g | INTRAVENOUS | Status: DC
  Administered 2015-01-10: 2 g via INTRAVENOUS
  Filled 2015-01-10 (×2): qty 2

## 2015-01-10 NOTE — Progress Notes (Signed)
[facility] [place] NAME: Bryce Clark    MR#:  [id number]  DATE OF BIRTH:  02-08-1933  SUBJECTIVE: I feel much better CHIEF COMPLAINT:   Chief Complaint  Patient presents with  . Shortness of Breath    x 1 week   patient is a 81 year old Caucasian male with past history significant for history of CHF, hypertension, permanent pacemaker placement, diabetes, coronary artery disease without cardiomyopathy, bladder cancer with chronic indwelling urostomy who presents to the hospital with complaints of cough and chest congestion. He admits of having some phlegm production of clear color. He stated that he did not feel well yesterday. This way presented to the hospital for further evaluation where he was noted to have pyuria, concerning for UTI and was admitted for antibiotic therapy. He feels good today.   Review of Systems  Constitutional: Negative for fever, chills and weight loss.  HENT: Negative for congestion.   Eyes: Negative for blurred vision and double vision.  Respiratory: Positive for cough and sputum production. Negative for shortness of breath and wheezing.   Cardiovascular: Negative for chest pain, palpitations, orthopnea, leg swelling and PND.  Gastrointestinal: Negative for nausea, vomiting, abdominal pain, diarrhea, constipation and blood in stool.  Genitourinary: Negative for dysuria, urgency, frequency and hematuria.  Musculoskeletal: Negative for falls.  Neurological: Negative for dizziness, tremors, focal weakness and headaches.  Endo/Heme/Allergies: Does not bruise/bleed easily.  Psychiatric/Behavioral: Negative for depression. The patient does not have insomnia.     VITAL SIGNS: Blood pressure 125/63, pulse 66, temperature 98 F (36.7 C), temperature source Oral, resp. rate 17, height 5\' 10"  (1.778 m), weight 74.027 kg (163 lb 3.2 oz), SpO2 95 %.  PHYSICAL EXAMINATION:   GENERAL:  81 y.o.-year-old patient lying  in the bed with no acute distress.  EYES: Pupils equal, round, reactive to light and accommodation. No scleral icterus. Extraocular muscles intact.  HEENT: Head atraumatic, normocephalic. Oropharynx and nasopharynx clear.  NECK:  Supple, no jugular venous distention. No thyroid enlargement, no tenderness.  LUNGS: Normal breath sounds bilaterally, no wheezing, rales,rhonchi or crepitation. No use of accessory muscles of respiration.  CARDIOVASCULAR: S1, S2 normal. No murmurs, rubs, or gallops.  ABDOMEN: Soft, nontender, nondistended. Bowel sounds present. No organomegaly or mass.  EXTREMITIES: No pedal edema, cyanosis, or clubbing.  NEUROLOGIC: Cranial nerves II through XII are intact. Muscle strength 5/5 in all extremities. Sensation intact. Gait not checked.  PSYCHIATRIC: The patient is alert and oriented x 3.  SKIN: No obvious rash, lesion, or ulcer.   ORDERS/RESULTS REVIEWED:   CBC  Recent Labs Lab 01/09/15 1928 01/10/15 0614  WBC 7.7 6.2  HGB 13.0 12.4*  HCT 38.1* 37.0*  PLT 133* 120*  MCV 94.3 95.9  MCH 32.3 32.1  MCHC 34.3 33.5  RDW 14.0 14.0  LYMPHSABS 0.9*  --   MONOABS 0.7  --   EOSABS 0.3  --   BASOSABS 0.1  --    ------------------------------------------------------------------------------------------------------------------  Chemistries   Recent Labs Lab 01/09/15 1928 01/10/15 0614  NA 130* 138  K 3.9 4.2  CL 99* 105  CO2 22 24  GLUCOSE 108* 98  BUN 25* 27*  CREATININE 1.81* 1.89*  CALCIUM 9.2 9.2  AST 21  --   ALT 15*  --   ALKPHOS 74  --   BILITOT 0.9  --    ------------------------------------------------------------------------------------------------------------------ estimated creatinine clearance is 32.2 mL/min (by C-G formula based on Cr of 1.89). ------------------------------------------------------------------------------------------------------------------ No results for  input(s): TSH, T4TOTAL, T3FREE, THYROIDAB in the last 72  hours.  Invalid input(s): FREET3  Cardiac Enzymes  Recent Labs Lab 01/09/15 1928 01/10/15 0007 01/10/15 0614  TROPONINI 0.14* 0.10* 0.08*   ------------------------------------------------------------------------------------------------------------------ Invalid input(s): POCBNP ---------------------------------------------------------------------------------------------------------------  RADIOLOGY: Dg Chest 2 View  01/09/2015   CLINICAL DATA:  Shortness of breath for 1 week with bloody stools and weakness for 2 days. Initial encounter.  EXAM: CHEST  2 VIEW  COMPARISON:  PET-CT 06/21/2014.  Chest radiograph 05/27/2009.  FINDINGS: Left subclavian AICD leads appear unchanged within the right atrium, right ventricle and coronary sinus. The heart size and mediastinal contours are stable. There is aortic atherosclerosis. The lungs are clear. There is no pleural effusion or pneumothorax. The bones appear unchanged.  IMPRESSION: No acute cardiopulmonary process.   Electronically Signed   By: Richardean Sale M.D.   On: 01/09/2015 20:28    EKG:  Orders placed or performed during the hospital encounter of 01/09/15  . EKG 12-Lead  . EKG 12-Lead    ASSESSMENT AND PLAN:  Principal Problem:   UTI (urinary tract infection) Active Problems:   Elevated troponin   Chronic combined systolic and diastolic CHF (congestive heart failure) (HCC)   Type 2 diabetes mellitus (HCC)   CAD (coronary artery disease)   HTN (hypertension)   CKD (chronic kidney disease), stage III 1. Pyuria, likely urinary tract infection. Urine cultures are taken. Continue antibiotic therapy with Rocephin 2. Dyspnea, likely due to chronic bronchitis, continue Rocephin, get sputum cultures if possible 3. CK D stage III, seems to be stable with therapy 4. Elevated troponin, continue aspirin, metoprolol, Imdur, get cardiologist involved for further recommendations, get echo   Management plans discussed with the patient,  family and they are in agreement.   DRUG ALLERGIES:  Allergies  Allergen Reactions  . Azithromycin Other (See Comments)    Reaction:  Cold sweats   . Codeine Nausea And Vomiting  . Oxycodone-Acetaminophen Nausea And Vomiting  . Xarelto [Rivaroxaban] Other (See Comments)    Reaction:  Bleeding     CODE STATUS:     Code Status Orders        Start     Ordered   01/09/15 2359  Full code   Continuous     01/09/15 2358      TOTAL TIME TAKING CARE OF THIS PATIENT: 45 minutes.  Prolonged discussion with patient about treatment plan and discharge planning. All questions were answered, he voiced understanding, time spent about 10 minutes for discussions  Clayburn Weekly M.D on 01/10/2015 at 10:12 AM  Between 7am to 6pm - Pager - [phone]  After 6pm go to [email or web address] - password [email or web address] [facility]  Office  [phone]  CC: Primary care physician; Volanda Napoleon, MD

## 2015-01-10 NOTE — Progress Notes (Signed)
ANTIBIOTIC CONSULT NOTE - INITIAL  Pharmacy Consult for ceftriaxone dosing Indication: UTI  Allergies  Allergen Reactions  . Azithromycin Other (See Comments)    Reaction:  Cold sweats   . Codeine Nausea And Vomiting  . Oxycodone-Acetaminophen Nausea And Vomiting  . Xarelto [Rivaroxaban] Other (See Comments)    Reaction:  Bleeding     Patient Measurements: Height: 5\' 10"  (177.8 cm) Weight: 163 lb 3.2 oz (74.027 kg) IBW/kg (Calculated) : 73 Adjusted Body Weight: n/a  Vital Signs: Temp: 98.2 F (36.8 C) (10/04 0008) Temp Source: Oral (10/04 0008) BP: 155/77 mmHg (10/04 0008) Pulse Rate: 69 (10/04 0008) Intake/Output from previous day:   Intake/Output from this shift:    Labs:  Recent Labs  01/09/15 1928  WBC 7.7  HGB 13.0  PLT 133*  CREATININE 1.81*   Estimated Creatinine Clearance: 33.6 mL/min (by C-G formula based on Cr of 1.81). No results for input(s): VANCOTROUGH, VANCOPEAK, VANCORANDOM, GENTTROUGH, GENTPEAK, GENTRANDOM, TOBRATROUGH, TOBRAPEAK, TOBRARND, AMIKACINPEAK, AMIKACINTROU, AMIKACIN in the last 72 hours.   Microbiology: No results found for this or any previous visit (from the past 720 hour(s)).  Medical History: Past Medical History  Diagnosis Date  . Dysrhythmia   . Chronic combined systolic and diastolic CHF (congestive heart failure) ([place])   . Hypertension   . Presence of permanent cardiac pacemaker   . GERD (gastroesophageal reflux disease)   . Arthritis   . Diabetes mellitus without complication ([place])   . CAD (coronary artery disease)   . Gout   . Prostate cancer ([place])   . Cardiomyopathy ([place])   . Chronic renal insufficiency   . History of permanent cardiac pacemaker placement   . COPD (chronic obstructive pulmonary disease) ([place])   . AICD (automatic cardioverter/defibrillator) present   . Myocardial infarction ([place])   . Bladder cancer (HCC)     Medications:   Assessment: Blood and urine cx pending UA: LE(+) NO2(-) WBC  TNTC  Goal of Therapy:  Resolution of infection  Plan:  Ceftriaxone 2 grams q 24 hours ordered.  Axell Trigueros S 01/10/2015,12:36 AM

## 2015-01-11 ENCOUNTER — Inpatient Hospital Stay: Admit: 2015-01-11 | Payer: Medicare HMO

## 2015-01-11 DIAGNOSIS — R0600 Dyspnea, unspecified: Secondary | ICD-10-CM

## 2015-01-11 LAB — CBC
HCT: 36.7 % — ABNORMAL LOW (ref 40.0–52.0)
Hemoglobin: 12.3 g/dL — ABNORMAL LOW (ref 13.0–18.0)
MCH: 32.2 pg (ref 26.0–34.0)
MCHC: 33.5 g/dL (ref 32.0–36.0)
MCV: 95.9 fL (ref 80.0–100.0)
PLATELETS: 132 10*3/uL — AB (ref 150–440)
RBC: 3.83 MIL/uL — ABNORMAL LOW (ref 4.40–5.90)
RDW: 14 % (ref 11.5–14.5)
WBC: 6.6 10*3/uL (ref 3.8–10.6)

## 2015-01-11 LAB — GLUCOSE, CAPILLARY: Glucose-Capillary: 85 mg/dL (ref 65–99)

## 2015-01-11 MED ORDER — CEPHALEXIN 500 MG PO CAPS: 500.0000 mg | ORAL_CAPSULE | Freq: Two times a day (BID) | ORAL | Status: DC

## 2015-01-11 NOTE — Progress Notes (Signed)
Pt a&o, VSS, AV Paced on tele with no complaints of pain. Cleared by cardiology. Order to d/c home with po abx. Discharge instructions given to pt with verbal acknowledgment of understanding. Pt awaiting wheelchair from volunteer services.

## 2015-01-11 NOTE — Consult Note (Signed)
Primary Cardiologist: Dr. Neoma Laming    Reason for Consultation : Elevated troponin   HPI : This is a 81yo M with PMHx mild CAD, hx of CHF (systolic and diastolic now wnl), HTN, and HL who presented to ER with dysuria, he was found to have very mildly elevated troponin, thus cardiology was consulted. He denies CP, pressure, tightness, SOB at any point recently or during hospitalization.         Review of Systems: General: negative for chills, fever, night sweats or weight changes.  Cardiovascular: negative for chest pain, edema, orthopnea, palpitations, paroxysmal nocturnal dyspnea, shortness of breath or dyspnea on exertion Dermatological: negative for rash Respiratory: negative for cough or wheezing Urologic: negative for hematuria Abdominal: negative for nausea, vomiting, diarrhea, bright red blood per rectum, melena, or hematemesis Neurologic: negative for visual changes, syncope, or dizziness All other systems reviewed and are otherwise negative except as noted above.    Past Medical History  Diagnosis Date  . Dysrhythmia   . Chronic combined systolic and diastolic CHF (congestive heart failure) ([place])   . Hypertension   . Presence of permanent cardiac pacemaker   . GERD (gastroesophageal reflux disease)   . Arthritis   . Diabetes mellitus without complication ([place])   . CAD (coronary artery disease)   . Gout   . Prostate cancer ([place])   . Cardiomyopathy ([place])   . Chronic renal insufficiency   . History of permanent cardiac pacemaker placement   . COPD (chronic obstructive pulmonary disease) ([place])   . AICD (automatic cardioverter/defibrillator) present   . Myocardial infarction ([place])   . Bladder cancer ([place])     Medications Prior to Admission  Medication Sig Dispense Refill  . aspirin EC 81 MG tablet Take 81 mg by mouth daily.    . cetirizine (ZYRTEC) 10 MG tablet Take 10 mg by mouth daily.    . colchicine 0.6 MG tablet Take 0.6 mg by mouth daily.    .  hydrALAZINE (APRESOLINE) 50 MG tablet Take 50 mg by mouth 2 (two) times daily.    . insulin glargine (LANTUS) 100 UNIT/ML injection Inject 10 Units into the skin at bedtime as needed (for high blood sugar).    . isosorbide mononitrate (IMDUR) 30 MG 24 hr tablet Take 30 mg by mouth daily.    . metoprolol (LOPRESSOR) 50 MG tablet Take 50 mg by mouth 2 (two) times daily.    . saxagliptin HCl (ONGLYZA) 5 MG TABS tablet Take 5 mg by mouth daily.    . sodium chloride (OCEAN) 0.65 % SOLN nasal spray Place 1 spray into both nostrils as needed for congestion.    . sucralfate (CARAFATE) 1 G tablet Take 1 g by mouth 4 (four) times daily.     . traZODone (DESYREL) 100 MG tablet Take 100 mg by mouth at bedtime.       Marland Kitchen aspirin EC  81 mg Oral Daily  . cefTRIAXone (ROCEPHIN) IVPB 2 gram/50 mL D5W ([place])  2 g Intravenous Q24H  . heparin  5,000 Units Subcutaneous 3 times per day  . hydrALAZINE  50 mg Oral BID  . insulin aspart  0-9 Units Subcutaneous Q6H  . isosorbide mononitrate  30 mg Oral Daily  . metoprolol  50 mg Oral BID  . sodium chloride  3 mL Intravenous Q12H  . traZODone  100 mg Oral QHS    Infusions:    Allergies  Allergen Reactions  . Azithromycin Other (See Comments)  Reaction:  Cold sweats   . Codeine Nausea And Vomiting  . Oxycodone-Acetaminophen Nausea And Vomiting  . Xarelto [Rivaroxaban] Other (See Comments)    Reaction:  Bleeding     Social History   Social History  . Marital Status: Divorced    Spouse Name: N/A  . Number of Children: N/A  . Years of Education: N/A   Occupational History  . Not on file.   Social History Main Topics  . Smoking status: Former Smoker -- 0.25 packs/day for 1 years  . Smokeless tobacco: Not on file  . Alcohol Use: No  . Drug Use: No  . Sexual Activity: Not on file   Other Topics Concern  . Not on file   Social History Narrative    Family History  Problem Relation Age of Onset  . Stroke Father     PHYSICAL EXAM: Filed  Vitals:   01/11/15 0951  BP: 126/60  Pulse: 62  Temp: 97.7 F (36.5 C)  Resp: 17     Intake/Output Summary (Last 24 hours) at 01/11/15 1212 Last data filed at 01/11/15 0900  Gross per 24 hour  Intake    120 ml  Output   1200 ml  Net  -1080 ml    General:  Well appearing. No respiratory difficulty HEENT: normal Neck: supple. no JVD. Carotids 2+ bilat; no bruits. No lymphadenopathy or thryomegaly appreciated. Cor: PMI nondisplaced. Regular rate & rhythm. No rubs, gallops or murmurs. Lungs: clear Abdomen: soft, nontender, nondistended. No hepatosplenomegaly. No bruits or masses. Good bowel sounds. Extremities: no cyanosis, clubbing, rash, edema Neuro: alert & oriented x 3, cranial nerves grossly intact. moves all 4 extremities w/o difficulty. Affect pleasant.  ECG: VVI paced 75 BPM NS ST/T changes   Results for orders placed or performed during the hospital encounter of 01/09/15 (from the past 24 hour(s))  Glucose, capillary     Status: Abnormal   Collection Time: 01/10/15  5:27 PM  Result Value Ref Range   Glucose-Capillary 138 (H) 65 - 99 mg/dL   Comment 1 Notify RN   Glucose, capillary     Status: Abnormal   Collection Time: 01/10/15 11:50 PM  Result Value Ref Range   Glucose-Capillary 110 (H) 65 - 99 mg/dL  CBC     Status: Abnormal   Collection Time: 01/11/15  4:49 AM  Result Value Ref Range   WBC 6.6 3.8 - 10.6 K/uL   RBC 3.83 (L) 4.40 - 5.90 MIL/uL   Hemoglobin 12.3 (L) 13.0 - 18.0 g/dL   HCT 36.7 (L) 40.0 - 52.0 %   MCV 95.9 80.0 - 100.0 fL   MCH 32.2 26.0 - 34.0 pg   MCHC 33.5 32.0 - 36.0 g/dL   RDW 14.0 11.5 - 14.5 %   Platelets 132 (L) 150 - 440 K/uL  Glucose, capillary     Status: None   Collection Time: 01/11/15  5:54 AM  Result Value Ref Range   Glucose-Capillary 85 65 - 99 mg/dL   Dg Chest 2 View  01/09/2015   CLINICAL DATA:  Shortness of breath for 1 week with bloody stools and weakness for 2 days. Initial encounter.  EXAM: CHEST  2 VIEW   COMPARISON:  PET-CT 06/21/2014.  Chest radiograph 05/27/2009.  FINDINGS: Left subclavian AICD leads appear unchanged within the right atrium, right ventricle and coronary sinus. The heart size and mediastinal contours are stable. There is aortic atherosclerosis. The lungs are clear. There is no pleural effusion or pneumothorax. The bones  appear unchanged.  IMPRESSION: No acute cardiopulmonary process.   Electronically Signed   By: Richardean Sale M.D.   On: 01/09/2015 20:28     ASSESSMENT: Mildy elevated troponin 2/2 demand ischemia   PLAN/DISCUSSION: No CP or SOB, pt was tested recently in our office by CTA coronaries and echo which showed mild mid LAD disease, EF 74%, grade 1 diastolic dysfunction, and normal WM. Pt is very reliable for f/u. Ok to d/c from cards stand point. He has appt in our office with PCP and cards 10/18.    Patient and plan discussed with supervising provider, Dr. Neoma Laming, who agrees with above findings.   Bryce Clark, [place] 01/11/2015 12:12 PM

## 2015-01-11 NOTE — Discharge Summary (Signed)
[facility] NAME: Bryce Clark    MR#:  [id number]  DATE OF BIRTH:  [birth year]  DATE OF ADMISSION:  01/09/2015 ADMITTING PHYSICIAN: Lance Coon, MD  DATE OF DISCHARGE: 01/11/2015  1:07 PM  PRIMARY CARE PHYSICIAN: Volanda Napoleon, MD     ADMISSION DIAGNOSIS:  SOB (shortness of breath) [R06.02] NSTEMI (non-ST elevated myocardial infarction) ([place]) [I21.4]  DISCHARGE DIAGNOSIS:  Principal Problem:   UTI (urinary tract infection) Active Problems:   Dyspnea   Elevated troponin   CAD (coronary artery disease)   Chronic combined systolic and diastolic CHF (congestive heart failure) ([facility])   Type 2 diabetes mellitus ([facility])   HTN (hypertension)   CKD (chronic kidney disease), stage III   SECONDARY DIAGNOSIS:   Past Medical History  Diagnosis Date  . Dysrhythmia   . Chronic combined systolic and diastolic CHF (congestive heart failure) ([place])   . Hypertension   . Presence of permanent cardiac pacemaker   . GERD (gastroesophageal reflux disease)   . Arthritis   . Diabetes mellitus without complication ([place])   . CAD (coronary artery disease)   . Gout   . Prostate cancer ([place])   . Cardiomyopathy ([place])   . Chronic renal insufficiency   . History of permanent cardiac pacemaker placement   . COPD (chronic obstructive pulmonary disease) ([place])   . AICD (automatic cardioverter/defibrillator) present   . Myocardial infarction ([place])   . Bladder cancer ([place])     .pro HOSPITAL COURSE:  patient is a 81 year old Caucasian male with past history significant for history of CHF, hypertension, permanent pacemaker placement, diabetes, coronary artery disease without cardiomyopathy, bladder cancer with chronic indwelling urostomy who presents to the hospital with complaints of cough and chest congestion. He admited of having some phlegm production of clear color. On arrival to the hospital he was noted to have pyuria,  concerning for UTI and was admitted for antibiotic therapy. For minimally Elevated troponin.,  Patient was evaluated by cardiologist and recommended outpatient follow-up Discussion by problem 1. Pyuria, likely urinary tract infection. Urine cultures are taken, pending. A shin clinically improved. Continue antibiotic therapy with Keflex as outpatient to complete 3 day course 2. Dyspnea, likely due to chronic bronchitis, resolved, patient was seen by cardiologist who felt that patient did not have acute coronary syndrome related dyspnea. Patient is to follow up with cardiologist as outpatient for further recommendations 3. CK D stage III, stable with therapy 4. Elevated troponin, continue aspirin, metoprolol, Imdur, appreciate cardiologist  recommendations, no further evaluations were recommended. Patient is to follow-up with cardiologist as outpatient DISCHARGE CONDITIONS:   Stable  CONSULTS OBTAINED:  Treatment Team:  Dionisio David, MD  DRUG ALLERGIES:   Allergies  Allergen Reactions  . Azithromycin Other (See Comments)    Reaction:  Cold sweats   . Codeine Nausea And Vomiting  . Oxycodone-Acetaminophen Nausea And Vomiting  . Xarelto [Rivaroxaban] Other (See Comments)    Reaction:  Bleeding     DISCHARGE MEDICATIONS:   Discharge Medication List as of 01/11/2015 11:15 AM    START taking these medications   Details  cephALEXin (KEFLEX) 500 MG capsule Take 1 capsule (500 mg total) by mouth 2 (two) times daily., Starting 01/11/2015, Until Discontinued, Normal      CONTINUE these medications which have NOT CHANGED   Details  aspirin EC 81 MG tablet Take 81 mg by mouth daily., Until Discontinued, Historical Med    cetirizine (ZYRTEC) 10 MG  tablet Take 10 mg by mouth daily., Until Discontinued, Historical Med    colchicine 0.6 MG tablet Take 0.6 mg by mouth daily., Until Discontinued, Historical Med    hydrALAZINE (APRESOLINE) 50 MG tablet Take 50 mg by mouth 2 (two) times daily.,  Until Discontinued, Historical Med    insulin glargine (LANTUS) 100 UNIT/ML injection Inject 10 Units into the skin at bedtime as needed (for high blood sugar)., Until Discontinued, Historical Med    isosorbide mononitrate (IMDUR) 30 MG 24 hr tablet Take 30 mg by mouth daily., Until Discontinued, Historical Med    metoprolol (LOPRESSOR) 50 MG tablet Take 50 mg by mouth 2 (two) times daily., Until Discontinued, Historical Med    saxagliptin HCl (ONGLYZA) 5 MG TABS tablet Take 5 mg by mouth daily., Until Discontinued, Historical Med    sodium chloride (OCEAN) 0.65 % SOLN nasal spray Place 1 spray into both nostrils as needed for congestion., Until Discontinued, Historical Med    sucralfate (CARAFATE) 1 G tablet Take 1 g by mouth 4 (four) times daily. , Until Discontinued, Historical Med    traZODone (DESYREL) 100 MG tablet Take 100 mg by mouth at bedtime., Until Discontinued, Historical Med         DISCHARGE INSTRUCTIONS:    Patient is to follow-up with primary care physician and cardiologist as outpatient within 1 week after discharge  If you experience worsening of your admission symptoms, develop shortness of breath, life threatening emergency, suicidal or homicidal thoughts you must seek medical attention immediately by calling 911 or calling your MD immediately  if symptoms less severe.  You Must read complete instructions/literature along with all the possible adverse reactions/side effects for all the Medicines you take and that have been prescribed to you. Take any new Medicines after you have completely understood and accept all the possible adverse reactions/side effects.   Please note  You were cared for by a hospitalist during your hospital stay. If you have any questions about your discharge medications or the care you received while you were in the hospital after you are discharged, you can call the unit and asked to speak with the hospitalist on call if the hospitalist  that took care of you is not available. Once you are discharged, your primary care physician will handle any further medical issues. Please note that NO REFILLS for any discharge medications will be authorized once you are discharged, as it is imperative that you return to your primary care physician (or establish a relationship with a primary care physician if you do not have one) for your aftercare needs so that they can reassess your need for medications and monitor your lab values.    Today   CHIEF COMPLAINT:   Chief Complaint  Patient presents with  . Shortness of Breath    x 1 week    HISTORY OF PRESENT ILLNESS:  Bryce Clark  is a 81 y.o. male with a known history of CHF, hypertension, permanent pacemaker placement, diabetes, coronary artery disease without cardiomyopathy, bladder cancer with chronic indwelling urostomy who presents to the hospital with complaints of cough and chest congestion. He admited of having some phlegm production of clear color. On arrival to the hospital he was noted to have pyuria, concerning for UTI and was admitted for antibiotic therapy. For minimally Elevated troponin.,  Patient was evaluated by cardiologist and recommended outpatient follow-up Discussion by problem 1. Pyuria, likely urinary tract infection. Urine cultures are taken, pending. A shin clinically improved. Continue antibiotic  therapy with Keflex as outpatient to complete 3 day course 2. Dyspnea, likely due to chronic bronchitis, resolved, patient was seen by cardiologist who felt that patient did not have acute coronary syndrome related dyspnea. Patient is to follow up with cardiologist as outpatient for further recommendations 3. CK D stage III, stable with therapy 4. Elevated troponin, continue aspirin, metoprolol, Imdur, appreciate cardiologist  recommendations, no further evaluations were recommended. Patient is to follow-up with cardiologist as outpatient    VITAL SIGNS:  Blood  pressure 126/60, pulse 62, temperature 97.7 F (36.5 C), temperature source Oral, resp. rate 17, height 5\' 10"  (1.778 m), weight 73.483 kg (162 lb), SpO2 94 %.  I/O:   Intake/Output Summary (Last 24 hours) at 01/11/15 1521 Last data filed at 01/11/15 0900  Gross per 24 hour  Intake    120 ml  Output    900 ml  Net   -780 ml    PHYSICAL EXAMINATION:  GENERAL:  81 y.o.-year-old patient lying in the bed with no acute distress.  EYES: Pupils equal, round, reactive to light and accommodation. No scleral icterus. Extraocular muscles intact.  HEENT: Head atraumatic, normocephalic. Oropharynx and nasopharynx clear.  NECK:  Supple, no jugular venous distention. No thyroid enlargement, no tenderness.  LUNGS: Normal breath sounds bilaterally, no wheezing, rales,rhonchi or crepitation. No use of accessory muscles of respiration.  CARDIOVASCULAR: S1, S2 normal. No murmurs, rubs, or gallops.  ABDOMEN: Soft, non-tender, non-distended. Bowel sounds present. No organomegaly or mass.  EXTREMITIES: No pedal edema, cyanosis, or clubbing.  NEUROLOGIC: Cranial nerves II through XII are intact. Muscle strength 5/5 in all extremities. Sensation intact. Gait not checked.  PSYCHIATRIC: The patient is alert and oriented x 3.  SKIN: No obvious rash, lesion, or ulcer.   DATA REVIEW:   CBC  Recent Labs Lab 01/11/15 0449  WBC 6.6  HGB 12.3*  HCT 36.7*  PLT 132*    Chemistries   Recent Labs Lab 01/09/15 1928 01/10/15 0614  NA 130* 138  K 3.9 4.2  CL 99* 105  CO2 22 24  GLUCOSE 108* 98  BUN 25* 27*  CREATININE 1.81* 1.89*  CALCIUM 9.2 9.2  AST 21  --   ALT 15*  --   ALKPHOS 74  --   BILITOT 0.9  --     Cardiac Enzymes  Recent Labs Lab 01/10/15 1204  TROPONINI 0.06*    Microbiology Results  Results for orders placed or performed during the hospital encounter of 01/09/15  Blood culture (routine x 2)     Status: None (Preliminary result)   Collection Time: 01/09/15  7:28 PM  Result  Value Ref Range Status   Specimen Description BLOOD LEFT ARM  Final   Special Requests BAA,10ML,ANA,AER  Final   Culture NO GROWTH 2 DAYS  Final   Report Status PENDING  Incomplete  Urine culture     Status: None (Preliminary result)   Collection Time: 01/09/15  7:28 PM  Result Value Ref Range Status   Specimen Description URINE, CATHETERIZED  Final   Special Requests Normal  Final   Culture   Final    >=100,000 COLONIES/mL GRAM NEGATIVE RODS IDENTIFICATION AND SUSCEPTIBILITIES TO FOLLOW TWO DIFFERENT GRAM NEGATIVE RODS BEING IDENTIFIED    Report Status PENDING  Incomplete  Blood culture (routine x 2)     Status: None (Preliminary result)   Collection Time: 01/09/15  7:30 PM  Result Value Ref Range Status   Specimen Description BLOOD LEFT ARM  Final  Special Requests BAA,8ML,ANA,AER  Final   Culture NO GROWTH 2 DAYS  Final   Report Status PENDING  Incomplete    RADIOLOGY:  Dg Chest 2 View  01/09/2015   CLINICAL DATA:  Shortness of breath for 1 week with bloody stools and weakness for 2 days. Initial encounter.  EXAM: CHEST  2 VIEW  COMPARISON:  PET-CT 06/21/2014.  Chest radiograph 05/27/2009.  FINDINGS: Left subclavian AICD leads appear unchanged within the right atrium, right ventricle and coronary sinus. The heart size and mediastinal contours are stable. There is aortic atherosclerosis. The lungs are clear. There is no pleural effusion or pneumothorax. The bones appear unchanged.  IMPRESSION: No acute cardiopulmonary process.   Electronically Signed   By: Richardean Sale M.D.   On: 01/09/2015 20:28    EKG:   Orders placed or performed during the hospital encounter of 01/09/15  . EKG 12-Lead  . EKG 12-Lead      Management plans discussed with the patient, family and they are in agreement.  CODE STATUS:     Code Status Orders        Start     Ordered   01/09/15 2359  Full code   Continuous     01/09/15 2358      TOTAL TIME TAKING CARE OF THIS PATIENT: 40  minutes.    Theodoro Grist M.D on 01/11/2015 at 3:21 PM  Between 7am to 6pm - Pager - [phone]  After 6pm go to [email or web address] - password [email or web address] [facility]  Office  [phone]  CC: Primary care physician; Volanda Napoleon, MD

## 2015-01-12 LAB — URINE CULTURE
Culture: >=100000
Special Requests: NORMAL

## 2015-01-14 LAB — CULTURE, BLOOD (ROUTINE X 2)
CULTURE: NO GROWTH
Culture: NO GROWTH

## 2015-01-23 ENCOUNTER — Ambulatory Visit: Payer: Medicare HMO

## 2015-01-25 ENCOUNTER — Inpatient Hospital Stay: Payer: Medicare HMO | Admitting: Oncology

## 2015-02-08 DIAGNOSIS — C675 Malignant neoplasm of bladder neck: Secondary | ICD-10-CM | POA: Diagnosis not present

## 2015-02-09 ENCOUNTER — Inpatient Hospital Stay: Payer: Medicare HMO | Admitting: Oncology

## 2015-03-01 ENCOUNTER — Ambulatory Visit
Admission: RE | Admit: 2015-03-01 | Discharge: 2015-03-01 | Disposition: A | Discharge status: Home / Self Care | Payer: Commercial Managed Care - HMO | Source: Ambulatory Visit | Attending: Oncology | Admitting: Oncology

## 2015-03-01 ENCOUNTER — Ambulatory Visit: Payer: Medicare HMO

## 2015-03-01 DIAGNOSIS — C679 Malignant neoplasm of bladder, unspecified: Secondary | ICD-10-CM | POA: Insufficient documentation

## 2015-03-01 DIAGNOSIS — Z936 Other artificial openings of urinary tract status: Secondary | ICD-10-CM | POA: Diagnosis not present

## 2015-03-01 DIAGNOSIS — N200 Calculus of kidney: Secondary | ICD-10-CM | POA: Insufficient documentation

## 2015-03-01 DIAGNOSIS — Z906 Acquired absence of other parts of urinary tract: Secondary | ICD-10-CM | POA: Diagnosis not present

## 2015-03-07 ENCOUNTER — Inpatient Hospital Stay: Payer: Commercial Managed Care - HMO | Attending: Oncology | Admitting: Oncology

## 2015-03-07 VITALS — BP 149/79 | HR 82 | Temp 96.7°F | Resp 18 | Wt 172.8 lb

## 2015-03-07 DIAGNOSIS — C679 Malignant neoplasm of bladder, unspecified: Secondary | ICD-10-CM | POA: Diagnosis not present

## 2015-03-07 DIAGNOSIS — Z79899 Other long term (current) drug therapy: Secondary | ICD-10-CM | POA: Diagnosis not present

## 2015-03-07 DIAGNOSIS — Z950 Presence of cardiac pacemaker: Secondary | ICD-10-CM | POA: Diagnosis not present

## 2015-03-07 DIAGNOSIS — Z87891 Personal history of nicotine dependence: Secondary | ICD-10-CM | POA: Diagnosis not present

## 2015-03-07 DIAGNOSIS — J449 Chronic obstructive pulmonary disease, unspecified: Secondary | ICD-10-CM | POA: Diagnosis not present

## 2015-03-07 DIAGNOSIS — N289 Disorder of kidney and ureter, unspecified: Secondary | ICD-10-CM | POA: Diagnosis not present

## 2015-03-07 DIAGNOSIS — I429 Cardiomyopathy, unspecified: Secondary | ICD-10-CM | POA: Insufficient documentation

## 2015-03-07 DIAGNOSIS — I2510 Atherosclerotic heart disease of native coronary artery without angina pectoris: Secondary | ICD-10-CM | POA: Insufficient documentation

## 2015-03-07 DIAGNOSIS — N189 Chronic kidney disease, unspecified: Secondary | ICD-10-CM | POA: Insufficient documentation

## 2015-03-07 DIAGNOSIS — Z794 Long term (current) use of insulin: Secondary | ICD-10-CM | POA: Diagnosis not present

## 2015-03-07 DIAGNOSIS — Z7982 Long term (current) use of aspirin: Secondary | ICD-10-CM | POA: Insufficient documentation

## 2015-03-07 DIAGNOSIS — D61818 Other pancytopenia: Secondary | ICD-10-CM

## 2015-03-07 DIAGNOSIS — M109 Gout, unspecified: Secondary | ICD-10-CM | POA: Diagnosis not present

## 2015-03-07 DIAGNOSIS — Z8546 Personal history of malignant neoplasm of prostate: Secondary | ICD-10-CM | POA: Insufficient documentation

## 2015-03-07 DIAGNOSIS — I129 Hypertensive chronic kidney disease with stage 1 through stage 4 chronic kidney disease, or unspecified chronic kidney disease: Secondary | ICD-10-CM | POA: Insufficient documentation

## 2015-03-07 DIAGNOSIS — K219 Gastro-esophageal reflux disease without esophagitis: Secondary | ICD-10-CM | POA: Insufficient documentation

## 2015-03-07 DIAGNOSIS — I252 Old myocardial infarction: Secondary | ICD-10-CM | POA: Diagnosis not present

## 2015-03-07 DIAGNOSIS — I5042 Chronic combined systolic (congestive) and diastolic (congestive) heart failure: Secondary | ICD-10-CM | POA: Diagnosis not present

## 2015-03-07 DIAGNOSIS — E119 Type 2 diabetes mellitus without complications: Secondary | ICD-10-CM | POA: Diagnosis not present

## 2015-03-07 NOTE — Progress Notes (Signed)
Patient her to discuss CT results and does not offer any concerns today.

## 2015-03-14 ENCOUNTER — Inpatient Hospital Stay: Payer: Commercial Managed Care - HMO | Attending: Oncology

## 2015-03-14 NOTE — Progress Notes (Signed)
[place]  Telephone:(336) [phone] Fax:(336) [phone]  ID: Bryce Clark OB: 1932/11/30  MR#: [id number]  [id number]  Patient Care Team: Cletis Athens, MD as PCP - General (Internal Medicine)  CHIEF COMPLAINT:  Chief Complaint  Patient presents with  . Bladder Cancer    INTERVAL HISTORY: Patient returns to clinic today for repeat laboratory work, discussion of the CT scan results, and further evaluation. He currently feels well and is asymptomatic. He does not complain of abdominal pain or bloating today. He denies any easy bleeding or bruising.  He has no neurologic complaints. He denies any recent fevers. He has a good appetite and denies weight loss. He has no chest pain or shortness of breath. He denies any nausea, vomiting, constipation, or diarrhea. Patient offers no specific complaints today.  REVIEW OF SYSTEMS:   Review of Systems  Constitutional: Negative.   Respiratory: Negative.   Cardiovascular: Negative.   Gastrointestinal: Negative.   Genitourinary: Negative.   Musculoskeletal: Negative.   Neurological: Negative.     As per HPI. Otherwise, a complete review of systems is negatve.  PAST MEDICAL HISTORY: Past Medical History  Diagnosis Date  . Dysrhythmia   . Chronic combined systolic and diastolic CHF (congestive heart failure) ([place])   . Hypertension   . Presence of permanent cardiac pacemaker   . GERD (gastroesophageal reflux disease)   . Arthritis   . Diabetes mellitus without complication ([place])   . CAD (coronary artery disease)   . Gout   . Cardiomyopathy ([place])   . Chronic renal insufficiency   . History of permanent cardiac pacemaker placement   . COPD (chronic obstructive pulmonary disease) ([place])   . AICD (automatic cardioverter/defibrillator) present   . Myocardial infarction ([place])   . Prostate cancer ([place])   . Bladder cancer ([place])     PAST SURGICAL HISTORY: Past Surgical History  Procedure Laterality Date  .  Cardiac catheterization    . Ileostomy    . Bladder removal    . Appendectomy    . Insert / replace / remove pacemaker    . Total hip arthroplasty    . Cataract extraction w/phaco Right 10/17/2014    Procedure: CATARACT EXTRACTION PHACO AND INTRAOCULAR LENS PLACEMENT (IOC);  Surgeon: Estill Cotta, MD;  Location: [facility] ORS;  Service: Ophthalmology;  Laterality: Right;  [place] 01:47 AP%61.8 CDE49.41 fluid pack lot # [id number]  . Revision urostomy cutaneous    . Cataract extraction w/phaco Left 09/12/2014    Procedure: CATARACT EXTRACTION PHACO AND INTRAOCULAR LENS PLACEMENT (IOC);  Surgeon: Estill Cotta, MD;  Location: [facility] ORS;  Service: Ophthalmology;  Laterality: Left;  [place]: 01:11.7 AP: 25.6 CDE: 29.48   . Peripheral vascular catheterization N/A 12/05/2014    Procedure: IVC Filter Insertion;  Surgeon: Algernon Huxley, MD;  Location: [place] [facility];  Service: Cardiovascular;  Laterality: N/A;    FAMILY HISTORY:  Reviewed and unchanged. No reported history of malignancy or chronic disease.     ADVANCED DIRECTIVES:    HEALTH MAINTENANCE: Social History  Substance Use Topics  . Smoking status: Former Smoker -- 0.25 packs/day for 1 years  . Smokeless tobacco: Not on file  . Alcohol Use: No     Colonoscopy:  PAP:  Bone density:  Lipid panel:  Allergies  Allergen Reactions  . Azithromycin Other (See Comments)    Reaction:  Cold sweats   . Codeine Nausea And Vomiting  . Oxycodone-Acetaminophen Nausea And Vomiting  . Xarelto [Rivaroxaban] Other (See Comments)  Reaction:  Bleeding     Current Outpatient Prescriptions  Medication Sig Dispense Refill  . aspirin EC 81 MG tablet Take 81 mg by mouth daily.    . cetirizine (ZYRTEC) 10 MG tablet Take 10 mg by mouth daily.    . colchicine 0.6 MG tablet Take 0.6 mg by mouth daily.    . hydrALAZINE (APRESOLINE) 50 MG tablet Take 50 mg by mouth 2 (two) times daily.    . insulin glargine (LANTUS) 100 UNIT/ML injection Inject 10  Units into the skin at bedtime as needed (for high blood sugar).    . isosorbide mononitrate (IMDUR) 30 MG 24 hr tablet Take 30 mg by mouth daily.    . metoprolol (LOPRESSOR) 50 MG tablet Take 50 mg by mouth 2 (two) times daily.    . saxagliptin HCl (ONGLYZA) 5 MG TABS tablet Take 5 mg by mouth daily.    . sodium chloride (OCEAN) 0.65 % SOLN nasal spray Place 1 spray into both nostrils as needed for congestion.    . sucralfate (CARAFATE) 1 G tablet Take 1 g by mouth 4 (four) times daily as needed.     . traZODone (DESYREL) 100 MG tablet Take 100 mg by mouth at bedtime.    . cephALEXin (KEFLEX) 500 MG capsule Take 1 capsule (500 mg total) by mouth 2 (two) times daily. (Patient not taking: Reported on 03/07/2015) 6 capsule 0   No current facility-administered medications for this visit.   Facility-Administered Medications Ordered in Other Visits  Medication Dose Route Frequency Provider Last Rate Last Dose  . sodium chloride 0.9 % injection 10 mL  10 mL Intravenous PRN Lloyd Huger, MD   10 mL at 10/26/14 1519    OBJECTIVE: Filed Vitals:   03/07/15 1132  BP: 149/79  Pulse: 82  Temp: 96.7 F (35.9 C)  Resp: 18     Body mass index is 24.8 kg/(m^2).    ECOG FS:0 - Asymptomatic  General: Well-developed, well-nourished, no acute distress. Eyes: Pink conjunctiva, anicteric sclera. Lungs: Clear to auscultation bilaterally. Heart: Regular rate and rhythm. No rubs, murmurs, or gallops. Abdomen: Soft, nontender, nondistended. No organomegaly noted, normoactive bowel sounds. Urostomy bag noted draining clear yellow urine. Musculoskeletal: No edema, cyanosis, or clubbing. Neuro: Alert, answering all questions appropriately. Cranial nerves grossly intact. Skin: No rashes or petechiae noted. Psych: Normal affect.   LAB RESULTS:  Lab Results  Component Value Date   NA 138 01/10/2015   K 4.2 01/10/2015   CL 105 01/10/2015   CO2 24 01/10/2015   GLUCOSE 98 01/10/2015   BUN 27*  01/10/2015   CREATININE 1.89* 01/10/2015   CALCIUM 9.2 01/10/2015   PROT 6.7 01/09/2015   ALBUMIN 3.8 01/09/2015   AST 21 01/09/2015   ALT 15* 01/09/2015   ALKPHOS 74 01/09/2015   BILITOT 0.9 01/09/2015   GFRNONAA 32* 01/10/2015   GFRAA 37* 01/10/2015    Lab Results  Component Value Date   WBC 6.6 01/11/2015   NEUTROABS 5.7 01/09/2015   HGB 12.3* 01/11/2015   HCT 36.7* 01/11/2015   MCV 95.9 01/11/2015   PLT 132* 01/11/2015     STUDIES: Ct Abdomen Pelvis Wo Contrast  03/01/2015  CLINICAL DATA:  Restaging bladder cancer. History of proptosis cystectomy and cysto proctectomy. Chemotherapy complete. EXAM: CT ABDOMEN AND PELVIS WITHOUT CONTRAST TECHNIQUE: Multidetector CT imaging of the abdomen and pelvis was performed following the standard protocol without IV contrast. COMPARISON:  Acute PET-CT 06/21/2014 FINDINGS: Lower chest: Small flattened 7  mm nodule RIGHT middle lobe (image number 3, series 4 not changed from prior likely represents a ventral lymph node. Hepatobiliary:  No focal hepatic lesions noncontrast exam. Pancreas: Pancreas is normal. No ductal dilatation. No pancreatic inflammation. Spleen: Normal spleen Adrenals/urinary tract: Adrenal glands are normal. Non IV contrast images demonstrate nonobstructing 5 mm calculus LEFT kidney. No ureterolithiasis. Ileal conduit anatomy. No hydroureter. Patient status post cystectomy. No nodularity in pelvic floor. Patient status post prostatectomy Stomach/Bowel: Stomach, small bowel, appendix, and cecum are normal. The colon and rectosigmoid colon are normal. Vascular/Lymphatic: Abdominal aorta is normal caliber with atherosclerotic calcification. Infrarenal IVC filter noted. There is no retroperitoneal or periportal lymphadenopathy. No pelvic lymphadenopathy. Reproductive: Post prostatectomy Other: Small fat filled inguinal hernias. Musculoskeletal: No aggressive osseous lesion. IMPRESSION: 1. No evidence of bladder cancer or prostate cancer  local recurrence or metastasis in the abdomen pelvis. 2. Patient status post cystectomy with ileal conduit. No complicating features on this noncontrast exam. 3. Small nonobstructing LEFT renal calculus Electronically Signed   By: Suzy Bouchard M.D.   On: 03/01/2015 14:43    ASSESSMENT: Recurrent bladder cancer.  PLAN:    1. Bladder cancer: CT scan results from March 01, 2015 reviewed independently and reported as above with no obvious evidence of recurrence. Continue follow-up with urology as scheduled. Return to clinic in 3 months with repeat laboratory work and further evaluation. Will image every 6 months for the first 2 years. Patient completed his chemotherapy with carboplatin and gemcitabine on May 09, 2014. 2. Renal insufficiency: Patient's creatinine is approximately at his baseline, monitor. 3. Pancytopenia: Nearly resolved. Monitor. 4. History of PET positive colon lesion:  Patient does not wish to undergo colonoscopy at this time.  Patient expressed understanding and was in agreement with this plan. He also understands that He can call clinic at any time with any questions, concerns, or complaints.   Bladder cancer   Staging form: Urinary Bladder, AJCC 7th Edition     Clinical stage from 11/06/2014: Stage IV (T4, N0, M1) - Signed by Lloyd Huger, MD on 11/06/2014   Lloyd Huger, MD   03/14/2015 1:40 PM

## 2015-03-22 DIAGNOSIS — Z936 Other artificial openings of urinary tract status: Secondary | ICD-10-CM | POA: Diagnosis not present

## 2015-03-22 DIAGNOSIS — E119 Type 2 diabetes mellitus without complications: Secondary | ICD-10-CM | POA: Diagnosis not present

## 2015-03-22 DIAGNOSIS — C679 Malignant neoplasm of bladder, unspecified: Secondary | ICD-10-CM | POA: Diagnosis not present

## 2015-04-06 DIAGNOSIS — I509 Heart failure, unspecified: Secondary | ICD-10-CM | POA: Diagnosis not present

## 2015-04-06 DIAGNOSIS — C675 Malignant neoplasm of bladder neck: Secondary | ICD-10-CM | POA: Diagnosis not present

## 2015-04-12 DIAGNOSIS — E1165 Type 2 diabetes mellitus with hyperglycemia: Secondary | ICD-10-CM | POA: Diagnosis not present

## 2015-04-12 DIAGNOSIS — R413 Other amnesia: Secondary | ICD-10-CM | POA: Diagnosis not present

## 2015-04-12 DIAGNOSIS — C675 Malignant neoplasm of bladder neck: Secondary | ICD-10-CM | POA: Diagnosis not present

## 2015-04-12 DIAGNOSIS — I509 Heart failure, unspecified: Secondary | ICD-10-CM | POA: Diagnosis not present

## 2015-04-13 DIAGNOSIS — E784 Other hyperlipidemia: Secondary | ICD-10-CM | POA: Diagnosis not present

## 2015-04-13 DIAGNOSIS — R5381 Other malaise: Secondary | ICD-10-CM | POA: Diagnosis not present

## 2015-04-13 DIAGNOSIS — I10 Essential (primary) hypertension: Secondary | ICD-10-CM | POA: Diagnosis not present

## 2015-04-25 ENCOUNTER — Inpatient Hospital Stay: Payer: Commercial Managed Care - HMO | Attending: Oncology

## 2015-04-25 DIAGNOSIS — C679 Malignant neoplasm of bladder, unspecified: Secondary | ICD-10-CM | POA: Diagnosis not present

## 2015-04-25 DIAGNOSIS — Z452 Encounter for adjustment and management of vascular access device: Secondary | ICD-10-CM | POA: Diagnosis not present

## 2015-04-25 MED ORDER — HEPARIN SOD (PORK) LOCK FLUSH 100 UNIT/ML IV SOLN
INTRAVENOUS | Status: AC
  Filled 2015-04-25: qty 5

## 2015-04-25 MED ORDER — HEPARIN SOD (PORK) LOCK FLUSH 100 UNIT/ML IV SOLN
500.0000 [IU] | Freq: Once | INTRAVENOUS | Status: AC
  Administered 2015-04-25: 500 [IU] via INTRAVENOUS

## 2015-04-25 MED ORDER — SODIUM CHLORIDE 0.9 % IJ SOLN
10.0000 mL | Freq: Once | INTRAMUSCULAR | Status: AC
  Administered 2015-04-25: 10 mL via INTRAVENOUS
  Filled 2015-04-25: qty 10

## 2015-05-15 DIAGNOSIS — R6883 Chills (without fever): Secondary | ICD-10-CM | POA: Diagnosis not present

## 2015-05-15 DIAGNOSIS — I739 Peripheral vascular disease, unspecified: Secondary | ICD-10-CM | POA: Diagnosis not present

## 2015-05-15 DIAGNOSIS — T451X5A Adverse effect of antineoplastic and immunosuppressive drugs, initial encounter: Secondary | ICD-10-CM | POA: Diagnosis not present

## 2015-05-15 DIAGNOSIS — G620 Drug-induced polyneuropathy: Secondary | ICD-10-CM | POA: Diagnosis not present

## 2015-05-15 DIAGNOSIS — I2109 ST elevation (STEMI) myocardial infarction involving other coronary artery of anterior wall: Secondary | ICD-10-CM | POA: Diagnosis not present

## 2015-05-16 DIAGNOSIS — R5381 Other malaise: Secondary | ICD-10-CM | POA: Diagnosis not present

## 2015-05-16 DIAGNOSIS — I10 Essential (primary) hypertension: Secondary | ICD-10-CM | POA: Diagnosis not present

## 2015-05-17 ENCOUNTER — Other Ambulatory Visit: Payer: Self-pay | Admitting: Internal Medicine

## 2015-05-17 ENCOUNTER — Ambulatory Visit
Admission: RE | Admit: 2015-05-17 | Discharge: 2015-05-17 | Disposition: A | Discharge status: Home / Self Care | Payer: Commercial Managed Care - HMO | Source: Ambulatory Visit | Attending: Internal Medicine | Admitting: Internal Medicine

## 2015-05-17 DIAGNOSIS — R0602 Shortness of breath: Secondary | ICD-10-CM | POA: Insufficient documentation

## 2015-05-17 DIAGNOSIS — R509 Fever, unspecified: Secondary | ICD-10-CM | POA: Insufficient documentation

## 2015-05-17 DIAGNOSIS — R918 Other nonspecific abnormal finding of lung field: Secondary | ICD-10-CM | POA: Diagnosis not present

## 2015-05-17 DIAGNOSIS — R6883 Chills (without fever): Secondary | ICD-10-CM

## 2015-06-02 ENCOUNTER — Encounter: Payer: Self-pay | Admitting: Ophthalmology

## 2015-06-06 ENCOUNTER — Inpatient Hospital Stay (HOSPITAL_BASED_OUTPATIENT_CLINIC_OR_DEPARTMENT_OTHER): Payer: Commercial Managed Care - HMO | Admitting: Oncology

## 2015-06-06 ENCOUNTER — Inpatient Hospital Stay: Payer: Commercial Managed Care - HMO

## 2015-06-06 ENCOUNTER — Inpatient Hospital Stay: Payer: Commercial Managed Care - HMO | Attending: Oncology

## 2015-06-06 ENCOUNTER — Encounter: Payer: Self-pay | Admitting: Oncology

## 2015-06-06 VITALS — BP 176/93 | HR 76 | Temp 97.9°F | Wt 181.0 lb

## 2015-06-06 DIAGNOSIS — Z8042 Family history of malignant neoplasm of prostate: Secondary | ICD-10-CM

## 2015-06-06 DIAGNOSIS — C679 Malignant neoplasm of bladder, unspecified: Secondary | ICD-10-CM

## 2015-06-06 DIAGNOSIS — Z79899 Other long term (current) drug therapy: Secondary | ICD-10-CM

## 2015-06-06 DIAGNOSIS — Z95828 Presence of other vascular implants and grafts: Secondary | ICD-10-CM

## 2015-06-06 LAB — CBC WITH DIFFERENTIAL/PLATELET
Basophils Absolute: 0.1 10*3/uL (ref 0–0.1)
Basophils Relative: 1 %
Eosinophils Absolute: 1 10*3/uL — ABNORMAL HIGH (ref 0–0.7)
Eosinophils Relative: 13 %
HEMATOCRIT: 42.2 % (ref 40.0–52.0)
HEMOGLOBIN: 14.3 g/dL (ref 13.0–18.0)
LYMPHS ABS: 2 10*3/uL (ref 1.0–3.6)
Lymphocytes Relative: 26 %
MCH: 31.7 pg (ref 26.0–34.0)
MCHC: 33.8 g/dL (ref 32.0–36.0)
MCV: 94 fL (ref 80.0–100.0)
MONO ABS: 0.6 10*3/uL (ref 0.2–1.0)
MONOS PCT: 8 %
NEUTROS PCT: 52 %
Neutro Abs: 3.9 10*3/uL (ref 1.4–6.5)
Platelets: 129 10*3/uL — ABNORMAL LOW (ref 150–440)
RBC: 4.49 MIL/uL (ref 4.40–5.90)
RDW: 13.8 % (ref 11.5–14.5)
WBC: 7.6 10*3/uL (ref 3.8–10.6)

## 2015-06-06 LAB — COMPREHENSIVE METABOLIC PANEL
ALBUMIN: 4.1 g/dL (ref 3.5–5.0)
ALT: 17 U/L (ref 17–63)
ANION GAP: 6 (ref 5–15)
AST: 19 U/L (ref 15–41)
Alkaline Phosphatase: 56 U/L (ref 38–126)
BUN: 25 mg/dL — ABNORMAL HIGH (ref 6–20)
CO2: 26 mmol/L (ref 22–32)
Calcium: 9.7 mg/dL (ref 8.9–10.3)
Chloride: 100 mmol/L — ABNORMAL LOW (ref 101–111)
Creatinine, Ser: 1.64 mg/dL — ABNORMAL HIGH (ref 0.61–1.24)
GFR calc non Af Amer: 38 mL/min — ABNORMAL LOW (ref >60)
GFR, EST AFRICAN AMERICAN: 44 mL/min — AB (ref >60)
GLUCOSE: 137 mg/dL — AB (ref 65–99)
POTASSIUM: 4.6 mmol/L (ref 3.5–5.1)
SODIUM: 132 mmol/L — AB (ref 135–145)
TOTAL PROTEIN: 7.3 g/dL (ref 6.5–8.1)
Total Bilirubin: 0.8 mg/dL (ref 0.3–1.2)

## 2015-06-06 LAB — PSA: PSA: 0.02 ng/mL (ref 0.00–4.00)

## 2015-06-06 MED ORDER — SODIUM CHLORIDE 0.9% FLUSH
10.0000 mL | INTRAVENOUS | Status: DC | PRN
  Administered 2015-06-06: 10 mL via INTRAVENOUS
  Filled 2015-06-06: qty 10

## 2015-06-06 MED ORDER — HEPARIN SOD (PORK) LOCK FLUSH 100 UNIT/ML IV SOLN
500.0000 [IU] | Freq: Once | INTRAVENOUS | Status: AC
  Administered 2015-06-06: 500 [IU] via INTRAVENOUS

## 2015-06-09 DIAGNOSIS — R0789 Other chest pain: Secondary | ICD-10-CM | POA: Diagnosis not present

## 2015-06-09 DIAGNOSIS — I739 Peripheral vascular disease, unspecified: Secondary | ICD-10-CM | POA: Diagnosis not present

## 2015-06-09 DIAGNOSIS — I429 Cardiomyopathy, unspecified: Secondary | ICD-10-CM | POA: Diagnosis not present

## 2015-06-17 NOTE — Progress Notes (Signed)
[place]  Telephone:(336) [phone] Fax:(336) [phone]  ID: Bryce Clark OB: [birth year]  MR#: [id number]  [id number]  Patient Care Team: Cletis Athens, MD as PCP - General (Internal Medicine)  CHIEF COMPLAINT:  Chief Complaint  Patient presents with  . Bladder Cancer    INTERVAL HISTORY: Patient returns to clinic today for repeat laboratory work and further evaluation. He currently feels well and is asymptomatic. He does not complain of abdominal pain or bloating today. He denies any easy bleeding or bruising.  He has no neurologic complaints. He denies any recent fevers. He has a good appetite and denies weight loss. He has no chest pain or shortness of breath. He denies any nausea, vomiting, constipation, or diarrhea. Patient offers no specific complaints today.  REVIEW OF SYSTEMS:   Review of Systems  Constitutional: Negative.  Negative for fever, weight loss and malaise/fatigue.  Respiratory: Negative.  Negative for shortness of breath.   Cardiovascular: Negative.  Negative for chest pain.  Gastrointestinal: Negative.  Negative for abdominal pain.  Genitourinary: Negative.   Musculoskeletal: Negative.   Neurological: Negative.  Negative for weakness.    As per HPI. Otherwise, a complete review of systems is negatve.  PAST MEDICAL HISTORY: Past Medical History  Diagnosis Date  . Dysrhythmia   . Chronic combined systolic and diastolic CHF (congestive heart failure) ([place])   . Hypertension   . Presence of permanent cardiac pacemaker   . GERD (gastroesophageal reflux disease)   . Arthritis   . Diabetes mellitus without complication ([place])   . CAD (coronary artery disease)   . Gout   . Cardiomyopathy ([place])   . Chronic renal insufficiency   . History of permanent cardiac pacemaker placement   . COPD (chronic obstructive pulmonary disease) ([place])   . AICD (automatic cardioverter/defibrillator) present   . Myocardial infarction ([place])   . Prostate  cancer ([place])   . Bladder cancer ([place])     PAST SURGICAL HISTORY: Past Surgical History  Procedure Laterality Date  . Cardiac catheterization    . Ileostomy    . Bladder removal    . Appendectomy    . Insert / replace / remove pacemaker    . Total hip arthroplasty    . Revision urostomy cutaneous    . Cataract extraction w/phaco Left 09/12/2014    Procedure: CATARACT EXTRACTION PHACO AND INTRAOCULAR LENS PLACEMENT (IOC);  Surgeon: Estill Cotta, MD;  Location: [facility]S;  Service: Ophthalmology;  Laterality: Left;  [place]: 01:11.7 AP: 25.6 CDE: 29.48   . Peripheral vascular catheterization N/A 12/05/2014    Procedure: IVC Filter Insertion;  Surgeon: Algernon Huxley, MD;  Location: [facility];  Service: Cardiovascular;  Laterality: N/A;  . Cataract extraction w/phaco Right 10/17/2014    Procedure: CATARACT EXTRACTION PHACO AND INTRAOCULAR LENS PLACEMENT (IOC);  Surgeon: Estill Cotta, MD;  Location: [facility];  Service: Ophthalmology;  Laterality: Right;  [place] 01:47 AP%61.8 CDE49.41 fluid pack lot # [id number]    FAMILY HISTORY:  Reviewed and unchanged. No reported history of malignancy or chronic disease.     ADVANCED DIRECTIVES:    HEALTH MAINTENANCE: Social History  Substance Use Topics  . Smoking status: Former Smoker -- 0.25 packs/day for 1 years  . Smokeless tobacco: None  . Alcohol Use: No     Colonoscopy:  PAP:  Bone density:  Lipid panel:  Allergies  Allergen Reactions  . Azithromycin Other (See Comments)    Reaction:  Cold sweats   . Codeine Nausea  And Vomiting  . Oxycodone-Acetaminophen Nausea And Vomiting  . Xarelto [Rivaroxaban] Other (See Comments)    Reaction:  Bleeding     Current Outpatient Prescriptions  Medication Sig Dispense Refill  . insulin glargine (LANTUS) 100 UNIT/ML injection Inject 10 Units into the skin at bedtime as needed (for high blood sugar).    . metoprolol (LOPRESSOR) 50 MG tablet Take 50 mg by mouth 2 (two) times daily.      . saxagliptin HCl (ONGLYZA) 5 MG TABS tablet Take 5 mg by mouth daily.    . sodium chloride (OCEAN) 0.65 % SOLN nasal spray Place 1 spray into both nostrils as needed for congestion.    . sucralfate (CARAFATE) 1 G tablet Take 1 g by mouth 4 (four) times daily as needed.     . traZODone (DESYREL) 100 MG tablet Take 100 mg by mouth at bedtime.     No current facility-administered medications for this visit.   Facility-Administered Medications Ordered in Other Visits  Medication Dose Route Frequency Provider Last Rate Last Dose  . sodium chloride 0.9 % injection 10 mL  10 mL Intravenous PRN Lloyd Huger, MD   10 mL at 10/26/14 1519    OBJECTIVE: Filed Vitals:   06/06/15 1100  BP: 176/93  Pulse: 76  Temp: 97.9 F (36.6 C)     Body mass index is 25.97 kg/(m^2).    ECOG FS:0 - Asymptomatic  General: Well-developed, well-nourished, no acute distress. Eyes: Pink conjunctiva, anicteric sclera. Lungs: Clear to auscultation bilaterally. Heart: Regular rate and rhythm. No rubs, murmurs, or gallops. Abdomen: Soft, nontender, nondistended. No organomegaly noted, normoactive bowel sounds. Urostomy bag noted draining clear yellow urine. Musculoskeletal: No edema, cyanosis, or clubbing. Neuro: Alert, answering all questions appropriately. Cranial nerves grossly intact. Skin: No rashes or petechiae noted. Psych: Normal affect.   LAB RESULTS:  Lab Results  Component Value Date   NA 132* 06/06/2015   K 4.6 06/06/2015   CL 100* 06/06/2015   CO2 26 06/06/2015   GLUCOSE 137* 06/06/2015   BUN 25* 06/06/2015   CREATININE 1.64* 06/06/2015   CALCIUM 9.7 06/06/2015   PROT 7.3 06/06/2015   ALBUMIN 4.1 06/06/2015   AST 19 06/06/2015   ALT 17 06/06/2015   ALKPHOS 56 06/06/2015   BILITOT 0.8 06/06/2015   GFRNONAA 38* 06/06/2015   GFRAA 44* 06/06/2015    Lab Results  Component Value Date   WBC 7.6 06/06/2015   NEUTROABS 3.9 06/06/2015   HGB 14.3 06/06/2015   HCT 42.2 06/06/2015    MCV 94.0 06/06/2015   PLT 129* 06/06/2015   Lab Results  Component Value Date   PSA 0.02 06/06/2015     STUDIES: No results found.  ASSESSMENT: Recurrent bladder cancer.  PLAN:    1. Bladder cancer: CT scan results from March 01, 2015 reviewed independently with no obvious evidence of recurrence. Return to clinic in 3 months with repeat laboratory work, imaging and further evaluation. Will image every 6 months for the first 2 years. Patient completed his chemotherapy with carboplatin and gemcitabine on May 09, 2014. 2. Renal insufficiency: Patient's creatinine is approximately at his baseline, monitor. 3. Thrombocytopenia: Mild. Monitor. 4. History of PET positive colon lesion:  Patient does not wish to undergo colonoscopy at this time.  Patient expressed understanding and was in agreement with this plan. He also understands that He can call clinic at any time with any questions, concerns, or complaints.   Bladder cancer   Staging form: Urinary Bladder,  AJCC 7th Edition     Clinical stage from 11/06/2014: Stage IV (T4, N0, M1) - Signed by Lloyd Huger, MD on 11/06/2014   Lloyd Huger, MD   06/17/2015 6:32 AM

## 2015-06-26 DIAGNOSIS — I739 Peripheral vascular disease, unspecified: Secondary | ICD-10-CM | POA: Diagnosis not present

## 2015-06-26 DIAGNOSIS — R079 Chest pain, unspecified: Secondary | ICD-10-CM | POA: Diagnosis not present

## 2015-06-26 DIAGNOSIS — I429 Cardiomyopathy, unspecified: Secondary | ICD-10-CM | POA: Diagnosis not present

## 2015-06-29 DIAGNOSIS — I429 Cardiomyopathy, unspecified: Secondary | ICD-10-CM | POA: Diagnosis not present

## 2015-06-29 DIAGNOSIS — I739 Peripheral vascular disease, unspecified: Secondary | ICD-10-CM | POA: Diagnosis not present

## 2015-06-29 DIAGNOSIS — R079 Chest pain, unspecified: Secondary | ICD-10-CM | POA: Diagnosis not present

## 2015-07-05 DIAGNOSIS — I739 Peripheral vascular disease, unspecified: Secondary | ICD-10-CM | POA: Diagnosis not present

## 2015-07-05 DIAGNOSIS — I509 Heart failure, unspecified: Secondary | ICD-10-CM | POA: Diagnosis not present

## 2015-07-05 DIAGNOSIS — R079 Chest pain, unspecified: Secondary | ICD-10-CM | POA: Diagnosis not present

## 2015-07-12 DIAGNOSIS — E119 Type 2 diabetes mellitus without complications: Secondary | ICD-10-CM | POA: Diagnosis not present

## 2015-07-12 DIAGNOSIS — I70219 Atherosclerosis of native arteries of extremities with intermittent claudication, unspecified extremity: Secondary | ICD-10-CM | POA: Diagnosis not present

## 2015-07-20 DIAGNOSIS — R0602 Shortness of breath: Secondary | ICD-10-CM | POA: Diagnosis not present

## 2015-07-20 DIAGNOSIS — E119 Type 2 diabetes mellitus without complications: Secondary | ICD-10-CM | POA: Diagnosis not present

## 2015-07-20 DIAGNOSIS — I70219 Atherosclerosis of native arteries of extremities with intermittent claudication, unspecified extremity: Secondary | ICD-10-CM | POA: Diagnosis not present

## 2015-07-24 DIAGNOSIS — H01003 Unspecified blepharitis right eye, unspecified eyelid: Secondary | ICD-10-CM | POA: Diagnosis not present

## 2015-08-01 DIAGNOSIS — Z936 Other artificial openings of urinary tract status: Secondary | ICD-10-CM | POA: Diagnosis not present

## 2015-08-01 DIAGNOSIS — Z436 Encounter for attention to other artificial openings of urinary tract: Secondary | ICD-10-CM | POA: Diagnosis not present

## 2015-08-02 DIAGNOSIS — I509 Heart failure, unspecified: Secondary | ICD-10-CM | POA: Diagnosis not present

## 2015-08-02 DIAGNOSIS — R0602 Shortness of breath: Secondary | ICD-10-CM | POA: Diagnosis not present

## 2015-08-02 DIAGNOSIS — R05 Cough: Secondary | ICD-10-CM | POA: Diagnosis not present

## 2015-08-02 DIAGNOSIS — I77810 Thoracic aortic ectasia: Secondary | ICD-10-CM | POA: Diagnosis not present

## 2015-08-02 DIAGNOSIS — C680 Malignant neoplasm of urethra: Secondary | ICD-10-CM | POA: Diagnosis not present

## 2015-08-07 DIAGNOSIS — I509 Heart failure, unspecified: Secondary | ICD-10-CM | POA: Diagnosis not present

## 2015-08-07 DIAGNOSIS — I70219 Atherosclerosis of native arteries of extremities with intermittent claudication, unspecified extremity: Secondary | ICD-10-CM | POA: Diagnosis not present

## 2015-08-07 DIAGNOSIS — I10 Essential (primary) hypertension: Secondary | ICD-10-CM | POA: Diagnosis not present

## 2015-08-07 DIAGNOSIS — E119 Type 2 diabetes mellitus without complications: Secondary | ICD-10-CM | POA: Diagnosis not present

## 2015-08-10 ENCOUNTER — Encounter: Payer: Self-pay | Admitting: Internal Medicine

## 2015-08-10 DIAGNOSIS — Z95810 Presence of automatic (implantable) cardiac defibrillator: Secondary | ICD-10-CM | POA: Diagnosis not present

## 2015-08-10 DIAGNOSIS — T451X5A Adverse effect of antineoplastic and immunosuppressive drugs, initial encounter: Secondary | ICD-10-CM | POA: Diagnosis not present

## 2015-08-10 DIAGNOSIS — I70219 Atherosclerosis of native arteries of extremities with intermittent claudication, unspecified extremity: Secondary | ICD-10-CM | POA: Diagnosis not present

## 2015-08-10 DIAGNOSIS — I509 Heart failure, unspecified: Secondary | ICD-10-CM | POA: Diagnosis not present

## 2015-08-10 DIAGNOSIS — G620 Drug-induced polyneuropathy: Secondary | ICD-10-CM | POA: Diagnosis not present

## 2015-08-15 ENCOUNTER — Encounter: Payer: Self-pay | Admitting: Internal Medicine

## 2015-08-15 ENCOUNTER — Ambulatory Visit (INDEPENDENT_AMBULATORY_CARE_PROVIDER_SITE_OTHER): Payer: Commercial Managed Care - HMO | Admitting: Internal Medicine

## 2015-08-15 VITALS — BP 138/82 | HR 74 | Ht 71.0 in | Wt 177.5 lb

## 2015-08-15 DIAGNOSIS — I5042 Chronic combined systolic (congestive) and diastolic (congestive) heart failure: Secondary | ICD-10-CM | POA: Diagnosis not present

## 2015-08-15 DIAGNOSIS — I159 Secondary hypertension, unspecified: Secondary | ICD-10-CM

## 2015-08-15 NOTE — Progress Notes (Signed)
ELECTROPHYSIOLOGY CONSULT NOTE  Patient ID: Bryce Clark, MRN: [id number], DOB/AGE: June 29, 1932 82 y.o. Admit date: (Not on file) Date of Consult: 08/15/2015  Primary Physician: Cletis Athens, MD Primary Cardiologist: [facility] Consulting Physician JM  Chief Complaint: ICD at ERI   HPI Bryce Clark is a 82 y.o. male  Referred as his CRT-D-Medtr[employer] has reached ERI.  It was implanted 2010; implant records were reviewed. Pre-implant consultation note was not available. Ejection fraction was 10-15% by echo. Catheterization had demonstrated nonischemic disease. She was on guideline directed medical therapy. He received a starfix   He denies exercise intolerance. He is able to mow his yard weed whack  his yard. He denies syncope orthopnea nocturnal dyspnea or peripheral edema. He has had no chest pain.  He has a history of DVT and PE. He dates this to 10 years ago although the medical records suggest only one year ago. He is no longer taking anticoagulation as he had some bleeding problems.  The specifics are not available   he has a history of bladder cancer so it might have been that he had bladder bleeding.   Past Medical History  Diagnosis Date  . Dysrhythmia   . Chronic combined systolic and diastolic CHF (congestive heart failure) ([place])   . Hypertension   . Presence of permanent cardiac pacemaker   . GERD (gastroesophageal reflux disease)   . Arthritis   . Diabetes mellitus without complication ([place])   . CAD (coronary artery disease)   . Gout   . Cardiomyopathy ([place])   . Chronic renal insufficiency   . History of permanent cardiac pacemaker placement   . COPD (chronic obstructive pulmonary disease) ([place])   . AICD (automatic cardioverter/defibrillator) present   . Myocardial infarction ([place])   . Prostate cancer ([place])   . Bladder cancer [facility])       Surgical History:  Past Surgical History  Procedure Laterality Date  . Cardiac catheterization    . Ileostomy    .  Bladder removal    . Appendectomy    . Insert / replace / remove pacemaker    . Total hip arthroplasty    . Revision urostomy cutaneous    . Cataract extraction w/phaco Left 09/12/2014    Procedure: CATARACT EXTRACTION PHACO AND INTRAOCULAR LENS PLACEMENT (IOC);  Surgeon: Estill Cotta, MD;  Location: [facility] ORS;  Service: Ophthalmology;  Laterality: Left;  [place]: 01:11.7 AP: 25.6 CDE: 29.48   . Peripheral vascular catheterization N/A 12/05/2014    Procedure: IVC Filter Insertion;  Surgeon: Algernon Huxley, MD;  Location: [place] [facility];  Service: Cardiovascular;  Laterality: N/A;  . Cataract extraction w/phaco Right 10/17/2014    Procedure: CATARACT EXTRACTION PHACO AND INTRAOCULAR LENS PLACEMENT (IOC);  Surgeon: Estill Cotta, MD;  Location: [facility];  Service: Ophthalmology;  Laterality: Right;  [place] 01:47 AP%61.8 CDE49.41 fluid pack lot # [id number]     Home Meds: Prior to Admission medications   Medication Sig Start Date End Date Taking? Authorizing Provider  insulin glargine (LANTUS) 100 UNIT/ML injection Inject 10 Units into the skin at bedtime as needed (for high blood sugar).   Yes Historical Provider, MD  metoprolol (LOPRESSOR) 50 MG tablet Take 50 mg by mouth 2 (two) times daily.   Yes Historical Provider, MD  saxagliptin HCl (ONGLYZA) 5 MG TABS tablet Take 5 mg by mouth daily.   Yes Historical Provider, MD  traZODone (DESYREL) 100 MG tablet Take 100 mg by mouth at bedtime.  Yes Historical Provider, MD    Allergies:  Allergies  Allergen Reactions  . Azithromycin Other (See Comments)    Reaction:  Cold sweats   . Codeine Nausea And Vomiting  . Oxycodone-Acetaminophen Nausea And Vomiting  . Xarelto [Rivaroxaban] Other (See Comments)    Reaction:  Bleeding     Social History   Social History  . Marital Status: Divorced    Spouse Name: N/A  . Number of Children: N/A  . Years of Education: N/A   Occupational History  . Not on file.   Social History Main Topics    . Smoking status: Former Smoker -- 0.25 packs/day for 1 years  . Smokeless tobacco: Not on file  . Alcohol Use: No  . Drug Use: No  . Sexual Activity: Not on file   Other Topics Concern  . Not on file   Social History Narrative     Family History  Problem Relation Age of Onset  . Stroke Father      ROS:  Please see the history of present illness.     All other systems reviewed and negative.    Physical Exam: Blood pressure 138/82, pulse 74, height 5\' 11"  (1.803 m), weight 177 lb 8 oz (80.513 kg). General: Well developed, well nourished male in no acute distress. Head: Normocephalic, atraumatic, sclera non-icteric, no xanthomas, nares are without discharge. EENT: normal  Lymph Nodes:  none Neck: Negative for carotid bruits. JVD not elevated. Back:without scoliosis kyphosis   Device pocket well healed; without hematoma or erythema.  There is no tethering  Lungs: Clear bilaterally to auscultation without wheezes, rales, or rhonchi. Breathing is unlabored. Heart: RRR with S1 S2. No  murmur . No rubs, or gallops appreciated. Abdomen: Soft, non-tender, non-distended with normoactive bowel sounds. No hepatomegaly. No rebound/guarding. No obvious abdominal masses. Msk:  Strength and tone appear normal for age. Extremities: No clubbing or cyanosis. No edema.  Distal pedal pulses are 2+ and equal bilaterally. Skin: Warm and Dry Neuro: Alert and oriented X 3. CN III-XII intact Grossly normal sensory and motor function . Psych:  Responds to questions appropriately with a normal affect.      Labs: Cardiac Enzymes No results for input(s): CKTOTAL, CKMB, TROPONINI in the last 72 hours. CBC Lab Results  Component Value Date   WBC 7.6 06/06/2015   HGB 14.3 06/06/2015   HCT 42.2 06/06/2015   MCV 94.0 06/06/2015   PLT 129* 06/06/2015   PROTIME: No results for input(s): LABPROT, INR in the last 72 hours. Chemistry No results for input(s): NA, K, CL, CO2, BUN, CREATININE, CALCIUM,  PROT, BILITOT, ALKPHOS, ALT, AST, GLUCOSE in the last 168 hours.  Invalid input(s): LABALBU Lipids No results found for: CHOL, HDL, LDLCALC, TRIG BNP No results found for: PROBNP Thyroid Function Tests: No results for input(s): TSH, T4TOTAL, T3FREE, THYROIDAB in the last 72 hours.  Invalid input(s): FREET3 Miscellaneous No results found for: DDIMER  Radiology/Studies:  No results found.  EKG: NSR with PVC P-synchronous/ AV  pacing With neg QRS V1 and upright I   Assessment and Plan:  NICM  CHF  LBBB  DVT/PE   IVC filter   The patient has a history of nonischemic cardiomyopathy and currently class II symptoms.  His device has reached [place]. We have discussed the risks and benefits of generator replacement including but not limited to infection. We have also discussed the replacement of both high-voltage low-voltage components. He would like to proceed with both.  We will  reassess his left ventricular function. He is currently on metoprolol tartrate which could be potentially switch to carvedilol; he is also on losartan. These are guideline recommended medications       Virl Axe

## 2015-08-15 NOTE — Patient Instructions (Addendum)
Medication Instructions: - Your physician recommends that you continue on your current medications as directed. Please refer to the Current Medication list given to you today.  Labwork: - none today  Procedures/Testing: - Your physician has requested that you have an echocardiogram. Echocardiography is a painless test that uses sound waves to create images of your heart. It provides your doctor with information about the size and shape of your heart and how well your heart's chambers and valves are working. This procedure takes approximately one hour. There are no restrictions for this procedure.   - Your physician has recommended that you have a pacemaker generator (battery) change. Please call Nira Conn, RN for Dr. Caryl Comes at [phone] when you are ready to schedule:  June dates= 7, 16, 19, 26, 28  Follow-Up: - pending procedure date  Any Additional Special Instructions Will Be Listed Below (If Applicable).     If you need a refill on your cardiac medications before your next appointment, please call your pharmacy.

## 2015-08-16 LAB — CUP PACEART INCLINIC DEVICE CHECK
Implantable Lead Implant Date: 20100414
Implantable Lead Model: 4195
Implantable Lead Model: 5076
MDC IDC LEAD IMPLANT DT: 20100414
MDC IDC LEAD IMPLANT DT: 20100414
MDC IDC LEAD LOCATION: 753858
MDC IDC LEAD LOCATION: 753859
MDC IDC LEAD LOCATION: 753860
MDC IDC SESS DTM: 20170510170746

## 2015-08-21 ENCOUNTER — Telehealth: Payer: Self-pay | Admitting: Internal Medicine

## 2015-08-21 DIAGNOSIS — I509 Heart failure, unspecified: Secondary | ICD-10-CM | POA: Diagnosis not present

## 2015-08-21 DIAGNOSIS — I70219 Atherosclerosis of native arteries of extremities with intermittent claudication, unspecified extremity: Secondary | ICD-10-CM | POA: Diagnosis not present

## 2015-08-21 DIAGNOSIS — R413 Other amnesia: Secondary | ICD-10-CM | POA: Diagnosis not present

## 2015-08-21 DIAGNOSIS — Z95810 Presence of automatic (implantable) cardiac defibrillator: Secondary | ICD-10-CM | POA: Diagnosis not present

## 2015-08-21 NOTE — Telephone Encounter (Signed)
Please call patient. He is ready to schedule an appt to have his pacemaker battery change. He is frustrated and couldn't get to you through the [facility] office.

## 2015-08-22 ENCOUNTER — Telehealth: Payer: Self-pay | Admitting: Internal Medicine

## 2015-08-22 DIAGNOSIS — I5042 Chronic combined systolic (congestive) and diastolic (congestive) heart failure: Secondary | ICD-10-CM

## 2015-08-22 DIAGNOSIS — Z01812 Encounter for preprocedural laboratory examination: Secondary | ICD-10-CM

## 2015-08-22 DIAGNOSIS — I428 Other cardiomyopathies: Secondary | ICD-10-CM

## 2015-08-22 NOTE — Telephone Encounter (Signed)
Pt girlfriend states pt is ready to schedule battery change out. Please call.

## 2015-08-23 ENCOUNTER — Encounter: Payer: Self-pay | Admitting: *Deleted

## 2015-08-23 NOTE — Telephone Encounter (Signed)
I attempted to call Melissa back at the [facility] office (Dr. Lavera Guise)- message states the office is now closed. I will try to call her back tomorrow.

## 2015-08-23 NOTE — Telephone Encounter (Signed)
I called and spoke with Otila Kluver (DPR). The patient would like to have his generator change done on 09/22/15. He will have pre-procedure labs on 09/15/15 at the [place] office. Letter of instructions mailed to the patient- mailing address confirmed.

## 2015-08-23 NOTE — Telephone Encounter (Signed)
Lenna Sciara is calling in to see what was done on the visit of 5/9 because the pt's defibrillator is no longer working and is needing to have it changed out for a new one. Please f/u and discuss with her.

## 2015-08-24 NOTE — Telephone Encounter (Signed)
I called and spoke with Lenna Sciara at [facility]. She states the patient had his echo done there in March and she will fax a copy of this to me.

## 2015-08-29 ENCOUNTER — Ambulatory Visit: Payer: Medicaid Other | Admitting: Internal Medicine

## 2015-08-31 ENCOUNTER — Other Ambulatory Visit: Payer: Medicaid Other

## 2015-09-01 DIAGNOSIS — H01003 Unspecified blepharitis right eye, unspecified eyelid: Secondary | ICD-10-CM | POA: Diagnosis not present

## 2015-09-11 ENCOUNTER — Ambulatory Visit: Admission: RE | Admit: 2015-09-11 | Payer: Commercial Managed Care - HMO | Source: Ambulatory Visit

## 2015-09-11 ENCOUNTER — Ambulatory Visit: Payer: Commercial Managed Care - HMO

## 2015-09-14 ENCOUNTER — Other Ambulatory Visit: Payer: Commercial Managed Care - HMO

## 2015-09-14 ENCOUNTER — Ambulatory Visit: Payer: Commercial Managed Care - HMO | Admitting: Oncology

## 2015-09-15 ENCOUNTER — Other Ambulatory Visit (INDEPENDENT_AMBULATORY_CARE_PROVIDER_SITE_OTHER): Payer: Commercial Managed Care - HMO

## 2015-09-15 DIAGNOSIS — I429 Cardiomyopathy, unspecified: Secondary | ICD-10-CM

## 2015-09-15 DIAGNOSIS — Z01812 Encounter for preprocedural laboratory examination: Secondary | ICD-10-CM

## 2015-09-15 DIAGNOSIS — I5042 Chronic combined systolic (congestive) and diastolic (congestive) heart failure: Secondary | ICD-10-CM | POA: Diagnosis not present

## 2015-09-15 DIAGNOSIS — I428 Other cardiomyopathies: Secondary | ICD-10-CM

## 2015-09-16 ENCOUNTER — Other Ambulatory Visit: Payer: Self-pay | Admitting: Internal Medicine

## 2015-09-16 LAB — PROTIME-INR
INR: 1 (ref 0.8–1.2)
PROTHROMBIN TIME: 10 s (ref 9.1–12.0)

## 2015-09-16 LAB — BASIC METABOLIC PANEL
BUN / CREAT RATIO: 18 (ref 10–24)
BUN: 44 mg/dL — ABNORMAL HIGH (ref 8–27)
CO2: 19 mmol/L (ref 18–29)
CREATININE: 2.44 mg/dL — AB (ref 0.76–1.27)
Calcium: 9.6 mg/dL (ref 8.6–10.2)
Chloride: 96 mmol/L (ref 96–106)
GFR, EST AFRICAN AMERICAN: 28 mL/min/{1.73_m2} — AB (ref >59)
GFR, EST NON AFRICAN AMERICAN: 24 mL/min/{1.73_m2} — AB (ref >59)
Glucose: 130 mg/dL — ABNORMAL HIGH (ref 65–99)
Potassium: 4.7 mmol/L (ref 3.5–5.2)
SODIUM: 136 mmol/L (ref 134–144)

## 2015-09-16 LAB — CBC WITH DIFFERENTIAL/PLATELET
BASOS: 1 %
Basophils Absolute: 0 10*3/uL (ref 0.0–0.2)
EOS (ABSOLUTE): 0.4 10*3/uL (ref 0.0–0.4)
EOS: 5 %
HEMATOCRIT: 41.3 % (ref 37.5–51.0)
Hemoglobin: 14.3 g/dL (ref 12.6–17.7)
IMMATURE GRANS (ABS): 0.1 10*3/uL (ref 0.0–0.1)
IMMATURE GRANULOCYTES: 1 %
Lymphocytes Absolute: 1.5 10*3/uL (ref 0.7–3.1)
Lymphs: 18 %
MCH: 31.2 pg (ref 26.6–33.0)
MCHC: 34.6 g/dL (ref 31.5–35.7)
MCV: 90 fL (ref 79–97)
MONOS ABS: 0.9 10*3/uL (ref 0.1–0.9)
Monocytes: 12 %
NEUTROS PCT: 63 %
Neutrophils Absolute: 5.2 10*3/uL (ref 1.4–7.0)
PLATELETS: 149 10*3/uL — AB (ref 150–379)
RBC: 4.58 x10E6/uL (ref 4.14–5.80)
RDW: 13.8 % (ref 12.3–15.4)
WBC: 8.1 10*3/uL (ref 3.4–10.8)

## 2015-09-21 ENCOUNTER — Other Ambulatory Visit: Payer: Self-pay

## 2015-09-21 ENCOUNTER — Emergency Department: Payer: Commercial Managed Care - HMO

## 2015-09-21 ENCOUNTER — Inpatient Hospital Stay
Admission: EM | Admit: 2015-09-21 | Discharge: 2015-09-23 | DRG: 444 | Disposition: A | Discharge status: Home / Self Care | Payer: Commercial Managed Care - HMO | Attending: Internal Medicine | Admitting: Internal Medicine

## 2015-09-21 ENCOUNTER — Encounter: Payer: Self-pay | Admitting: Emergency Medicine

## 2015-09-21 ENCOUNTER — Telehealth: Payer: Self-pay | Admitting: Internal Medicine

## 2015-09-21 DIAGNOSIS — M1990 Unspecified osteoarthritis, unspecified site: Secondary | ICD-10-CM | POA: Diagnosis present

## 2015-09-21 DIAGNOSIS — J449 Chronic obstructive pulmonary disease, unspecified: Secondary | ICD-10-CM | POA: Diagnosis present

## 2015-09-21 DIAGNOSIS — I82409 Acute embolism and thrombosis of unspecified deep veins of unspecified lower extremity: Secondary | ICD-10-CM | POA: Diagnosis not present

## 2015-09-21 DIAGNOSIS — Z794 Long term (current) use of insulin: Secondary | ICD-10-CM

## 2015-09-21 DIAGNOSIS — I252 Old myocardial infarction: Secondary | ICD-10-CM

## 2015-09-21 DIAGNOSIS — Z888 Allergy status to other drugs, medicaments and biological substances status: Secondary | ICD-10-CM

## 2015-09-21 DIAGNOSIS — E1122 Type 2 diabetes mellitus with diabetic chronic kidney disease: Secondary | ICD-10-CM | POA: Diagnosis present

## 2015-09-21 DIAGNOSIS — C679 Malignant neoplasm of bladder, unspecified: Secondary | ICD-10-CM | POA: Diagnosis not present

## 2015-09-21 DIAGNOSIS — Z885 Allergy status to narcotic agent status: Secondary | ICD-10-CM

## 2015-09-21 DIAGNOSIS — Z8546 Personal history of malignant neoplasm of prostate: Secondary | ICD-10-CM

## 2015-09-21 DIAGNOSIS — I5042 Chronic combined systolic (congestive) and diastolic (congestive) heart failure: Secondary | ICD-10-CM | POA: Diagnosis not present

## 2015-09-21 DIAGNOSIS — N200 Calculus of kidney: Secondary | ICD-10-CM | POA: Diagnosis not present

## 2015-09-21 DIAGNOSIS — M109 Gout, unspecified: Secondary | ICD-10-CM | POA: Diagnosis present

## 2015-09-21 DIAGNOSIS — Z881 Allergy status to other antibiotic agents status: Secondary | ICD-10-CM

## 2015-09-21 DIAGNOSIS — I82220 Acute embolism and thrombosis of inferior vena cava: Secondary | ICD-10-CM | POA: Diagnosis not present

## 2015-09-21 DIAGNOSIS — I130 Hypertensive heart and chronic kidney disease with heart failure and stage 1 through stage 4 chronic kidney disease, or unspecified chronic kidney disease: Secondary | ICD-10-CM | POA: Diagnosis not present

## 2015-09-21 DIAGNOSIS — Z823 Family history of stroke: Secondary | ICD-10-CM | POA: Diagnosis not present

## 2015-09-21 DIAGNOSIS — K8020 Calculus of gallbladder without cholecystitis without obstruction: Secondary | ICD-10-CM | POA: Diagnosis not present

## 2015-09-21 DIAGNOSIS — Z8551 Personal history of malignant neoplasm of bladder: Secondary | ICD-10-CM

## 2015-09-21 DIAGNOSIS — Z79899 Other long term (current) drug therapy: Secondary | ICD-10-CM | POA: Diagnosis not present

## 2015-09-21 DIAGNOSIS — K810 Acute cholecystitis: Secondary | ICD-10-CM | POA: Diagnosis not present

## 2015-09-21 DIAGNOSIS — R1010 Upper abdominal pain, unspecified: Secondary | ICD-10-CM

## 2015-09-21 DIAGNOSIS — N390 Urinary tract infection, site not specified: Secondary | ICD-10-CM | POA: Diagnosis present

## 2015-09-21 DIAGNOSIS — I2510 Atherosclerotic heart disease of native coronary artery without angina pectoris: Secondary | ICD-10-CM | POA: Diagnosis present

## 2015-09-21 DIAGNOSIS — N183 Chronic kidney disease, stage 3 (moderate): Secondary | ICD-10-CM | POA: Diagnosis present

## 2015-09-21 DIAGNOSIS — K219 Gastro-esophageal reflux disease without esophagitis: Secondary | ICD-10-CM | POA: Diagnosis present

## 2015-09-21 DIAGNOSIS — Z96649 Presence of unspecified artificial hip joint: Secondary | ICD-10-CM | POA: Diagnosis present

## 2015-09-21 DIAGNOSIS — I10 Essential (primary) hypertension: Secondary | ICD-10-CM | POA: Diagnosis not present

## 2015-09-21 DIAGNOSIS — I429 Cardiomyopathy, unspecified: Secondary | ICD-10-CM | POA: Diagnosis present

## 2015-09-21 DIAGNOSIS — Z95810 Presence of automatic (implantable) cardiac defibrillator: Secondary | ICD-10-CM | POA: Diagnosis not present

## 2015-09-21 DIAGNOSIS — Z87891 Personal history of nicotine dependence: Secondary | ICD-10-CM | POA: Diagnosis not present

## 2015-09-21 DIAGNOSIS — Z906 Acquired absence of other parts of urinary tract: Secondary | ICD-10-CM

## 2015-09-21 DIAGNOSIS — K8000 Calculus of gallbladder with acute cholecystitis without obstruction: Principal | ICD-10-CM | POA: Diagnosis present

## 2015-09-21 DIAGNOSIS — R109 Unspecified abdominal pain: Secondary | ICD-10-CM | POA: Diagnosis not present

## 2015-09-21 LAB — URINALYSIS COMPLETE WITH MICROSCOPIC (ARMC ONLY)
Bilirubin Urine: NEGATIVE
Glucose, UA: NEGATIVE mg/dL
Ketones, ur: NEGATIVE mg/dL
Nitrite: NEGATIVE
PH: 6 (ref 5.0–8.0)
PROTEIN: NEGATIVE mg/dL
SQUAMOUS EPITHELIAL / LPF: NONE SEEN
Specific Gravity, Urine: 1.008 (ref 1.005–1.030)

## 2015-09-21 LAB — CBC
HCT: 42.6 % (ref 40.0–52.0)
HEMOGLOBIN: 14.4 g/dL (ref 13.0–18.0)
MCH: 31.8 pg (ref 26.0–34.0)
MCHC: 33.9 g/dL (ref 32.0–36.0)
MCV: 93.8 fL (ref 80.0–100.0)
PLATELETS: 183 10*3/uL (ref 150–440)
RBC: 4.54 MIL/uL (ref 4.40–5.90)
RDW: 14 % (ref 11.5–14.5)
WBC: 12.1 10*3/uL — ABNORMAL HIGH (ref 3.8–10.6)

## 2015-09-21 LAB — COMPREHENSIVE METABOLIC PANEL
ALBUMIN: 4 g/dL (ref 3.5–5.0)
ALT: 16 U/L — ABNORMAL LOW (ref 17–63)
ANION GAP: 10 (ref 5–15)
AST: 22 U/L (ref 15–41)
Alkaline Phosphatase: 60 U/L (ref 38–126)
BUN: 21 mg/dL — ABNORMAL HIGH (ref 6–20)
CHLORIDE: 102 mmol/L (ref 101–111)
CO2: 23 mmol/L (ref 22–32)
CREATININE: 1.72 mg/dL — AB (ref 0.61–1.24)
Calcium: 9.8 mg/dL (ref 8.9–10.3)
GFR calc non Af Amer: 36 mL/min — ABNORMAL LOW (ref >60)
GFR, EST AFRICAN AMERICAN: 41 mL/min — AB (ref >60)
Glucose, Bld: 147 mg/dL — ABNORMAL HIGH (ref 65–99)
Potassium: 4.3 mmol/L (ref 3.5–5.1)
SODIUM: 135 mmol/L (ref 135–145)
Total Bilirubin: 0.9 mg/dL (ref 0.3–1.2)
Total Protein: 7.5 g/dL (ref 6.5–8.1)

## 2015-09-21 LAB — LIPASE, BLOOD: LIPASE: 22 U/L (ref 11–51)

## 2015-09-21 MED ORDER — HYDRALAZINE HCL 25 MG PO TABS
50.0000 mg | ORAL_TABLET | Freq: Two times a day (BID) | ORAL | Status: DC
  Administered 2015-09-22 – 2015-09-23 (×4): 50 mg via ORAL
  Filled 2015-09-21 (×4): qty 2

## 2015-09-21 MED ORDER — INSULIN GLARGINE 100 UNIT/ML ~~LOC~~ SOLN: 10.0000 [IU] | Freq: Every evening | SUBCUTANEOUS | Status: DC | PRN

## 2015-09-21 MED ORDER — DOCUSATE SODIUM 100 MG PO CAPS
100.0000 mg | ORAL_CAPSULE | Freq: Two times a day (BID) | ORAL | Status: DC
  Administered 2015-09-22: 100 mg via ORAL
  Filled 2015-09-21 (×3): qty 1

## 2015-09-21 MED ORDER — ACETAMINOPHEN 650 MG RE SUPP: 650.0000 mg | Freq: Four times a day (QID) | RECTAL | Status: DC | PRN

## 2015-09-21 MED ORDER — LORATADINE 10 MG PO TABS
10.0000 mg | ORAL_TABLET | Freq: Every day | ORAL | Status: DC
  Administered 2015-09-22 – 2015-09-23 (×3): 10 mg via ORAL
  Filled 2015-09-21 (×3): qty 1

## 2015-09-21 MED ORDER — RANOLAZINE ER 500 MG PO TB12
500.0000 mg | ORAL_TABLET | Freq: Two times a day (BID) | ORAL | Status: DC
  Administered 2015-09-22 – 2015-09-23 (×3): 500 mg via ORAL
  Filled 2015-09-21 (×5): qty 1

## 2015-09-21 MED ORDER — DIATRIZOATE MEGLUMINE & SODIUM 66-10 % PO SOLN
15.0000 mL | Freq: Once | ORAL | Status: AC
  Administered 2015-09-21: 15 mL via ORAL

## 2015-09-21 MED ORDER — HYDROMORPHONE HCL 1 MG/ML IJ SOLN
0.5000 mg | Freq: Once | INTRAMUSCULAR | Status: AC
  Administered 2015-09-21: 0.5 mg via INTRAVENOUS

## 2015-09-21 MED ORDER — ONDANSETRON HCL 4 MG/2ML IJ SOLN
INTRAMUSCULAR | Status: AC
  Administered 2015-09-21: 4 mg
  Filled 2015-09-21: qty 2

## 2015-09-21 MED ORDER — INSULIN ASPART 100 UNIT/ML ~~LOC~~ SOLN
0.0000 [IU] | Freq: Three times a day (TID) | SUBCUTANEOUS | Status: DC
  Administered 2015-09-23: 13:00:00 1 [IU] via SUBCUTANEOUS
  Filled 2015-09-21: qty 1

## 2015-09-21 MED ORDER — FLUTICASONE FUROATE-VILANTEROL 200-25 MCG/INH IN AEPB
1.0000 | INHALATION_SPRAY | Freq: Every day | RESPIRATORY_TRACT | Status: DC
  Administered 2015-09-22 – 2015-09-23 (×2): 1 via RESPIRATORY_TRACT
  Filled 2015-09-21: qty 28

## 2015-09-21 MED ORDER — MORPHINE SULFATE (PF) 4 MG/ML IV SOLN
4.0000 mg | Freq: Once | INTRAVENOUS | Status: AC
  Administered 2015-09-21: 4 mg via INTRAVENOUS

## 2015-09-21 MED ORDER — ONDANSETRON HCL 4 MG/2ML IJ SOLN: 4.0000 mg | Freq: Once | INTRAMUSCULAR | Status: DC

## 2015-09-21 MED ORDER — IOPAMIDOL (ISOVUE-300) INJECTION 61%
75.0000 mL | Freq: Once | INTRAVENOUS | Status: AC | PRN
  Administered 2015-09-21: 75 mL via INTRAVENOUS

## 2015-09-21 MED ORDER — HYDROMORPHONE HCL 1 MG/ML IJ SOLN
INTRAMUSCULAR | Status: AC
  Administered 2015-09-21: 0.5 mg via INTRAVENOUS
  Filled 2015-09-21: qty 1

## 2015-09-21 MED ORDER — MORPHINE SULFATE (PF) 4 MG/ML IV SOLN
INTRAVENOUS | Status: AC
  Administered 2015-09-21: 4 mg
  Filled 2015-09-21: qty 1

## 2015-09-21 MED ORDER — PIPERACILLIN-TAZOBACTAM 3.375 G IVPB 30 MIN
3.3750 g | Freq: Four times a day (QID) | INTRAVENOUS | Status: DC
  Administered 2015-09-21: 3.375 g via INTRAVENOUS
  Filled 2015-09-21 (×3): qty 50

## 2015-09-21 MED ORDER — ACETAMINOPHEN 325 MG PO TABS
650.0000 mg | ORAL_TABLET | Freq: Four times a day (QID) | ORAL | Status: DC | PRN
  Administered 2015-09-22: 650 mg via ORAL
  Filled 2015-09-21: qty 2

## 2015-09-21 MED ORDER — SODIUM CHLORIDE 0.9 % IV SOLN
1000.0000 mL | Freq: Once | INTRAVENOUS | Status: AC
  Administered 2015-09-21: 1000 mL via INTRAVENOUS

## 2015-09-21 MED ORDER — PANTOPRAZOLE SODIUM 40 MG PO TBEC
40.0000 mg | DELAYED_RELEASE_TABLET | Freq: Two times a day (BID) | ORAL | Status: DC
  Administered 2015-09-22 – 2015-09-23 (×4): 40 mg via ORAL
  Filled 2015-09-21 (×4): qty 1

## 2015-09-21 MED ORDER — SODIUM CHLORIDE 0.9 % IV SOLN
INTRAVENOUS | Status: DC
  Administered 2015-09-22: 02:00:00 via INTRAVENOUS

## 2015-09-21 MED ORDER — PIPERACILLIN-TAZOBACTAM 3.375 G IVPB 30 MIN
3.3750 g | Freq: Three times a day (TID) | INTRAVENOUS | Status: DC
  Administered 2015-09-22 – 2015-09-23 (×6): 3.375 g via INTRAVENOUS
  Filled 2015-09-21 (×7): qty 50

## 2015-09-21 MED ORDER — MORPHINE SULFATE (PF) 4 MG/ML IV SOLN
4.0000 mg | Freq: Once | INTRAVENOUS | Status: DC
  Filled 2015-09-21: qty 1

## 2015-09-21 MED ORDER — PIPERACILLIN-TAZOBACTAM 3.375 G IVPB 30 MIN
3.3750 g | Freq: Once | INTRAVENOUS | Status: AC
  Administered 2015-09-21: 3.375 g via INTRAVENOUS
  Filled 2015-09-21: qty 50

## 2015-09-21 MED ORDER — MORPHINE SULFATE (PF) 2 MG/ML IV SOLN
2.0000 mg | INTRAVENOUS | Status: DC | PRN
  Administered 2015-09-22 (×2): 2 mg via INTRAVENOUS
  Filled 2015-09-21 (×2): qty 1

## 2015-09-21 MED ORDER — METOPROLOL TARTRATE 50 MG PO TABS
50.0000 mg | ORAL_TABLET | Freq: Two times a day (BID) | ORAL | Status: DC
  Administered 2015-09-22 – 2015-09-23 (×3): 50 mg via ORAL
  Filled 2015-09-21 (×3): qty 1

## 2015-09-21 MED ORDER — HEPARIN (PORCINE) IN NACL 100-0.45 UNIT/ML-% IJ SOLN: 12.0000 [IU]/kg/h | INTRAMUSCULAR | Status: DC

## 2015-09-21 NOTE — Telephone Encounter (Signed)
Called EP lab to cancel CRT-P gen change for tomorrow. Will forward to Dr. Caryl Comes as an Juluis Rainier.

## 2015-09-21 NOTE — H&P (Signed)
[place] [facility] [place] NAME: Bryce Clark    MR#:  [id number]  DATE OF BIRTH:  1933/01/15  DATE OF ADMISSION:  09/21/2015  PRIMARY CARE PHYSICIAN: Cletis Athens, MD    CHIEF COMPLAINT:   Chief Complaint  Patient presents with  . Abdominal Pain    HISTORY OF PRESENT ILLNESS:  Bryce Clark  is a 82 y.o. male with a known history of Past medical history significant for bladder cancer status post cystectomy with an ileal conduit, prior history of a left lower extremity DVT who is had IVC filter placement because patient reportedly was on a blood thinner which she thinks was Eliquis about 10 years ago. Patient reportedly had a bleed into his ileostomy and so was discontinued. Patient also a past history of hypertension, insulin dependent diabetes and chronic kidney disease stage III. Patient said over the past for 5 days been complaining of some diffuse abdominal pain which she rated as 10 out of 10 in severity, constant in nature. Cities had decreased appetite but denied having nausea or vomiting. Denied any diarrhea. He felt warm but denied a true fever, has not had any chills or sweating. Because of abdominal pain came into the emergency room for evaluation.  On arrival he is been afebrile, his CBC showed a white blood cell count of 12.1, otherwise benign, his metabolic panel shows mildly elevated creatinine at 1.72 which appears to be stable and chronic. AST was normal at 22, ALT was 18. Patient underwent abdominal ultrasound which showed multiple gallstones with significant thickening of the gallbladder wall, subsequently underwent a CT of his abdomen pelvis which showed mild cholelithiasis with dilation of the gallbladder and surrounding inflammatory changes consistent with an acute cholecystitis. Patient also with an IVC filter in place and in large pedunculated thrombus arising superiorly from the filter extending into the suprarenal IVC.  Patient was given a dose of Zosyn and Dr. Burt Knack of general surgery was consult. He felt patient was a poor surgical candidate given his clot burden. Patient also has history of AICD placement and is supposed to undergo battery exchange tomorrow. Case discussed with Dr. Burt Knack he did not think the patient had a Percell Miller sign and so felt that he didn't require any surgery at this time. He is recommending the patient complete a course of antibiotics and hopefully with conservative measures symptoms will resolve. Said the patient may need an IR guided cholecystostomy tube but really didn't think that was necessary at this time either. On my exam it does seem like patient does have a Murphy sign said that he's been given some morphine and currently is feeling better. I tolerate clear liquids. Because of multiple medical problems and no plan for surgical intervention acutely hospitals called to admit.  PAST MEDICAL HISTORY:   Past Medical History  Diagnosis Date  . Dysrhythmia   . Chronic combined systolic and diastolic CHF (congestive heart failure) ([place])   . Hypertension   . Presence of permanent cardiac pacemaker   . GERD (gastroesophageal reflux disease)   . Arthritis   . Diabetes mellitus without complication ([place])   . CAD (coronary artery disease)   . Gout   . Cardiomyopathy ([place])   . Chronic renal insufficiency   . History of permanent cardiac pacemaker placement   . COPD (chronic obstructive pulmonary disease) ([place])   . AICD (automatic cardioverter/defibrillator) present   . Myocardial infarction ([place])   . Prostate cancer ([place])   .  Bladder cancer ([place])     PAST SURGICAL HISTORY:   Past Surgical History  Procedure Laterality Date  . Cardiac catheterization    . Ileostomy    . Bladder removal    . Appendectomy    . Insert / replace / remove pacemaker    . Total hip arthroplasty    . Revision urostomy cutaneous    . Cataract extraction w/phaco Left 09/12/2014    Procedure: CATARACT  EXTRACTION PHACO AND INTRAOCULAR LENS PLACEMENT (IOC);  Surgeon: Estill Cotta, MD;  Location: [facility] ORS;  Service: Ophthalmology;  Laterality: Left;  [place]: 01:11.7 AP: 25.6 CDE: 29.48   . Peripheral vascular catheterization N/A 12/05/2014    Procedure: IVC Filter Insertion;  Surgeon: Algernon Huxley, MD;  Location: [place] [facility];  Service: Cardiovascular;  Laterality: N/A;  . Cataract extraction w/phaco Right 10/17/2014    Procedure: CATARACT EXTRACTION PHACO AND INTRAOCULAR LENS PLACEMENT (IOC);  Surgeon: Estill Cotta, MD;  Location: [facility] ORS;  Service: Ophthalmology;  Laterality: Right;  [place] 01:47 AP%61.8 CDE49.41 fluid pack lot # [id number]    SOCIAL HISTORY:   Social History  Substance Use Topics  . Smoking status: Former Smoker -- 0.25 packs/day for 1 years  . Smokeless tobacco: Not on file  . Alcohol Use: No    FAMILY HISTORY:   Family History  Problem Relation Age of Onset  . Stroke Father     DRUG ALLERGIES:   Allergies  Allergen Reactions  . Azithromycin Other (See Comments)    Reaction:  Cold sweats   . Codeine Nausea And Vomiting  . Oxycodone-Acetaminophen Nausea And Vomiting  . Xarelto [Rivaroxaban] Other (See Comments)    Reaction:  Bleeding     REVIEW OF SYSTEMS:   ROS  MEDICATIONS AT HOME:   Prior to Admission medications   Medication Sig Start Date End Date Taking? Authorizing Provider  BREO ELLIPTA 200-25 MCG/INH AEPB Take 1 puff by mouth daily. 07/05/15  Yes Historical Provider, MD  insulin glargine (LANTUS) 100 UNIT/ML injection Inject 10 Units into the skin at bedtime as needed (for high blood sugar).   Yes Historical Provider, MD  loratadine (CLARITIN) 10 MG tablet Take 10 mg by mouth daily.   Yes Historical Provider, MD  metoprolol (LOPRESSOR) 50 MG tablet Take 50 mg by mouth 2 (two) times daily.   Yes Historical Provider, MD  pantoprazole (PROTONIX) 40 MG tablet Take 40 mg by mouth 2 (two) times daily. 09/04/15  Yes Historical Provider,  MD  RANEXA 500 MG 12 hr tablet Take 500 mg by mouth 2 (two) times daily. 09/03/15  Yes Historical Provider, MD  hydrALAZINE (APRESOLINE) 50 MG tablet Take 50 mg by mouth 2 (two) times daily. 06/09/15   Historical Provider, MD      VITAL SIGNS:  Blood pressure 138/79, pulse 77, temperature 98.1 F (36.7 C), temperature source Oral, resp. rate 19, height 5\' 11"  (1.803 m), weight 81.647 kg (180 lb), SpO2 95 %.  PHYSICAL EXAMINATION:  Physical Exam  GENERAL:  82 y.o.-year-old patient lying in the bed with no acute distress.  EYES: Pupils equal, round, reactive to light and accommodation. No scleral icterus. Extraocular muscles intact.  HEENT: Head atraumatic, normocephalic. Oropharynx and nasopharynx clear.  NECK:  Supple, no jugular venous distention. No thyroid enlargement, no tenderness.  LUNGS: Normal breath sounds bilaterally, no wheezing, rales,rhonchi or crepitation. No use of accessory muscles of respiration.  CARDIOVASCULAR: S1, S2 normal. No murmurs, rubs, or gallops.  ABDOMEN: Soft, nontender, nondistended. Bowel  sounds present. No organomegaly or mass.  EXTREMITIES: No pedal edema, cyanosis, or clubbing.  NEUROLOGIC: Cranial nerves II through XII are intact. Muscle strength 5/5 in all extremities. Sensation intact. Gait not checked.  PSYCHIATRIC: The patient is alert and oriented x 3.  SKIN: No obvious rash, lesion, or ulcer.   LABORATORY PANEL:   CBC  Recent Labs Lab 09/21/15 1123  WBC 12.1*  HGB 14.4  HCT 42.6  PLT 183   ------------------------------------------------------------------------------------------------------------------  Chemistries   Recent Labs Lab 09/21/15 1123  NA 135  K 4.3  CL 102  CO2 23  GLUCOSE 147*  BUN 21*  CREATININE 1.72*  CALCIUM 9.8  AST 22  ALT 16*  ALKPHOS 60  BILITOT 0.9   ------------------------------------------------------------------------------------------------------------------  Cardiac Enzymes No results for  input(s): TROPONINI in the last 168 hours. ------------------------------------------------------------------------------------------------------------------  RADIOLOGY:  Ct Abdomen Pelvis W Contrast  09/21/2015  CLINICAL DATA:  Acute lower abdominal pain. EXAM: CT ABDOMEN AND PELVIS WITH CONTRAST TECHNIQUE: Multidetector CT imaging of the abdomen and pelvis was performed using the standard protocol following bolus administration of intravenous contrast. CONTRAST:  43mL ISOVUE-300 IOPAMIDOL (ISOVUE-300) INJECTION 61% COMPARISON:  CT scan of March 01, 2015. FINDINGS: Severe degenerative disc disease is noted at L4-5. Visualized lung bases are unremarkable. Small gallstones are noted, with dilated gallbladder and surrounding inflammatory changes suggesting acute cholecystitis. Mild intrahepatic and extrahepatic biliary dilatation is noted of unknown etiology. No other focal abnormality is noted in the liver, spleen or pancreas. Adrenal glands appear normal. Nonobstructive calculus is seen in mid pole of left kidney. No hydronephrosis is noted. IVC filter is seen in infrarenal position with large pedunculated thrombus arising superiorly from it into the suprarenal IVC. Atherosclerosis of abdominal aorta is noted without aneurysm formation. Patient is status post cystectomy with ileal conduit and ileostomy seen in right lower quadrant. There is no evidence of bowel obstruction. No abnormal fluid collection is noted. No significant adenopathy is noted. IMPRESSION: Atherosclerosis of abdominal aorta without aneurysm formation. Status post cystectomy with ileal conduit seen in right lower quadrant of the abdomen. Nonobstructive left renal calculus is noted. No hydronephrosis or renal obstruction is noted. Mild cholelithiasis is noted with dilatation of gallbladder with surrounding inflammatory changes consistent with acute cholecystitis. Mild intrahepatic and extrahepatic biliary dilatation is noted of unknown  etiology. Correlation with liver function tests is recommended. IVC filter is seen in infrarenal position. Large pedunculated thrombus is seen arising superiorly from the filter extending into the suprarenal IVC. Critical Value/emergent results were called by telephone at the time of interpretation on 09/21/2015 at 1:41 pm to Dr. Lavonia Drafts , who verbally acknowledged these results. Electronically Signed   By: Marijo Conception, M.D.   On: 09/21/2015 13:45   US Abdomen Limited Ruq  09/21/2015  CLINICAL DATA:  Upper abdominal pain for 3 days. EXAM: US ABDOMEN LIMITED - RIGHT UPPER QUADRANT COMPARISON:  None. FINDINGS: Gallbladder: There are several gallstones within the dependent portion of the gallbladder. The largest gallstone measures 16 mm. The gallbladder wall demonstrates significant thickening measuring up to 7 mm. Sonographic Murphy's sign was not documented. Common bile duct: Diameter: 5.4 mm, normal. Liver: No focal lesion identified. Within normal limits in parenchymal echogenicity. Left lobe of the liver is partially obscured by bowel gas. Incidental note of right renal cortical thinning. IMPRESSION: Multiple gallstones with significant thickening of the gallbladder wall, which in the correct clinical setting would be consistent with acute cholecystitis. Incidental finding of right renal cortical thinning. These  results will be called to the ordering clinician or representative by the Radiologist Assistant, and communication documented in the PACS or zVision Dashboard. Electronically Signed   By: Fidela Salisbury M.D.   On: 09/21/2015 15:04      IMPRESSION AND PLAN:   17 male admitted for acute cholecystitis in the setting of IVC thrombus and need for AICD battery replacement.  1. Acute cholecystitis. Patient does appear to have a Murphy sign at this time, mid and benign earlier when he was given some morphine and was seen by Dr. Burt Knack. He agrees to follow the patient with [place]. Patient was  started on Zosyn in the ER, will continue IV Zosyn. Follow blood culture. Patient's symptoms don't resolve may need an IR guided cholecystostomy tube. I will keep him on a clear liquid diet at this time. When necessary narcotics for pain.  2. IVC thrombus. Patient had history of being on a blood thinner he thinks was Eliquis and developed a bleed into his ileal conduit about 10 years ago. Said he initially had anticoagulation for a left lower leg DVT. For now put the patient on a heparin drip and monitor his CBC closely. Again patient does have an IVC filter in place.  3. Bladder cancer. Had his procedure done at [facility], apparently discussed with patient about possible transfer but he was not interested in going per ER. Poorly had recent follow-up with his oncologist and was told he's been cancer free. Monitor his ileostomy for hematuria.  4. Hypertension. Continue hydralazine 50 mg twice a day and metoprolol 50 mg twice a day.  5. GERD. Continue Protonix 40 mg twice a day.  6. Insulin dependent diabetes mellitus. Patient said he rarely takes insulin, on a clear liquid diet, will put him on the insulin sliding scale 3 times a day.  7. AICD. Patient supposed to have a battery exchange. Will consult cardiology. Patient could not recall the name of his cardiologist but said that the offices on [address].  8. DVT prophylaxis. Heparin drip.    All the records are reviewed and case discussed with ED provider. Management plans discussed with the patient, family and they are in agreement.  CODE STATUS: Full code  TOTAL TIME TAKING CARE OF THIS PATIENT: 40 minutes.    Alesia Richards M.D on 09/21/2015 at 8:49 PM  Between 7am to 6pm - Pager - [phone]  After 6pm go to [email or web address] - Proofreader  [employer] [facility]  [facility]  Office  [phone]  CC: Primary care physician; Cletis Athens, MD   Note: This dictation was prepared with Dragon dictation along with smaller  phrase technology. Any transcriptional errors that result from this process are unintentional.

## 2015-09-21 NOTE — ED Notes (Signed)
C/O low back pain and lower abdominal pain.  Onset of symptoms yesterday morning.  STates pain woke patient up from sleep. Also c/o nausea, denies vomiting.

## 2015-09-21 NOTE — ED Notes (Signed)
To CT Scan

## 2015-09-21 NOTE — Telephone Encounter (Signed)
New message      Pt is scheduled for a pacemaker procedure tomorrow.  He is not at [facility] having his gall bladder removed. Please cancel procedure for tomorrow

## 2015-09-21 NOTE — Consult Note (Signed)
Surgical Consultation  09/21/2015  Hollie Bartus Sottile is an 82 y.o. male.   CC: Abdominal pain  HPI: This patient with a one-week history of abdominal pain he points to the epigastrium and right upper quadrant and also to his right flank. He's had some nausea but no emesis no fevers or chills and it has been steady but not worsening. He's never had an episode like this before denies jaundice or acholic stools. Of note he has had an ileal conduit for bladder cancer with resection and has an AICD in his left subclavian that was scheduled to have a battery change tomorrow as it is currently nonfunctional. Also of his considerable significance is the fact that he has an IVC filter with clots on and in the filter at risk for dislodgment. He had a DVT 1 year ago in his left leg. As not currently anticoagulated  Past Medical History  Diagnosis Date  . Dysrhythmia   . Chronic combined systolic and diastolic CHF (congestive heart failure) ([place])   . Hypertension   . Presence of permanent cardiac pacemaker   . GERD (gastroesophageal reflux disease)   . Arthritis   . Diabetes mellitus without complication ([place])   . CAD (coronary artery disease)   . Gout   . Cardiomyopathy ([place])   . Chronic renal insufficiency   . History of permanent cardiac pacemaker placement   . COPD (chronic obstructive pulmonary disease) ([place])   . AICD (automatic cardioverter/defibrillator) present   . Myocardial infarction ([place])   . Prostate cancer ([place])   . Bladder cancer [facility])     Past Surgical History  Procedure Laterality Date  . Cardiac catheterization    . Ileostomy    . Bladder removal    . Appendectomy    . Insert / replace / remove pacemaker    . Total hip arthroplasty    . Revision urostomy cutaneous    . Cataract extraction w/phaco Left 09/12/2014    Procedure: CATARACT EXTRACTION PHACO AND INTRAOCULAR LENS PLACEMENT (IOC);  Surgeon: Estill Cotta, MD;  Location: [facility] ORS;  Service: Ophthalmology;   Laterality: Left;  [place]: 01:11.7 AP: 25.6 CDE: 29.48   . Peripheral vascular catheterization N/A 12/05/2014    Procedure: IVC Filter Insertion;  Surgeon: Algernon Huxley, MD;  Location: [place] [facility];  Service: Cardiovascular;  Laterality: N/A;  . Cataract extraction w/phaco Right 10/17/2014    Procedure: CATARACT EXTRACTION PHACO AND INTRAOCULAR LENS PLACEMENT (IOC);  Surgeon: Estill Cotta, MD;  Location: [facility] ORS;  Service: Ophthalmology;  Laterality: Right;  [place] 01:47 AP%61.8 CDE49.41 fluid pack lot # [id number]    Family History  Problem Relation Age of Onset  . Stroke Father     Social History:  reports that he has quit smoking. He does not have any smokeless tobacco history on file. He reports that he does not drink alcohol or use illicit drugs.  Allergies:  Allergies  Allergen Reactions  . Azithromycin Other (See Comments)    Reaction:  Cold sweats   . Codeine Nausea And Vomiting  . Oxycodone-Acetaminophen Nausea And Vomiting  . Xarelto [Rivaroxaban] Other (See Comments)    Reaction:  Bleeding     Medications reviewed.   Review of Systems:   Review of Systems  Constitutional: Negative for fever, chills and weight loss.  HENT: Negative.   Eyes: Negative.   Respiratory: Negative for cough, hemoptysis, sputum production and shortness of breath.   Cardiovascular: Negative for chest pain, palpitations, orthopnea and claudication.  Nonfunctional AICD in place  Gastrointestinal: Positive for nausea and abdominal pain. Negative for heartburn, vomiting, diarrhea, constipation, blood in stool and melena.  Genitourinary:       Ileal conduit with no urinary symptoms  Musculoskeletal: Negative.   Skin: Negative.   Neurological: Negative.   Endo/Heme/Allergies: Negative.   Psychiatric/Behavioral: Negative.      Physical Exam:  BP 138/79 mmHg  Pulse 77  Temp(Src) 98.1 F (36.7 C) (Oral)  Resp 19  Ht 5' 11"  (1.803 m)  Wt 180 lb (81.647 kg)  BMI 25.12 kg/m2   SpO2 95%  Physical Exam  Constitutional: He is oriented to person, place, and time and well-developed, well-nourished, and in no distress. No distress.  Appears quite comfortable No icterus no jaundice  HENT:  Head: Normocephalic and atraumatic.  Eyes: Pupils are equal, round, and reactive to light. Right eye exhibits no discharge. Left eye exhibits no discharge. No scleral icterus.  Neck: Normal range of motion.  Cardiovascular: Normal rate, regular rhythm and normal heart sounds.   Left anterior chest AICD in place  Pulmonary/Chest: Effort normal and breath sounds normal. No respiratory distress. He has no wheezes. He has no rales. He exhibits no tenderness.  Abdominal: Soft. He exhibits no distension. There is no tenderness. There is no rebound and no guarding.  Minimal if any right upper quadrant tenderness with no Murphy sign ileal conduit present in the right mid abdomen and lower quadrant no hernia and nontender functional  Musculoskeletal: Normal range of motion. He exhibits no edema or tenderness.  Lymphadenopathy:    He has no cervical adenopathy.  Neurological: He is alert and oriented to person, place, and time.  Skin: Skin is warm and dry. No rash noted. He is not diaphoretic. No erythema.  Psychiatric: Mood and affect normal.  Vitals reviewed.     Results for orders placed or performed during the hospital encounter of 09/21/15 (from the past 48 hour(s))  Lipase, blood     Status: None   Collection Time: 09/21/15 11:23 AM  Result Value Ref Range   Lipase 22 11 - 51 U/L  Comprehensive metabolic panel     Status: Abnormal   Collection Time: 09/21/15 11:23 AM  Result Value Ref Range   Sodium 135 135 - 145 mmol/L   Potassium 4.3 3.5 - 5.1 mmol/L   Chloride 102 101 - 111 mmol/L   CO2 23 22 - 32 mmol/L   Glucose, Bld 147 (H) 65 - 99 mg/dL   BUN 21 (H) 6 - 20 mg/dL   Creatinine, Ser 1.72 (H) 0.61 - 1.24 mg/dL   Calcium 9.8 8.9 - 10.3 mg/dL   Total Protein 7.5 6.5 -  8.1 g/dL   Albumin 4.0 3.5 - 5.0 g/dL   AST 22 15 - 41 U/L   ALT 16 (L) 17 - 63 U/L   Alkaline Phosphatase 60 38 - 126 U/L   Total Bilirubin 0.9 0.3 - 1.2 mg/dL   GFR calc non Af Amer 36 (L) >60 mL/min   GFR calc Af Amer 41 (L) >60 mL/min    Comment: (NOTE) The eGFR has been calculated using the CKD EPI equation. This calculation has not been validated in all clinical situations. eGFR's persistently <60 mL/min signify possible Chronic Kidney Disease.    Anion gap 10 5 - 15  CBC     Status: Abnormal   Collection Time: 09/21/15 11:23 AM  Result Value Ref Range   WBC 12.1 (H) 3.8 - 10.6 K/uL  RBC 4.54 4.40 - 5.90 MIL/uL   Hemoglobin 14.4 13.0 - 18.0 g/dL   HCT 42.6 40.0 - 52.0 %   MCV 93.8 80.0 - 100.0 fL   MCH 31.8 26.0 - 34.0 pg   MCHC 33.9 32.0 - 36.0 g/dL   RDW 14.0 11.5 - 14.5 %   Platelets 183 150 - 440 K/uL  Urinalysis complete, with microscopic     Status: Abnormal   Collection Time: 09/21/15 11:23 AM  Result Value Ref Range   Color, Urine YELLOW (A) YELLOW   APPearance HAZY (A) CLEAR   Glucose, UA NEGATIVE NEGATIVE mg/dL   Bilirubin Urine NEGATIVE NEGATIVE   Ketones, ur NEGATIVE NEGATIVE mg/dL   Specific Gravity, Urine 1.008 1.005 - 1.030   Hgb urine dipstick 1+ (A) NEGATIVE   pH 6.0 5.0 - 8.0   Protein, ur NEGATIVE NEGATIVE mg/dL   Nitrite NEGATIVE NEGATIVE   Leukocytes, UA 3+ (A) NEGATIVE   RBC / HPF 6-30 0 - 5 RBC/hpf   WBC, UA TOO NUMEROUS TO COUNT 0 - 5 WBC/hpf   Bacteria, UA RARE (A) NONE SEEN   Squamous Epithelial / LPF NONE SEEN NONE SEEN   Mucous PRESENT    Ct Abdomen Pelvis W Contrast  09/21/2015  CLINICAL DATA:  Acute lower abdominal pain. EXAM: CT ABDOMEN AND PELVIS WITH CONTRAST TECHNIQUE: Multidetector CT imaging of the abdomen and pelvis was performed using the standard protocol following bolus administration of intravenous contrast. CONTRAST:  83m ISOVUE-300 IOPAMIDOL (ISOVUE-300) INJECTION 61% COMPARISON:  CT scan of March 01, 2015.  FINDINGS: Severe degenerative disc disease is noted at L4-5. Visualized lung bases are unremarkable. Small gallstones are noted, with dilated gallbladder and surrounding inflammatory changes suggesting acute cholecystitis. Mild intrahepatic and extrahepatic biliary dilatation is noted of unknown etiology. No other focal abnormality is noted in the liver, spleen or pancreas. Adrenal glands appear normal. Nonobstructive calculus is seen in mid pole of left kidney. No hydronephrosis is noted. IVC filter is seen in infrarenal position with large pedunculated thrombus arising superiorly from it into the suprarenal IVC. Atherosclerosis of abdominal aorta is noted without aneurysm formation. Patient is status post cystectomy with ileal conduit and ileostomy seen in right lower quadrant. There is no evidence of bowel obstruction. No abnormal fluid collection is noted. No significant adenopathy is noted. IMPRESSION: Atherosclerosis of abdominal aorta without aneurysm formation. Status post cystectomy with ileal conduit seen in right lower quadrant of the abdomen. Nonobstructive left renal calculus is noted. No hydronephrosis or renal obstruction is noted. Mild cholelithiasis is noted with dilatation of gallbladder with surrounding inflammatory changes consistent with acute cholecystitis. Mild intrahepatic and extrahepatic biliary dilatation is noted of unknown etiology. Correlation with liver function tests is recommended. IVC filter is seen in infrarenal position. Large pedunculated thrombus is seen arising superiorly from the filter extending into the suprarenal IVC. Critical Value/emergent results were called by telephone at the time of interpretation on 09/21/2015 at 1:41 pm to Dr. RLavonia Drafts, who verbally acknowledged these results. Electronically Signed   By: JMarijo Conception M.D.   On: 09/21/2015 13:45   [place]domen Limited Ruq  09/21/2015  CLINICAL DATA:  Upper abdominal pain for 3 days. EXAM: [place]DOM[place] LIMITED  - RIGHT UPPER QUADRANT COMPARISON:  None. FINDINGS: Gallbladder: There are several gallstones within the dependent portion of the gallbladder. The largest gallstone measures 16 mm. The gallbladder wall demonstrates significant thickening measuring up to 7 mm. Sonographic Murphy's sign was not documented. Common bile duct:  Diameter: 5.4 mm, normal. Liver: No focal lesion identified. Within normal limits in parenchymal echogenicity. Left lobe of the liver is partially obscured by bowel gas. Incidental note of right renal cortical thinning. IMPRESSION: Multiple gallstones with significant thickening of the gallbladder wall, which in the correct clinical setting would be consistent with acute cholecystitis. Incidental finding of right renal cortical thinning. These results will be called to the ordering clinician or representative by the Radiologist Assistant, and communication documented in the PACS or zVision Dashboard. Electronically Signed   By: Fidela Salisbury M.D.   On: 09/21/2015 15:04    Assessment/Plan:  Problem #1 acute cholecystitis. He has a negative Murphy sign and is not very tender in spite of the personally reviewed CT and ultrasound reports as well as the laboratory values. Under most circumstances we would recommend a cholecystectomy however he has other considerable conditions that preclude that and for this I would recommend IV antibiotics and if he worsens a cholecystostomy tube may be necessary but this point he is completely without Murphy's sign and would not recommend intervention. Problem #2 IVC filter with clot may require anticoagulation. Problem #3 nonfunctional AICD with plans for battery change tomorrow this should be on hold due to his risk of infection however this needs to be addressed as well.  Discussed with patient wife and emergency room physician that this patient needs to be admitted to the medicine service to address problems #23 and #1 from a nonsurgical aspect  should he worsen and surgery be indicated cholecystostomy tube may be a first choice percutaneously as opposed to an open cholecystectomy this was all described for him his wife they understood and agreed with this plan I will follow the patient while in the hospital.  Florene Glen, MD, FACS

## 2015-09-21 NOTE — ED Provider Notes (Signed)
[facility] Emergency Department Provider Note  ____________________________________________    I have reviewed the triage vital signs and the nursing notes.   HISTORY  Chief Complaint Abdominal Pain    HPI Bryce Clark is a 82 y.o. male who presents with abdominal pain. Patient reports the pain started yesterday early in the morning and woke him from sleep. He reports the pain is periumbilical and somewhat diffuse. He reports it is severe. Patient has a history of cystectomy status post bladder cancer. He denies fevers chills nausea vomiting. He does admit to passing flatus. He has never had this pain before     Past Medical History  Diagnosis Date  . Dysrhythmia   . Chronic combined systolic and diastolic CHF (congestive heart failure) ([place])   . Hypertension   . Presence of permanent cardiac pacemaker   . GERD (gastroesophageal reflux disease)   . Arthritis   . Diabetes mellitus without complication ([place])   . CAD (coronary artery disease)   . Gout   . Cardiomyopathy ([place])   . Chronic renal insufficiency   . History of permanent cardiac pacemaker placement   . COPD (chronic obstructive pulmonary disease) ([place])   . AICD (automatic cardioverter/defibrillator) present   . Myocardial infarction ([place])   . Prostate cancer ([place])   . Bladder cancer [facility])     Patient Active Problem List   Diagnosis Date Noted  . Dyspnea 01/11/2015  . UTI (urinary tract infection) 01/09/2015  . Elevated troponin 01/09/2015  . Chronic combined systolic and diastolic CHF (congestive heart failure) ([place]) 01/09/2015  . Type 2 diabetes mellitus ([place]) 01/09/2015  . CAD (coronary artery disease) 01/09/2015  . HTN (hypertension) 01/09/2015  . CKD (chronic kidney disease), stage III 01/09/2015  . Bladder cancer ([place]) 11/06/2014  . Automatic implantable cardioverter-defibrillator in situ 10/02/2012  . Diabetes mellitus ([place]) 10/02/2012  . Acid reflux 10/02/2012  .  Arthritis, degenerative 10/02/2012    Past Surgical History  Procedure Laterality Date  . Cardiac catheterization    . Ileostomy    . Bladder removal    . Appendectomy    . Insert / replace / remove pacemaker    . Total hip arthroplasty    . Revision urostomy cutaneous    . Cataract extraction w/phaco Left 09/12/2014    Procedure: CATARACT EXTRACTION PHACO AND INTRAOCULAR LENS PLACEMENT (IOC);  Surgeon: Estill Cotta, MD;  Location: [facility] ORS;  Service: Ophthalmology;  Laterality: Left;  [place]: 01:11.7 AP: 25.6 CDE: 29.48   . Peripheral vascular catheterization N/A 12/05/2014    Procedure: IVC Filter Insertion;  Surgeon: Algernon Huxley, MD;  Location: [place] CV LAB;  Service: Cardiovascular;  Laterality: N/A;  . Cataract extraction w/phaco Right 10/17/2014    Procedure: CATARACT EXTRACTION PHACO AND INTRAOCULAR LENS PLACEMENT (IOC);  Surgeon: Estill Cotta, MD;  Location: [facility] ORS;  Service: Ophthalmology;  Laterality: Right;  [place] 01:47 AP%61.8 CDE49.41 fluid pack lot # [id number]    Current Outpatient Rx  Name  Route  Sig  Dispense  Refill  . BREO ELLIPTA 200-25 MCG/INH AEPB   Oral   Take 1 puff by mouth daily.      1     Dispense as written.   . hydrALAZINE (APRESOLINE) 50 MG tablet   Oral   Take 50 mg by mouth 2 (two) times daily.      4   . insulin glargine (LANTUS) 100 UNIT/ML injection   Subcutaneous   Inject 10 Units into the skin  at bedtime as needed (for high blood sugar).         Marland Kitchen loratadine (CLARITIN) 10 MG tablet   Oral   Take 10 mg by mouth daily.         . metoprolol (LOPRESSOR) 50 MG tablet   Oral   Take 50 mg by mouth 2 (two) times daily.         . naproxen sodium (ANAPROX) 220 MG tablet   Oral   Take 220 mg by mouth 2 (two) times daily as needed (pain).         . pantoprazole (PROTONIX) 40 MG tablet   Oral   Take 40 mg by mouth 2 (two) times daily.      0   . RANEXA 500 MG 12 hr tablet   Oral   Take 500 mg by mouth 2 (two)  times daily.      7     Dispense as written.     Allergies Azithromycin; Codeine; Oxycodone-acetaminophen; and Xarelto  Family History  Problem Relation Age of Onset  . Stroke Father     Social History Social History  Substance Use Topics  . Smoking status: Former Smoker -- 0.25 packs/day for 1 years  . Smokeless tobacco: None  . Alcohol Use: No    Review of Systems  Constitutional: Negative for fever. Eyes: Negative for redness ENT: Negative for sore throat Cardiovascular: Negative for chest pain Respiratory: Negative for shortness of breath. Gastrointestinal: Diffuse of dental pain as above Genitourinary: Patient with urostomy Musculoskeletal: Mild discomfort in the back Skin: Negative for rash. Neurological: Negative for focal weakness Psychiatric: no anxiety    ____________________________________________   PHYSICAL EXAM:  VITAL SIGNS: ED Triage Vitals  Enc Vitals Group     BP 09/21/15 1119 166/88 mmHg     Pulse Rate 09/21/15 1119 101     Resp 09/21/15 1119 20     Temp 09/21/15 1119 98.1 F (36.7 C)     Temp Source 09/21/15 1119 Oral     SpO2 09/21/15 1119 96 %     Weight 09/21/15 1119 180 lb (81.647 kg)     Height 09/21/15 1119 5\' 11"  (1.803 m)     Head Cir --      Peak Flow --      Pain Score 09/21/15 1142 10     Pain Loc --      Pain Edu? --      Excl. in ? --     Constitutional: Alert and oriented. Well appearing and in no distress.  Eyes: Conjunctivae are normal. No erythema or injection ENT   Head: Normocephalic and atraumatic.   Mouth/Throat: Mucous membranes are moist. Cardiovascular: Normal rate, regular rhythm. Normal and symmetric distal pulses are present in the upper extremities. Patient has a pacemaker Respiratory: Normal respiratory effort without tachypnea nor retractions. Breath sounds are clear and equal bilaterally.  Gastrointestinal: Urostomy with yellow urine. Tenderness to palpation left lower quadrant right  upper quadrant and left upper quadrant. No distention. There is no CVA tenderness. Genitourinary: deferred Musculoskeletal: Nontender with normal range of motion in all extremities.  Neurologic:  Normal speech and language. No gross focal neurologic deficits are appreciated. Skin:  Skin is warm, dry and intact. No rash noted. Psychiatric: Mood and affect are normal. Patient exhibits appropriate insight and judgment.  ____________________________________________    LABS (pertinent positives/negatives)  Labs Reviewed  COMPREHENSIVE METABOLIC PANEL - Abnormal; Notable for the following:    Glucose, Bld 147 (*)  BUN 21 (*)    Creatinine, Ser 1.72 (*)    ALT 16 (*)    GFR calc non Af Amer 36 (*)    GFR calc Af Amer 41 (*)    All other components within normal limits  CBC - Abnormal; Notable for the following:    WBC 12.1 (*)    All other components within normal limits  URINALYSIS COMPLETEWITH MICROSCOPIC ([facility] ONLY) - Abnormal; Notable for the following:    Color, Urine YELLOW (*)    APPearance HAZY (*)    Hgb urine dipstick 1+ (*)    Leukocytes, UA 3+ (*)    Bacteria, UA RARE (*)    All other components within normal limits  LIPASE, BLOOD    ____________________________________________   EKG  ED ECG REPORT I, Lavonia Drafts, the attending physician, personally viewed and interpreted this ECG.  Date: 09/21/2015 EKG Time: 11:19 AM : Atrial sensed ventricular paced rhythm  QRS Axis: normal Intervals: normal ST/T Wave abnormalities: normal Conduction Disturbances: none Narrative Interpretation: unremarkable   ____________________________________________    RADIOLOGY  CT scan consistent with cholecystitis, also noted thrombus attached to the IVC filter  ____________________________________________   PROCEDURES  Procedure(s) performed: none  Critical Care performed: none  ____________________________________________   INITIAL IMPRESSION / ASSESSMENT  AND PLAN / ED COURSE  Pertinent labs & imaging results that were available during my care of the patient were reviewed by me and considered in my medical decision making (see chart for details).  Patient presents with Periumbilical abdominal pain. He has a history of a cystectomy with ileal conduit, he is not tachycardic and tender essentially diffusely. We will obtain CT head and pelvis, give morphine IV, Zofran IV, IV fluids. Reevaluate.  Called by radiologist and informed of CT consistent with acute cholecystitis. Also noted thrombus attached to IVC filter.  Called Dr. Adora Fridge of surgery, he is starting a case but will come see the patient afterwards. We will give IV Zosyn and additional pain medication as the patient continues to be uncomfortable. Vital signs are stable. I will ask my colleague Dr. Mariea Clonts to follow-up on surgery consultation.  ____________________________________________   FINAL CLINICAL IMPRESSION(S) / ED DIAGNOSES  Final diagnoses:  Upper abdominal pain  Acute cholecystitis        Lavonia Drafts, MD 09/21/15 1455

## 2015-09-21 NOTE — ED Notes (Signed)
To [place] via stretcher.  [id number].

## 2015-09-22 ENCOUNTER — Encounter (HOSPITAL_COMMUNITY): Admission: RE | Payer: Self-pay | Source: Ambulatory Visit

## 2015-09-22 ENCOUNTER — Ambulatory Visit (HOSPITAL_COMMUNITY)
Admission: RE | Admit: 2015-09-22 | Payer: Commercial Managed Care - HMO | Source: Ambulatory Visit | Admitting: Internal Medicine

## 2015-09-22 ENCOUNTER — Other Ambulatory Visit: Payer: Self-pay | Admitting: Internal Medicine

## 2015-09-22 DIAGNOSIS — Z79899 Other long term (current) drug therapy: Secondary | ICD-10-CM | POA: Diagnosis not present

## 2015-09-22 DIAGNOSIS — Z96649 Presence of unspecified artificial hip joint: Secondary | ICD-10-CM | POA: Diagnosis present

## 2015-09-22 DIAGNOSIS — Z87891 Personal history of nicotine dependence: Secondary | ICD-10-CM | POA: Diagnosis not present

## 2015-09-22 DIAGNOSIS — Z888 Allergy status to other drugs, medicaments and biological substances status: Secondary | ICD-10-CM | POA: Diagnosis not present

## 2015-09-22 DIAGNOSIS — J449 Chronic obstructive pulmonary disease, unspecified: Secondary | ICD-10-CM | POA: Diagnosis present

## 2015-09-22 DIAGNOSIS — I82220 Acute embolism and thrombosis of inferior vena cava: Secondary | ICD-10-CM | POA: Diagnosis present

## 2015-09-22 DIAGNOSIS — Z906 Acquired absence of other parts of urinary tract: Secondary | ICD-10-CM | POA: Diagnosis not present

## 2015-09-22 DIAGNOSIS — N183 Chronic kidney disease, stage 3 (moderate): Secondary | ICD-10-CM | POA: Diagnosis present

## 2015-09-22 DIAGNOSIS — M109 Gout, unspecified: Secondary | ICD-10-CM | POA: Diagnosis present

## 2015-09-22 DIAGNOSIS — I252 Old myocardial infarction: Secondary | ICD-10-CM | POA: Diagnosis not present

## 2015-09-22 DIAGNOSIS — K219 Gastro-esophageal reflux disease without esophagitis: Secondary | ICD-10-CM | POA: Diagnosis present

## 2015-09-22 DIAGNOSIS — Z8546 Personal history of malignant neoplasm of prostate: Secondary | ICD-10-CM | POA: Diagnosis not present

## 2015-09-22 DIAGNOSIS — Z794 Long term (current) use of insulin: Secondary | ICD-10-CM | POA: Diagnosis not present

## 2015-09-22 DIAGNOSIS — K8000 Calculus of gallbladder with acute cholecystitis without obstruction: Secondary | ICD-10-CM | POA: Diagnosis present

## 2015-09-22 DIAGNOSIS — Z95810 Presence of automatic (implantable) cardiac defibrillator: Secondary | ICD-10-CM | POA: Diagnosis not present

## 2015-09-22 DIAGNOSIS — I2510 Atherosclerotic heart disease of native coronary artery without angina pectoris: Secondary | ICD-10-CM | POA: Diagnosis present

## 2015-09-22 DIAGNOSIS — Z885 Allergy status to narcotic agent status: Secondary | ICD-10-CM | POA: Diagnosis not present

## 2015-09-22 DIAGNOSIS — Z823 Family history of stroke: Secondary | ICD-10-CM | POA: Diagnosis not present

## 2015-09-22 DIAGNOSIS — I130 Hypertensive heart and chronic kidney disease with heart failure and stage 1 through stage 4 chronic kidney disease, or unspecified chronic kidney disease: Secondary | ICD-10-CM | POA: Diagnosis present

## 2015-09-22 DIAGNOSIS — N390 Urinary tract infection, site not specified: Secondary | ICD-10-CM | POA: Diagnosis present

## 2015-09-22 DIAGNOSIS — M1990 Unspecified osteoarthritis, unspecified site: Secondary | ICD-10-CM | POA: Diagnosis present

## 2015-09-22 DIAGNOSIS — E1122 Type 2 diabetes mellitus with diabetic chronic kidney disease: Secondary | ICD-10-CM | POA: Diagnosis present

## 2015-09-22 DIAGNOSIS — Z8551 Personal history of malignant neoplasm of bladder: Secondary | ICD-10-CM | POA: Diagnosis not present

## 2015-09-22 DIAGNOSIS — I429 Cardiomyopathy, unspecified: Secondary | ICD-10-CM | POA: Diagnosis present

## 2015-09-22 DIAGNOSIS — Z881 Allergy status to other antibiotic agents status: Secondary | ICD-10-CM | POA: Diagnosis not present

## 2015-09-22 DIAGNOSIS — K810 Acute cholecystitis: Secondary | ICD-10-CM | POA: Diagnosis present

## 2015-09-22 DIAGNOSIS — I5042 Chronic combined systolic (congestive) and diastolic (congestive) heart failure: Secondary | ICD-10-CM | POA: Diagnosis present

## 2015-09-22 LAB — BASIC METABOLIC PANEL
Anion gap: 12 (ref 5–15)
BUN: 26 mg/dL — ABNORMAL HIGH (ref 6–20)
CALCIUM: 8.9 mg/dL (ref 8.9–10.3)
CO2: 25 mmol/L (ref 22–32)
CREATININE: 2.17 mg/dL — AB (ref 0.61–1.24)
Chloride: 98 mmol/L — ABNORMAL LOW (ref 101–111)
GFR calc non Af Amer: 27 mL/min — ABNORMAL LOW (ref >60)
GFR, EST AFRICAN AMERICAN: 31 mL/min — AB (ref >60)
Glucose, Bld: 97 mg/dL (ref 65–99)
Potassium: 4.1 mmol/L (ref 3.5–5.1)
SODIUM: 135 mmol/L (ref 135–145)

## 2015-09-22 LAB — HEPARIN LEVEL (UNFRACTIONATED): Heparin Unfractionated: 0.81 IU/mL — ABNORMAL HIGH (ref 0.30–0.70)

## 2015-09-22 LAB — CBC
HCT: 39.7 % — ABNORMAL LOW (ref 40.0–52.0)
Hemoglobin: 13.1 g/dL (ref 13.0–18.0)
MCH: 31 pg (ref 26.0–34.0)
MCHC: 32.9 g/dL (ref 32.0–36.0)
MCV: 94.3 fL (ref 80.0–100.0)
PLATELETS: 174 10*3/uL (ref 150–440)
RBC: 4.22 MIL/uL — ABNORMAL LOW (ref 4.40–5.90)
RDW: 14.2 % (ref 11.5–14.5)
WBC: 12.3 10*3/uL — ABNORMAL HIGH (ref 3.8–10.6)

## 2015-09-22 LAB — GLUCOSE, CAPILLARY
GLUCOSE-CAPILLARY: 92 mg/dL (ref 65–99)
GLUCOSE-CAPILLARY: 111 mg/dL — AB (ref 65–99)
GLUCOSE-CAPILLARY: 120 mg/dL — AB (ref 65–99)
GLUCOSE-CAPILLARY: 95 mg/dL (ref 65–99)

## 2015-09-22 LAB — HEMOGLOBIN A1C: HEMOGLOBIN A1C: 6.6 % — AB (ref 4.0–6.0)

## 2015-09-22 SURGERY — PPM/BIV PPM GENERATOR CHANGEOUT

## 2015-09-22 MED ORDER — ACETAMINOPHEN 325 MG PO TABS: 650.0000 mg | ORAL_TABLET | Freq: Four times a day (QID) | ORAL | Status: DC | PRN

## 2015-09-22 MED ORDER — HEPARIN (PORCINE) IN NACL 100-0.45 UNIT/ML-% IJ SOLN
1200.0000 [IU]/h | INTRAMUSCULAR | Status: DC
  Administered 2015-09-22: 1200 [IU]/h via INTRAVENOUS
  Administered 2015-09-22: 1400 [IU]/h via INTRAVENOUS
  Filled 2015-09-22 (×2): qty 250

## 2015-09-22 MED ORDER — ACETAMINOPHEN 650 MG RE SUPP: 650.0000 mg | Freq: Four times a day (QID) | RECTAL | Status: DC | PRN

## 2015-09-22 MED ORDER — HEPARIN BOLUS VIA INFUSION
5000.0000 [IU] | Freq: Once | INTRAVENOUS | Status: AC
  Administered 2015-09-22: 5000 [IU] via INTRAVENOUS
  Filled 2015-09-22: qty 5000

## 2015-09-22 NOTE — Progress Notes (Addendum)
[facility] NAME: Bryce Clark    MR#:  [id number]  DATE OF BIRTH:  Mar 22, 1933  SUBJECTIVE:  CHIEF COMPLAINT:   Chief Complaint  Patient presents with  . Abdominal Pain   No complaint. REVIEW OF SYSTEMS:  CONSTITUTIONAL: No fever, fatigue or weakness.  EYES: No blurred or double vision.  EARS, NOSE, AND THROAT: No tinnitus or ear pain.  RESPIRATORY: No cough, shortness of breath, wheezing or hemoptysis.  CARDIOVASCULAR: No chest pain, orthopnea, edema.  GASTROINTESTINAL: No nausea, vomiting, diarrhea or abdominal pain. No melena or bloody stool. GENITOURINARY: No dysuria, hematuria.  ENDOCRINE: No polyuria, nocturia,  HEMATOLOGY: No anemia, easy bruising or bleeding SKIN: No rash or lesion. MUSCULOSKELETAL: No joint pain or arthritis.   NEUROLOGIC: No tingling, numbness, weakness.  PSYCHIATRY: No anxiety or depression.   DRUG ALLERGIES:   Allergies  Allergen Reactions  . Azithromycin Other (See Comments)    Reaction:  Cold sweats   . Codeine Nausea And Vomiting  . Oxycodone-Acetaminophen Nausea And Vomiting  . Xarelto [Rivaroxaban] Other (See Comments)    Reaction:  Bleeding     VITALS:  Blood pressure 115/58, pulse 92, temperature 98.1 F (36.7 C), temperature source Oral, resp. rate 21, height 5\' 11"  (1.803 m), weight 180 lb (81.647 kg), SpO2 96 %.  PHYSICAL EXAMINATION:  GENERAL:  82 y.o.-year-old patient lying in the bed with no acute distress.  EYES: Pupils equal, round, reactive to light and accommodation. No scleral icterus. Extraocular muscles intact.  HEENT: Head atraumatic, normocephalic. Oropharynx and nasopharynx clear.  NECK:  Supple, no jugular venous distention. No thyroid enlargement, no tenderness.  LUNGS: Normal breath sounds bilaterally, no wheezing, rales,rhonchi or crepitation. No use of accessory muscles of respiration.  CARDIOVASCULAR: S1, S2 normal. No murmurs, rubs, or gallops.   ABDOMEN: Soft, nontender, nondistended. Bowel sounds present. No organomegaly or mass.  EXTREMITIES: No pedal edema, cyanosis, or clubbing.  NEUROLOGIC: Cranial nerves II through XII are intact. Muscle strength 5/5 in all extremities. Sensation intact. Gait not checked.  PSYCHIATRIC: The patient is alert and oriented x 3.  SKIN: No obvious rash, lesion, or ulcer.    LABORATORY PANEL:   CBC  Recent Labs Lab 09/22/15 1211  WBC 12.3*  HGB 13.1  HCT 39.7*  PLT 174   ------------------------------------------------------------------------------------------------------------------  Chemistries   Recent Labs Lab 09/21/15 1123  NA 135  K 4.3  CL 102  CO2 23  GLUCOSE 147*  BUN 21*  CREATININE 1.72*  CALCIUM 9.8  AST 22  ALT 16*  ALKPHOS 60  BILITOT 0.9   ------------------------------------------------------------------------------------------------------------------  Cardiac Enzymes No results for input(s): TROPONINI in the last 168 hours. ------------------------------------------------------------------------------------------------------------------  RADIOLOGY:  Ct Abdomen Pelvis W Contrast  09/21/2015  CLINICAL DATA:  Acute lower abdominal pain. EXAM: CT ABDOMEN AND PELVIS WITH CONTRAST TECHNIQUE: Multidetector CT imaging of the abdomen and pelvis was performed using the standard protocol following bolus administration of intravenous contrast. CONTRAST:  18mL ISOVUE-300 IOPAMIDOL (ISOVUE-300) INJECTION 61% COMPARISON:  CT scan of March 01, 2015. FINDINGS: Severe degenerative disc disease is noted at L4-5. Visualized lung bases are unremarkable. Small gallstones are noted, with dilated gallbladder and surrounding inflammatory changes suggesting acute cholecystitis. Mild intrahepatic and extrahepatic biliary dilatation is noted of unknown etiology. No other focal abnormality is noted in the liver, spleen or pancreas. Adrenal glands appear normal. Nonobstructive calculus  is seen in mid pole of left kidney. No hydronephrosis is noted. IVC filter is seen  in infrarenal position with large pedunculated thrombus arising superiorly from it into the suprarenal IVC. Atherosclerosis of abdominal aorta is noted without aneurysm formation. Patient is status post cystectomy with ileal conduit and ileostomy seen in right lower quadrant. There is no evidence of bowel obstruction. No abnormal fluid collection is noted. No significant adenopathy is noted. IMPRESSION: Atherosclerosis of abdominal aorta without aneurysm formation. Status post cystectomy with ileal conduit seen in right lower quadrant of the abdomen. Nonobstructive left renal calculus is noted. No hydronephrosis or renal obstruction is noted. Mild cholelithiasis is noted with dilatation of gallbladder with surrounding inflammatory changes consistent with acute cholecystitis. Mild intrahepatic and extrahepatic biliary dilatation is noted of unknown etiology. Correlation with liver function tests is recommended. IVC filter is seen in infrarenal position. Large pedunculated thrombus is seen arising superiorly from the filter extending into the suprarenal IVC. Critical Value/emergent results were called by telephone at the time of interpretation on 09/21/2015 at 1:41 pm to Dr. Lavonia Drafts , who verbally acknowledged these results. Electronically Signed   By: Marijo Conception, M.D.   On: 09/21/2015 13:45   US Abdomen Limited Ruq  09/21/2015  CLINICAL DATA:  Upper abdominal pain for 3 days. EXAM: US ABDOMEN LIMITED - RIGHT UPPER QUADRANT COMPARISON:  None. FINDINGS: Gallbladder: There are several gallstones within the dependent portion of the gallbladder. The largest gallstone measures 16 mm. The gallbladder wall demonstrates significant thickening measuring up to 7 mm. Sonographic Murphy's sign was not documented. Common bile duct: Diameter: 5.4 mm, normal. Liver: No focal lesion identified. Within normal limits in parenchymal  echogenicity. Left lobe of the liver is partially obscured by bowel gas. Incidental note of right renal cortical thinning. IMPRESSION: Multiple gallstones with significant thickening of the gallbladder wall, which in the correct clinical setting would be consistent with acute cholecystitis. Incidental finding of right renal cortical thinning. These results will be called to the ordering clinician or representative by the Radiologist Assistant, and communication documented in the PACS or zVision Dashboard. Electronically Signed   By: Fidela Salisbury M.D.   On: 09/21/2015 15:04    EKG:   Orders placed or performed during the hospital encounter of 09/21/15  . EKG 12-Lead  . EKG 12-Lead    ASSESSMENT AND PLAN:   31 male admitted for acute cholecystitis in the setting of IVC thrombus and need for AICD battery replacement.  1. Acute cholecystitis.  No indication for surgery at this time per Dr. Burt Knack.   continue IV Zosyn. Follow-up CBC and blood culture.  * UTI: per UA, continue zosyn, f/u U/C.  2. IVC thrombus. Patient had history of being on a blood thinner he thinks was Eliquis and developed a bleed into his ileal conduit about 10 years ago.  On heparin drip and follow-up CBC. May change to xarelto.  3. Bladder cancer. f/u his oncologist as outpatient. 4. Hypertension. Continue hydralazine 50 mg twice a day and metoprolol 50 mg twice a day.  5. GERD. Continue Protonix 40 mg twice a day.  6. Insulin dependent diabetes mellitus.  on the insulin sliding scale.  7. AICD. Patient supposed to have a battery exchange.  f/u cardiologist. Patient could not recall the name of his cardiologist but said that the offices on [address].   All the records are reviewed and case discussed with Care Management/Social Workerr. Management plans discussed with the patient, family and they are in agreement.  CODE STATUS: Full code  TOTAL TIME TAKING CARE OF THIS PATIENT: 101  minutes.  Greater  than 50% time was spent on coordination of care and face-to-face counseling.  POSSIBLE D/C IN 2 DAYS, DEPENDING ON CLINICAL CONDITION.   Demetrios Loll M.D on 09/22/2015 at 12:50 PM  Between 7am to 6pm - Pager - [phone]  After 6pm go to [email or web address] - password [email or web address] [facility]  Office  [phone]  CC: Primary care physician; Cletis Athens, MD

## 2015-09-22 NOTE — Progress Notes (Addendum)
ANTICOAGULATION CONSULT NOTE - Initial Consult  Pharmacy Consult for heparin drip Indication: DVT  Allergies  Allergen Reactions  . Azithromycin Other (See Comments)    Reaction:  Cold sweats   . Codeine Nausea And Vomiting  . Oxycodone-Acetaminophen Nausea And Vomiting  . Xarelto [Rivaroxaban] Other (See Comments)    Reaction:  Bleeding     Patient Measurements: Height: 5\' 11"  (180.3 cm) Weight: 180 lb (81.647 kg) IBW/kg (Calculated) : 75.3 Heparin Dosing Weight: 81.6kg  Vital Signs: Temp: 98.2 F (36.8 C) (06/16 0126) Temp Source: Oral (06/16 0126) BP: 160/66 mmHg (06/16 0126) Pulse Rate: 77 (06/16 0126)  Labs:  Recent Labs  09/21/15 1123  HGB 14.4  HCT 42.6  PLT 183  CREATININE 1.72*    Estimated Creatinine Clearance: 35.9 mL/min (by C-G formula based on Cr of 1.72).   Medical History: Past Medical History  Diagnosis Date  . Dysrhythmia   . Chronic combined systolic and diastolic CHF (congestive heart failure) ([place])   . Hypertension   . Presence of permanent cardiac pacemaker   . GERD (gastroesophageal reflux disease)   . Arthritis   . Diabetes mellitus without complication ([place])   . CAD (coronary artery disease)   . Gout   . Cardiomyopathy ([place])   . Chronic renal insufficiency   . History of permanent cardiac pacemaker placement   . COPD (chronic obstructive pulmonary disease) ([place])   . AICD (automatic cardioverter/defibrillator) present   . Myocardial infarction ([place])   . Prostate cancer ([place])   . Bladder cancer ([facility])     Medications:    Assessment: No anticoagulation in PTA medications.  Goal of Therapy:  Heparin level 0.3-0.7 units/ml Monitor platelets by anticoagulation protocol: Yes   Plan:  5000 unit bolus and initial rate or 1400 units/hr. First heparin level 8 hours after start of infusion.  Amare Bail S 09/22/2015,3:29 AM

## 2015-09-22 NOTE — Progress Notes (Signed)
CC: Cholecystitis Subjective: Denies any pain. No nausea or vomiting. Did  take some by mouth  Objective: Vital signs in last 24 hours: Temp:  [98.1 F (36.7 C)-98.2 F (36.8 C)] 98.1 F (36.7 C) (06/16 0500) Pulse Rate:  [74-92] 92 (06/16 0956) Resp:  [12-21] 21 (06/16 0030) BP: (115-160)/(58-79) 115/58 mmHg (06/16 0956) SpO2:  [90 %-98 %] 96 % (06/16 0500) Last BM Date: 09/21/15  Intake/Output from previous day:   Intake/Output this shift: Total I/O In: 50 [P.O.:960] Out: -   Physical exam: NAD alert Chest: CTA, AICD on left chest Abd: soft, NT, midline lower laparotomy scar with right ileoconduit right below his umbilicus, Morphis sign, no peritonitis Ext: well perfused, warm  Lab Results: CBC   Recent Labs  09/21/15 1123 09/22/15 1211  WBC 12.1* 12.3*  HGB 14.4 13.1  HCT 42.6 39.7*  PLT 183 174   BMET  Recent Labs  09/21/15 1123  NA 135  K 4.3  CL 102  CO2 23  GLUCOSE 147*  BUN 21*  CREATININE 1.72*  CALCIUM 9.8   PT/INR No results for input(s): LABPROT, INR in the last 72 hours. ABG No results for input(s): PHART, HCO3 in the last 72 hours.  Invalid input(s): PCO2, PO2  Studies/Results: Ct Abdomen Pelvis W Contrast  09/21/2015  CLINICAL DATA:  Acute lower abdominal pain. EXAM: CT ABDOMEN AND PELVIS WITH CONTRAST TECHNIQUE: Multidetector CT imaging of the abdomen and pelvis was performed using the standard protocol following bolus administration of intravenous contrast. CONTRAST:  1mL ISOVUE-300 IOPAMIDOL (ISOVUE-300) INJECTION 61% COMPARISON:  CT scan of March 01, 2015. FINDINGS: Severe degenerative disc disease is noted at L4-5. Visualized lung bases are unremarkable. Small gallstones are noted, with dilated gallbladder and surrounding inflammatory changes suggesting acute cholecystitis. Mild intrahepatic and extrahepatic biliary dilatation is noted of unknown etiology. No other focal abnormality is noted in the liver, spleen or pancreas.  Adrenal glands appear normal. Nonobstructive calculus is seen in mid pole of left kidney. No hydronephrosis is noted. IVC filter is seen in infrarenal position with large pedunculated thrombus arising superiorly from it into the suprarenal IVC. Atherosclerosis of abdominal aorta is noted without aneurysm formation. Patient is status post cystectomy with ileal conduit and ileostomy seen in right lower quadrant. There is no evidence of bowel obstruction. No abnormal fluid collection is noted. No significant adenopathy is noted. IMPRESSION: Atherosclerosis of abdominal aorta without aneurysm formation. Status post cystectomy with ileal conduit seen in right lower quadrant of the abdomen. Nonobstructive left renal calculus is noted. No hydronephrosis or renal obstruction is noted. Mild cholelithiasis is noted with dilatation of gallbladder with surrounding inflammatory changes consistent with acute cholecystitis. Mild intrahepatic and extrahepatic biliary dilatation is noted of unknown etiology. Correlation with liver function tests is recommended. IVC filter is seen in infrarenal position. Large pedunculated thrombus is seen arising superiorly from the filter extending into the suprarenal IVC. Critical Value/emergent results were called by telephone at the time of interpretation on 09/21/2015 at 1:41 pm to Dr. Lavonia Drafts , who verbally acknowledged these results. Electronically Signed   By: Marijo Conception, M.D.   On: 09/21/2015 13:45   US Abdomen Limited Ruq  09/21/2015  CLINICAL DATA:  Upper abdominal pain for 3 days. EXAM: US ABDOMEN LIMITED - RIGHT UPPER QUADRANT COMPARISON:  None. FINDINGS: Gallbladder: There are several gallstones within the dependent portion of the gallbladder. The largest gallstone measures 16 mm. The gallbladder wall demonstrates significant thickening measuring up to 7 mm. Sonographic  Murphy's sign was not documented. Common bile duct: Diameter: 5.4 mm, normal. Liver: No focal lesion  identified. Within normal limits in parenchymal echogenicity. Left lobe of the liver is partially obscured by bowel gas. Incidental note of right renal cortical thinning. IMPRESSION: Multiple gallstones with significant thickening of the gallbladder wall, which in the correct clinical setting would be consistent with acute cholecystitis. Incidental finding of right renal cortical thinning. These results will be called to the ordering clinician or representative by the Radiologist Assistant, and communication documented in the PACS or zVision Dashboard. Electronically Signed   By: Fidela Salisbury M.D.   On: 09/21/2015 15:04    Anti-infectives: Anti-infectives    Start     Dose/Rate Route Frequency Ordered Stop   09/21/15 2200  piperacillin-tazobactam (ZOSYN) IVPB 3.375 g     3.375 g 12.5 mL/hr over 240 Minutes Intravenous Every 8 hours 09/21/15 2046     09/21/15 1915  piperacillin-tazobactam (ZOSYN) IVPB 3.375 g  Status:  Discontinued     3.375 g 100 mL/hr over 30 Minutes Intravenous Every 6 hours 09/21/15 1913 09/22/15 0133   09/21/15 1400  piperacillin-tazobactam (ZOSYN) IVPB 3.375 g     3.375 g 100 mL/hr over 30 Minutes Intravenous  Once 09/21/15 1355 09/21/15 1454      Assessment/Plan: Acute cholecystitis on a very complex patient with a history of bladder cancer status post ileal conduit, IVC clot and a history of DVT now anticoagulated. Patient also needs battery exchange on his AICD.  Clinically with significant improvement over last 24 hours are responding appropriately to IV antibiotics. Recommend continuation of medical therapy with IV antibiotics. No need for percutaneous drainage. No surgical indication at this time. We'll continue to follow. D/w him the situation in  detail  and he understands  Caroleen Hamman, MD, [facility]  09/22/2015

## 2015-09-22 NOTE — Progress Notes (Signed)
Pt is refusing to have blood cultures, blood draws and fingersticks done.  Dr. Marcille Blanco made aware.

## 2015-09-22 NOTE — Progress Notes (Addendum)
ANTICOAGULATION CONSULT NOTE - Initial Consult  Pharmacy Consult for heparin drip Indication: DVT  Allergies  Allergen Reactions  . Azithromycin Other (See Comments)    Reaction:  Cold sweats   . Codeine Nausea And Vomiting  . Oxycodone-Acetaminophen Nausea And Vomiting  . Xarelto [Rivaroxaban] Other (See Comments)    Reaction:  Bleeding     Patient Measurements: Height: 5\' 11"  (180.3 cm) Weight: 180 lb (81.647 kg) IBW/kg (Calculated) : 75.3 Heparin Dosing Weight: 81.6kg  Vital Signs: Temp: 98.1 F (36.7 C) (06/16 0500) Temp Source: Oral (06/16 0500) BP: 115/58 mmHg (06/16 0956) Pulse Rate: 92 (06/16 0956)  Labs:  Recent Labs  09/21/15 1123 09/22/15 1211 09/22/15 1213  HGB 14.4 13.1  --   HCT 42.6 39.7*  --   PLT 183 174  --   HEPARINUNFRC  --   --  0.81*  CREATININE 1.72* 2.17*  --     Estimated Creatinine Clearance: 28.4 mL/min (by C-G formula based on Cr of 2.17).   Assessment: No anticoagulation in PTA medications.  Goal of Therapy:  Heparin level 0.3-0.7 units/ml Monitor platelets by anticoagulation protocol: Yes   Plan:  Heparin level supratherapeutic, will decrease rate to 1200 units/hr. Recheck heparin level in 8 hours. CBC with AM labs  6/16  Patient refused 21:30 lab draw. Will reorder level with AM labs.  Bare[place],Jody C 09/22/2015,1:29 PM

## 2015-09-22 NOTE — Care Management (Signed)
Patient admitted with acute cholecystitis and is not an optimal surgical candidate.  Receiving IV antibiotics.  Patient las a history of DVT and an IVC filter- that is laden  with clots.  He is currently on heparin drip but no chronic anticoagulation..  He has been on Eliquis and Xarelto but caused bleeding.  He says "I will stop taking anything that causes bleeding."  He says "I have had this clot for years..  Anticipate discharge within next 24 hours  if remains stable.  Unsure of plan for anticoagulation if patient declines Xarelto and Eliquis

## 2015-09-22 NOTE — Progress Notes (Signed)
Spoke with Dr. Marcille Blanco about the order for blood cultures.  They were originally ordered in the ED, but antibiotics were given before they were taken.  At this point, in case of surgery,  he suggested that they still be taken and noted that antibiotics were administered.

## 2015-09-23 LAB — GLUCOSE, CAPILLARY
Glucose-Capillary: 84 mg/dL (ref 65–99)
Glucose-Capillary: 132 mg/dL — ABNORMAL HIGH (ref 65–99)

## 2015-09-23 MED ORDER — AMOXICILLIN-POT CLAVULANATE 500-125 MG PO TABS: 1.0000 | ORAL_TABLET | Freq: Two times a day (BID) | ORAL | Status: DC

## 2015-09-23 MED ORDER — APIXABAN 5 MG PO TABS
10.0000 mg | ORAL_TABLET | Freq: Two times a day (BID) | ORAL | Status: DC
  Administered 2015-09-23: 11:00:00 10 mg via ORAL
  Filled 2015-09-23: qty 2

## 2015-09-23 MED ORDER — APIXABAN 5 MG PO TABS: 10.0000 mg | ORAL_TABLET | Freq: Two times a day (BID) | ORAL | Status: DC

## 2015-09-23 MED ORDER — APIXABAN 5 MG PO TABS: 5.0000 mg | ORAL_TABLET | Freq: Two times a day (BID) | ORAL | Status: DC

## 2015-09-23 NOTE — Care Management Note (Signed)
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 Verbalized that he would "give it a try if it does not cause excessive bleeding." Nurse at bedside.                 Action/Plan:   Expected Discharge Date:                  Expected Discharge Plan:     In-House Referral:     Discharge planning Services     Post Acute Care Choice:    Choice offered to:     DME Arranged:    DME Agency:     HH Arranged:    [place] Agency:     Status of Service:     Medicare Important Message Given:    Date Medicare IM Given:    Medicare IM give by:    Date Additional Medicare IM Given:    Additional Medicare Important Message give by:     If discussed at [place] of Stay Meetings, dates discussed:    Additional Comments:  Leronda Lewers A, RN 09/23/2015, 10:38 AM

## 2015-09-23 NOTE — Discharge Summary (Signed)
Bryce Clark at [place] NAME: Bryce Clark    MR#:  [id number]  DATE OF BIRTH:  09/27/1932  DATE OF ADMISSION:  09/21/2015 ADMITTING PHYSICIAN: Alesia Richards, MD  DATE OF DISCHARGE:09/23/2015 PRIMARY CARE PHYSICIAN: MASOUD,JAVED, MD    ADMISSION DIAGNOSIS:  Acute cholecystitis [K81.0] Upper abdominal pain [R10.10]   DISCHARGE DIAGNOSIS:  Acute cholecystitis.  UTI SECONDARY DIAGNOSIS:   Past Medical History  Diagnosis Date  . Dysrhythmia   . Chronic combined systolic and diastolic CHF (congestive heart failure) ([place])   . Hypertension   . Presence of permanent cardiac pacemaker   . GERD (gastroesophageal reflux disease)   . Arthritis   . Diabetes mellitus without complication ([place])   . CAD (coronary artery disease)   . Gout   . Cardiomyopathy ([place])   . Chronic renal insufficiency   . History of permanent cardiac pacemaker placement   . COPD (chronic obstructive pulmonary disease) ([place])   . AICD (automatic cardioverter/defibrillator) present   . Myocardial infarction ([place])   . Prostate cancer ([place])   . Bladder cancer [facility])     HOSPITAL COURSE:   54 male admitted for acute cholecystitis in the setting of IVC thrombus and need for AICD battery replacement.  1. Acute cholecystitis.  No indication for surgery at this time per Dr. Burt Knack.  He is treated with IV Zosyn. No symptoms. Pt refused lab. OK to discharge pt per Dr. Dahlia Byes. F/u surgeon as outpatient.  * UTI: per UA,  Treated with zosyn, change to augmentin.  2. IVC thrombus. Patient had history of being on a blood thinner he thinks was xarelto/Eliquis and developed a bleed into his ileal conduit about 10 years ago. But no eliquis was produced that time.  He has been treated with heparin drip. After discussion of benefit and side effect of eliquis, he agrees to try eliquis. Eliquis is given this am. Heparin drip was discontinue.   3. Bladder cancer. f/u his  oncologist as outpatient. 4. Hypertension. Continue hydralazine 50 mg twice a day and metoprolol 50 mg twice a day.  5. GERD. Continue Protonix 40 mg twice a day.  6. Insulin dependent diabetes mellitus.  on the insulin sliding scale.  7. AICD. Patient supposed to have a battery exchange.  f/u cardiologist, Dr. Caryl Comes as outpatient.  I discussed with Dr. Dahlia Byes.  DISCHARGE CONDITIONS:   Stable, discharge to home today.  CONSULTS OBTAINED:  Treatment Team:  Jules Husbands, MD Yolonda Kida, MD  DRUG ALLERGIES:   Allergies  Allergen Reactions  . Azithromycin Other (See Comments)    Reaction:  Cold sweats   . Codeine Nausea And Vomiting  . Oxycodone-Acetaminophen Nausea And Vomiting  . Xarelto [Rivaroxaban] Other (See Comments)    Reaction:  Bleeding     DISCHARGE MEDICATIONS:   Current Discharge Medication List    START taking these medications   Details  amoxicillin-clavulanate (AUGMENTIN) 500-125 MG tablet Take 1 tablet (500 mg total) by mouth every 12 (twelve) hours. Qty: 20 tablet, Refills: 0    !! apixaban (ELIQUIS) 5 MG TABS tablet Take 2 tablets (10 mg total) by mouth 2 (two) times daily. Qty: 27 tablet, Refills: 0    !! apixaban (ELIQUIS) 5 MG TABS tablet Take 1 tablet (5 mg total) by mouth 2 (two) times daily. Qty: 42 tablet, Refills: 0     !! - Potential duplicate medications found. Please discuss with provider.    CONTINUE these medications which have  NOT CHANGED   Details  BREO ELLIPTA 200-25 MCG/INH AEPB Take 1 puff by mouth daily. Refills: 1    insulin glargine (LANTUS) 100 UNIT/ML injection Inject 10 Units into the skin at bedtime as needed (for high blood sugar).    loratadine (CLARITIN) 10 MG tablet Take 10 mg by mouth daily.    metoprolol (LOPRESSOR) 50 MG tablet Take 50 mg by mouth 2 (two) times daily.    pantoprazole (PROTONIX) 40 MG tablet Take 40 mg by mouth 2 (two) times daily. Refills: 0    RANEXA 500 MG 12 hr tablet Take 500  mg by mouth 2 (two) times daily. Refills: 7    hydrALAZINE (APRESOLINE) 50 MG tablet Take 50 mg by mouth 2 (two) times daily. Refills: 4         DISCHARGE INSTRUCTIONS:    If you experience worsening of your admission symptoms, develop shortness of breath, life threatening emergency, suicidal or homicidal thoughts you must seek medical attention immediately by calling 911 or calling your MD immediately  if symptoms less severe.  You Must read complete instructions/literature along with all the possible adverse reactions/side effects for all the Medicines you take and that have been prescribed to you. Take any new Medicines after you have completely understood and accept all the possible adverse reactions/side effects.   Please note  You were cared for by a hospitalist during your hospital stay. If you have any questions about your discharge medications or the care you received while you were in the hospital after you are discharged, you can call the unit and asked to speak with the hospitalist on call if the hospitalist that took care of you is not available. Once you are discharged, your primary care physician will handle any further medical issues. Please note that NO REFILLS for any discharge medications will be authorized once you are discharged, as it is imperative that you return to your primary care physician (or establish a relationship with a primary care physician if you do not have one) for your aftercare needs so that they can reassess your need for medications and monitor your lab values.    Today   SUBJECTIVE   No complaint, he wants to go home today.   VITAL SIGNS:  Blood pressure 134/65, pulse 71, temperature 97.8 F (36.6 C), temperature source Oral, resp. rate 18, height 5\' 11"  (1.803 m), weight 180 lb (81.647 kg), SpO2 94 %.  I/O:   Intake/Output Summary (Last 24 hours) at 09/23/15 1247 Last data filed at 09/23/15 0800  Gross per 24 hour  Intake    290 ml   Output      0 ml  Net    290 ml    PHYSICAL EXAMINATION:  GENERAL:  82 y.o.-year-old patient lying in the bed with no acute distress.  EYES: Pupils equal, round, reactive to light and accommodation. No scleral icterus. Extraocular muscles intact.  HEENT: Head atraumatic, normocephalic. Oropharynx and nasopharynx clear.  NECK:  Supple, no jugular venous distention. No thyroid enlargement, no tenderness.  LUNGS: Normal breath sounds bilaterally, no wheezing, rales,rhonchi or crepitation. No use of accessory muscles of respiration.  CARDIOVASCULAR: S1, S2 normal. No murmurs, rubs, or gallops.  ABDOMEN: Soft, non-tender, non-distended. Bowel sounds present. No organomegaly or mass.  EXTREMITIES: No pedal edema, cyanosis, or clubbing.  NEUROLOGIC: Cranial nerves II through XII are intact. Muscle strength 5/5 in all extremities. Sensation intact. Gait not checked.  PSYCHIATRIC: The patient is alert and oriented x 3.  SKIN: No obvious rash, lesion, or ulcer.   DATA REVIEW:   CBC  Recent Labs Lab 09/22/15 1211  WBC 12.3*  HGB 13.1  HCT 39.7*  PLT 174    Chemistries   Recent Labs Lab 09/21/15 1123 09/22/15 1211  NA 135 135  K 4.3 4.1  CL 102 98*  CO2 23 25  GLUCOSE 147* 97  BUN 21* 26*  CREATININE 1.72* 2.17*  CALCIUM 9.8 8.9  AST 22  --   ALT 16*  --   ALKPHOS 60  --   BILITOT 0.9  --     Cardiac Enzymes No results for input(s): TROPONINI in the last 168 hours.  Microbiology Results  Results for orders placed or performed during the hospital encounter of 01/09/15  Blood culture (routine x 2)     Status: None   Collection Time: 01/09/15  7:28 PM  Result Value Ref Range Status   Specimen Description BLOOD LEFT ARM  Final   Special Requests BAA,10ML,ANA,AER  Final   Culture NO GROWTH 5 DAYS  Final   Report Status 01/14/2015 FINAL  Final  Urine culture     Status: None   Collection Time: 01/09/15  7:28 PM  Result Value Ref Range Status   Specimen Description  URINE, CATHETERIZED  Final   Special Requests Normal  Final   Culture   Final    >=100,000 COLONIES/mL KLEBSIELLA OXYTOCA >=100,000 COLONIES/mL PROVIDENCIA STUARTII    Report Status 01/12/2015 FINAL  Final   Organism ID, Bacteria KLEBSIELLA OXYTOCA  Final   Organism ID, Bacteria PROVIDENCIA STUARTII  Final      Susceptibility   Klebsiella oxytoca - MIC*    AMPICILLIN 16 RESISTANT Resistant     CEFTAZIDIME <=1 SENSITIVE Sensitive     CEFAZOLIN <=4 SENSITIVE Sensitive     CEFTRIAXONE <=1 SENSITIVE Sensitive     CIPROFLOXACIN <=0.25 SENSITIVE Sensitive     GENTAMICIN <=1 SENSITIVE Sensitive     IMIPENEM <=0.25 SENSITIVE Sensitive     TRIMETH/SULFA <=20 SENSITIVE Sensitive     * >=100,000 COLONIES/mL KLEBSIELLA OXYTOCA   Providencia stuartii - MIC*    AMPICILLIN 16 RESISTANT Resistant     CEFTAZIDIME <=1 SENSITIVE Sensitive     CEFAZOLIN >=64 RESISTANT Resistant     CEFTRIAXONE <=1 SENSITIVE Sensitive     CIPROFLOXACIN <=0.25 SENSITIVE Sensitive     GENTAMICIN <=1 RESISTANT Resistant     IMIPENEM 1 SENSITIVE Sensitive     TRIMETH/SULFA <=20 SENSITIVE Sensitive     * >=100,000 COLONIES/mL PROVIDENCIA STUARTII  Blood culture (routine x 2)     Status: None   Collection Time: 01/09/15  7:30 PM  Result Value Ref Range Status   Specimen Description BLOOD LEFT ARM  Final   Special Requests BAA,8ML,ANA,AER  Final   Culture NO GROWTH 5 DAYS  Final   Report Status 01/14/2015 FINAL  Final    RADIOLOGY:  Ct Abdomen Pelvis W Contrast  09/21/2015  CLINICAL DATA:  Acute lower abdominal pain. EXAM: CT ABDOMEN AND PELVIS WITH CONTRAST TECHNIQUE: Multidetector CT imaging of the abdomen and pelvis was performed using the standard protocol following bolus administration of intravenous contrast. CONTRAST:  12mL ISOVUE-300 IOPAMIDOL (ISOVUE-300) INJECTION 61% COMPARISON:  CT scan of March 01, 2015. FINDINGS: Severe degenerative disc disease is noted at L4-5. Visualized lung bases are unremarkable.  Small gallstones are noted, with dilated gallbladder and surrounding inflammatory changes suggesting acute cholecystitis. Mild intrahepatic and extrahepatic biliary dilatation is noted of unknown etiology. No  other focal abnormality is noted in the liver, spleen or pancreas. Adrenal glands appear normal. Nonobstructive calculus is seen in mid pole of left kidney. No hydronephrosis is noted. IVC filter is seen in infrarenal position with large pedunculated thrombus arising superiorly from it into the suprarenal IVC. Atherosclerosis of abdominal aorta is noted without aneurysm formation. Patient is status post cystectomy with ileal conduit and ileostomy seen in right lower quadrant. There is no evidence of bowel obstruction. No abnormal fluid collection is noted. No significant adenopathy is noted. IMPRESSION: Atherosclerosis of abdominal aorta without aneurysm formation. Status post cystectomy with ileal conduit seen in right lower quadrant of the abdomen. Nonobstructive left renal calculus is noted. No hydronephrosis or renal obstruction is noted. Mild cholelithiasis is noted with dilatation of gallbladder with surrounding inflammatory changes consistent with acute cholecystitis. Mild intrahepatic and extrahepatic biliary dilatation is noted of unknown etiology. Correlation with liver function tests is recommended. IVC filter is seen in infrarenal position. Large pedunculated thrombus is seen arising superiorly from the filter extending into the suprarenal IVC. Critical Value/emergent results were called by telephone at the time of interpretation on 09/21/2015 at 1:41 pm to Dr. Lavonia Drafts , who verbally acknowledged these results. Electronically Signed   By: Marijo Conception, M.D.   On: 09/21/2015 13:45   US Abdomen Limited Ruq  09/21/2015  CLINICAL DATA:  Upper abdominal pain for 3 days. EXAM: US ABDOMEN LIMITED - RIGHT UPPER QUADRANT COMPARISON:  None. FINDINGS: Gallbladder: There are several gallstones within  the dependent portion of the gallbladder. The largest gallstone measures 16 mm. The gallbladder wall demonstrates significant thickening measuring up to 7 mm. Sonographic Murphy's sign was not documented. Common bile duct: Diameter: 5.4 mm, normal. Liver: No focal lesion identified. Within normal limits in parenchymal echogenicity. Left lobe of the liver is partially obscured by bowel gas. Incidental note of right renal cortical thinning. IMPRESSION: Multiple gallstones with significant thickening of the gallbladder wall, which in the correct clinical setting would be consistent with acute cholecystitis. Incidental finding of right renal cortical thinning. These results will be called to the ordering clinician or representative by the Radiologist Assistant, and communication documented in the PACS or zVision Dashboard. Electronically Signed   By: Fidela Salisbury M.D.   On: 09/21/2015 15:04        Management plans discussed with the patient, family and they are in agreement.  CODE STATUS:     Code Status Orders        Start     Ordered   09/22/15 0005  Full code   Continuous     09/22/15 0004    Code Status History    Date Active Date Inactive Code Status Order ID Comments User Context   09/22/2015 12:02 AM 09/22/2015 12:04 AM Full Code [id number]  Alesia Richards, MD ED   09/21/2015 11:43 PM 09/22/2015 12:02 AM Full Code [id number]  Alesia Richards, MD ED   01/09/2015 11:58 PM 01/11/2015  4:07 PM Full Code [id number]  Lance Coon, MD Inpatient   12/05/2014  4:37 PM 12/06/2014 11:33 AM Full Code [id number]  Algernon Huxley, MD Inpatient      TOTAL TIME TAKING CARE OF THIS PATIENT: 35 minutes.    Demetrios Loll M.D on 09/23/2015 at 12:47 PM  Between 7am to 6pm - Pager - [phone]  After 6pm go to [email or web address] - password [email or web address] [facility]  Office  [phone]  CC: Primary care physician; Cletis Athens, MD

## 2015-09-23 NOTE — Progress Notes (Signed)
Pt refusing all lab work Midwife

## 2015-09-23 NOTE — Progress Notes (Signed)
ANTICOAGULATION CONSULT NOTE - Initial Consult  Pharmacy Consult for heparin drip Indication: DVT  Allergies  Allergen Reactions  . Azithromycin Other (See Comments)    Reaction:  Cold sweats   . Codeine Nausea And Vomiting  . Oxycodone-Acetaminophen Nausea And Vomiting  . Xarelto [Rivaroxaban] Other (See Comments)    Reaction:  Bleeding     Patient Measurements: Height: 5\' 11"  (180.3 cm) Weight: 180 lb (81.647 kg) IBW/kg (Calculated) : 75.3 Heparin Dosing Weight: 81.6kg  Vital Signs: Temp: 97.8 F (36.6 C) (06/17 0434) Temp Source: Oral (06/17 0434) BP: 144/64 mmHg (06/17 0434) Pulse Rate: 76 (06/17 0434)  Labs:  Recent Labs  09/21/15 1123 09/22/15 1211 09/22/15 1213  HGB 14.4 13.1  --   HCT 42.6 39.7*  --   PLT 183 174  --   HEPARINUNFRC  --   --  0.81*  CREATININE 1.72* 2.17*  --     Estimated Creatinine Clearance: 28.4 mL/min (by C-G formula based on Cr of 2.17).   Assessment: No anticoagulation in PTA medications.  Goal of Therapy:  Heparin level 0.3-0.7 units/ml Monitor platelets by anticoagulation protocol: Yes   Plan:  Heparin level supratherapeutic, will decrease rate to 1200 units/hr. Recheck heparin level in 8 hours. CBC with AM labs  6/16  Patient refused 21:30 lab draw. Will reorder level with AM labs.  6/17 Patient is refusing lab draws. Discussed with MD, not safe to continue heparin without monitoring. Per MD have asked RN to discuss with patient (which she has done repeatedly, patient still refusing lab draws). MD would like to continue heparin at the current rate, will visit patient this morning to discuss anticoagulation and need for lab draws.   Henok Heacock C 09/23/2015,8:21 AM

## 2015-09-23 NOTE — Consult Note (Signed)
ANTICOAGULATION CONSULT NOTE - Initial Consult  Pharmacy Consult for apixaban Indication: IVC thrombosis  Allergies  Allergen Reactions  . Azithromycin Other (See Comments)    Reaction:  Cold sweats   . Codeine Nausea And Vomiting  . Oxycodone-Acetaminophen Nausea And Vomiting  . Xarelto [Rivaroxaban] Other (See Comments)    Reaction:  Bleeding     Patient Measurements: Height: 5\' 11"  (180.3 cm) Weight: 180 lb (81.647 kg) IBW/kg (Calculated) : 75.3 Heparin Dosing Weight:   Vital Signs: Temp: 97.8 F (36.6 C) (06/17 0434) Temp Source: Oral (06/17 0434) BP: 139/87 mmHg (06/17 0829) Pulse Rate: 77 (06/17 0829)  Labs:  Recent Labs  09/21/15 1123 09/22/15 1211 09/22/15 1213  HGB 14.4 13.1  --   HCT 42.6 39.7*  --   PLT 183 174  --   HEPARINUNFRC  --   --  0.81*  CREATININE 1.72* 2.17*  --     Estimated Creatinine Clearance: 28.4 mL/min (by C-G formula based on Cr of 2.17).   Medical History: Past Medical History  Diagnosis Date  . Dysrhythmia   . Chronic combined systolic and diastolic CHF (congestive heart failure) ([place])   . Hypertension   . Presence of permanent cardiac pacemaker   . GERD (gastroesophageal reflux disease)   . Arthritis   . Diabetes mellitus without complication ([place])   . CAD (coronary artery disease)   . Gout   . Cardiomyopathy ([place])   . Chronic renal insufficiency   . History of permanent cardiac pacemaker placement   . COPD (chronic obstructive pulmonary disease) ([place])   . AICD (automatic cardioverter/defibrillator) present   . Myocardial infarction ([place])   . Prostate cancer ([place])   . Bladder cancer ([facility])     Medications:  Scheduled:  . apixaban  10 mg Oral BID   Followed by  . [START ON 09/30/2015] apixaban  5 mg Oral BID  . docusate sodium  100 mg Oral BID  . fluticasone furoate-vilanterol  1 puff Inhalation Daily  . hydrALAZINE  50 mg Oral BID  . insulin aspart  0-9 Units Subcutaneous TID WC  . loratadine  10 mg Oral Daily   . metoprolol  50 mg Oral BID  .  morphine injection  4 mg Intravenous Once  . ondansetron (ZOFRAN) IV  4 mg Intravenous Once  . pantoprazole  40 mg Oral BID  . piperacillin-tazobactam  3.375 g Intravenous Q8H  . ranolazine  500 mg Oral BID    Assessment: Pt is a 82 year old male with a thrombosis superiorly from IVC filter. Pt was on heparin drip, but refuses lab draws. Pharmacy consulted to dose apixaban.  Goal of Therapy:   Monitor platelets by anticoagulation protocol: Yes   Plan:  apixaban 10mg  BID x 7 days then 5mg  BID. Pharmacy to continue to monitor.  Ramond Dial, Pharm.D Clinical Pharmacist  09/23/2015,10:00 AM

## 2015-09-23 NOTE — Progress Notes (Signed)
Pt d/ced home.  Refusing lab draws.  We weren't able to determine his heparin levels so heparin drip stopped.  Pt started on Elequis.  Pt teaching on bleeding risks.  Reviewed f/u appts and new scripts.  Pt had no c/o pain and his diet was advanced w/no complaints.  IV's removed. Pt wheeled out and went home w/girlfriend.

## 2015-09-23 NOTE — Progress Notes (Signed)
CC: Acute cholecystitis Subjective: Doing well, no pain, tolerated regular diet, wants to go home  Objective: Vital signs in last 24 hours: Temp:  [97.8 F (36.6 C)-98.4 F (36.9 C)] 97.8 F (36.6 C) (06/17 0434) Pulse Rate:  [76-90] 77 (06/17 0829) Resp:  [16-18] 18 (06/16 2124) BP: (121-144)/(56-87) 139/87 mmHg (06/17 0829) SpO2:  [93 %-99 %] 95 % (06/17 0434) Last BM Date: 09/22/15 (per pt)  Intake/Output from previous day: 06/16 0701 - 06/17 0700 In: 1060 [P.O.:960; IV Piggyback:100] Out: 175 [Urine:175] Intake/Output this shift: Total I/O In: 240 [P.O.:240] Out: -   Physical exam: NAD alert Chest: CTA, defibrilator on left chest Abd: soft, NT, no peritonitis   Lab Results: CBC   Recent Labs  09/21/15 1123 09/22/15 1211  WBC 12.1* 12.3*  HGB 14.4 13.1  HCT 42.6 39.7*  PLT 183 174   BMET  Recent Labs  09/21/15 1123 09/22/15 1211  NA 135 135  K 4.3 4.1  CL 102 98*  CO2 23 25  GLUCOSE 147* 97  BUN 21* 26*  CREATININE 1.72* 2.17*  CALCIUM 9.8 8.9   PT/INR No results for input(s): LABPROT, INR in the last 72 hours. ABG No results for input(s): PHART, HCO3 in the last 72 hours.  Invalid input(s): PCO2, PO2  Studies/Results: Ct Abdomen Pelvis W Contrast  09/21/2015  CLINICAL DATA:  Acute lower abdominal pain. EXAM: CT ABDOMEN AND PELVIS WITH CONTRAST TECHNIQUE: Multidetector CT imaging of the abdomen and pelvis was performed using the standard protocol following bolus administration of intravenous contrast. CONTRAST:  76mL ISOVUE-300 IOPAMIDOL (ISOVUE-300) INJECTION 61% COMPARISON:  CT scan of March 01, 2015. FINDINGS: Severe degenerative disc disease is noted at L4-5. Visualized lung bases are unremarkable. Small gallstones are noted, with dilated gallbladder and surrounding inflammatory changes suggesting acute cholecystitis. Mild intrahepatic and extrahepatic biliary dilatation is noted of unknown etiology. No other focal abnormality is noted in  the liver, spleen or pancreas. Adrenal glands appear normal. Nonobstructive calculus is seen in mid pole of left kidney. No hydronephrosis is noted. IVC filter is seen in infrarenal position with large pedunculated thrombus arising superiorly from it into the suprarenal IVC. Atherosclerosis of abdominal aorta is noted without aneurysm formation. Patient is status post cystectomy with ileal conduit and ileostomy seen in right lower quadrant. There is no evidence of bowel obstruction. No abnormal fluid collection is noted. No significant adenopathy is noted. IMPRESSION: Atherosclerosis of abdominal aorta without aneurysm formation. Status post cystectomy with ileal conduit seen in right lower quadrant of the abdomen. Nonobstructive left renal calculus is noted. No hydronephrosis or renal obstruction is noted. Mild cholelithiasis is noted with dilatation of gallbladder with surrounding inflammatory changes consistent with acute cholecystitis. Mild intrahepatic and extrahepatic biliary dilatation is noted of unknown etiology. Correlation with liver function tests is recommended. IVC filter is seen in infrarenal position. Large pedunculated thrombus is seen arising superiorly from the filter extending into the suprarenal IVC. Critical Value/emergent results were called by telephone at the time of interpretation on 09/21/2015 at 1:41 pm to Dr. Lavonia Drafts , who verbally acknowledged these results. Electronically Signed   By: Marijo Conception, M.D.   On: 09/21/2015 13:45   US Abdomen Limited Ruq  09/21/2015  CLINICAL DATA:  Upper abdominal pain for 3 days. EXAM: US ABDOMEN LIMITED - RIGHT UPPER QUADRANT COMPARISON:  None. FINDINGS: Gallbladder: There are several gallstones within the dependent portion of the gallbladder. The largest gallstone measures 16 mm. The gallbladder wall demonstrates significant thickening  measuring up to 7 mm. Sonographic Murphy's sign was not documented. Common bile duct: Diameter: 5.4 mm,  normal. Liver: No focal lesion identified. Within normal limits in parenchymal echogenicity. Left lobe of the liver is partially obscured by bowel gas. Incidental note of right renal cortical thinning. IMPRESSION: Multiple gallstones with significant thickening of the gallbladder wall, which in the correct clinical setting would be consistent with acute cholecystitis. Incidental finding of right renal cortical thinning. These results will be called to the ordering clinician or representative by the Radiologist Assistant, and communication documented in the PACS or zVision Dashboard. Electronically Signed   By: Fidela Salisbury M.D.   On: 09/21/2015 15:04    Anti-infectives: Anti-infectives    Start     Dose/Rate Route Frequency Ordered Stop   09/23/15 0000  amoxicillin-clavulanate (AUGMENTIN) 500-125 MG tablet     1 tablet Oral Every 12 hours 09/23/15 1012     09/21/15 2200  piperacillin-tazobactam (ZOSYN) IVPB 3.375 g     3.375 g 12.5 mL/hr over 240 Minutes Intravenous Every 8 hours 09/21/15 2046     09/21/15 1915  piperacillin-tazobactam (ZOSYN) IVPB 3.375 g  Status:  Discontinued     3.375 g 100 mL/hr over 30 Minutes Intravenous Every 6 hours 09/21/15 1913 09/22/15 0133   09/21/15 1400  piperacillin-tazobactam (ZOSYN) IVPB 3.375 g     3.375 g 100 mL/hr over 30 Minutes Intravenous  Once 09/21/15 1355 09/21/15 1454      Assessment/Plan:  Acute cholecystitis on a very complex patient with a history of bladder cancer status post ileal conduit, IVC clot and a history of DVT now anticoagulated. Patient also needs battery exchange on his AICD.  Resolved cholecystitis , recommend 1 week course antibiotics as outpt.  No need for percutaneous drainage. No surgical indication at this time. We'll follow as outpt D/w him the situation in detail and he understands     Caroleen Hamman, MD, [facility]  09/23/2015

## 2015-09-23 NOTE — Discharge Instructions (Signed)
Heart healthy and ADA diet. Activity as tolerated. Watch for bleeding while on eliquis. F/u cardiologist for AICD. F/u Surgeon for cholecystitis.  Information on my medicine - ELIQUIS (apixaban)  This medication education was reviewed with me or my healthcare representative as part of my discharge preparation.  The pharmacist that spoke with me during my hospital stay was:  Cheri Guppy, [facility]  Why was Eliquis prescribed for you? Eliquis was prescribed to treat blood clots that may have been found in the veins of your legs (deep vein thrombosis) or in your lungs (pulmonary embolism) and to reduce the risk of them occurring again.  What do You need to know about Eliquis ? The starting dose is 10 mg (two 5 mg tablets) taken TWICE daily for the FIRST SEVEN (7) DAYS, then on (enter date) June 25th  the dose is reduced to ONE 5 mg tablet taken TWICE daily.  Eliquis may be taken with or without food.   Try to take the dose about the same time in the morning and in the evening. If you have difficulty swallowing the tablet whole please discuss with your pharmacist how to take the medication safely.  Take Eliquis exactly as prescribed and DO NOT stop taking Eliquis without talking to the doctor who prescribed the medication.  Stopping may increase your risk of developing a new blood clot.  Refill your prescription before you run out.  After discharge, you should have regular check-up appointments with your healthcare provider that is prescribing your Eliquis.    What do you do if you miss a dose? If a dose of ELIQUIS is not taken at the scheduled time, take it as soon as possible on the same day and twice-daily administration should be resumed. The dose should not be doubled to make up for a missed dose.  Important Safety Information A possible side effect of Eliquis is bleeding. You should call your healthcare provider right away if you experience any of the following: ? Bleeding from  an injury or your nose that does not stop. ? Unusual colored urine (red or dark brown) or unusual colored stools (red or black). ? Unusual bruising for unknown reasons. ? A serious fall or if you hit your head (even if there is no bleeding).  Some medicines may interact with Eliquis and might increase your risk of bleeding or clotting while on Eliquis. To help avoid this, consult your healthcare provider or pharmacist prior to using any new prescription or non-prescription medications, including herbals, vitamins, non-steroidal anti-inflammatory drugs (NSAIDs) and supplements.  This website has more information on Eliquis (apixaban): http://www.eliquis.com/eliquis/home

## 2015-09-24 LAB — URINE CULTURE: SPECIAL REQUESTS: NORMAL

## 2015-09-26 DIAGNOSIS — G620 Drug-induced polyneuropathy: Secondary | ICD-10-CM | POA: Diagnosis not present

## 2015-09-26 DIAGNOSIS — E785 Hyperlipidemia, unspecified: Secondary | ICD-10-CM | POA: Diagnosis not present

## 2015-09-26 DIAGNOSIS — C675 Malignant neoplasm of bladder neck: Secondary | ICD-10-CM | POA: Diagnosis not present

## 2015-09-27 ENCOUNTER — Telehealth: Payer: Self-pay | Admitting: Internal Medicine

## 2015-09-27 NOTE — Telephone Encounter (Signed)
Will forward to [place]- she was going to try to schedule f/u per Dr. Caryl Comes.

## 2015-09-27 NOTE — Telephone Encounter (Signed)
Patient girlfriend says she missed call from office .  No note from today in epic.  Patient was in hospital and unable to have generator change .  Bryce Clark wants to see if we needed to speak wit her .  Please call Bryce Clark .

## 2015-09-29 LAB — CULTURE, BLOOD (ROUTINE X 2): CULTURE: NO GROWTH

## 2015-10-06 ENCOUNTER — Ambulatory Visit (INDEPENDENT_AMBULATORY_CARE_PROVIDER_SITE_OTHER): Payer: Commercial Managed Care - HMO | Admitting: Internal Medicine

## 2015-10-06 VITALS — BP 160/80 | HR 76 | Ht 71.0 in | Wt 172.5 lb

## 2015-10-06 DIAGNOSIS — Z950 Presence of cardiac pacemaker: Secondary | ICD-10-CM | POA: Diagnosis not present

## 2015-10-06 DIAGNOSIS — I5042 Chronic combined systolic (congestive) and diastolic (congestive) heart failure: Secondary | ICD-10-CM

## 2015-10-06 DIAGNOSIS — I429 Cardiomyopathy, unspecified: Secondary | ICD-10-CM

## 2015-10-06 DIAGNOSIS — I428 Other cardiomyopathies: Secondary | ICD-10-CM

## 2015-10-06 NOTE — Progress Notes (Signed)
Patient Care Team: Cletis Athens, MD as PCP - General (Internal Medicine)   HPI  Bryce Clark is a 82 y.o. male Seen for ICD generator replacement. He had been initially evaluated 5/17. He has CRT-D. After lengthy discussions it was his decision to proceed with replacement of both high voltage and low voltage therapies.  He had reached EOS 2015 by ECG is still pacing  Scheduled generator replacement was interrupted by cholecystitis  treated with antibiotics  He is feeling better and would like to proceed with device generator replacement  Records reviewed form hospitalization  Past Medical History  Diagnosis Date  . Dysrhythmia   . Chronic combined systolic and diastolic CHF (congestive heart failure) ([place])   . Hypertension   . Presence of permanent cardiac pacemaker   . GERD (gastroesophageal reflux disease)   . Arthritis   . Diabetes mellitus without complication ([place])   . CAD (coronary artery disease)   . Gout   . Cardiomyopathy ([place])   . Chronic renal insufficiency   . History of permanent cardiac pacemaker placement   . COPD (chronic obstructive pulmonary disease) ([place])   . AICD (automatic cardioverter/defibrillator) present   . Myocardial infarction ([place])   . Prostate cancer ([place])   . Bladder cancer [facility])     Past Surgical History  Procedure Laterality Date  . Cardiac catheterization    . Ileostomy    . Bladder removal    . Appendectomy    . Insert / replace / remove pacemaker    . Total hip arthroplasty    . Revision urostomy cutaneous    . Cataract extraction w/phaco Left 09/12/2014    Procedure: CATARACT EXTRACTION PHACO AND INTRAOCULAR LENS PLACEMENT (IOC);  Surgeon: Estill Cotta, MD;  Location: [facility] ORS;  Service: Ophthalmology;  Laterality: Left;  [place]: 01:11.7 AP: 25.6 CDE: 29.48   . Peripheral vascular catheterization N/A 12/05/2014    Procedure: IVC Filter Insertion;  Surgeon: Algernon Huxley, MD;  Location: [place] [facility];  Service:  Cardiovascular;  Laterality: N/A;  . Cataract extraction w/phaco Right 10/17/2014    Procedure: CATARACT EXTRACTION PHACO AND INTRAOCULAR LENS PLACEMENT (IOC);  Surgeon: Estill Cotta, MD;  Location: [facility] ORS;  Service: Ophthalmology;  Laterality: Right;  [place] 01:47 AP%61.8 CDE49.41 fluid pack lot # [id number]    Current Outpatient Prescriptions  Medication Sig Dispense Refill  . BREO ELLIPTA 200-25 MCG/INH AEPB Take 1 puff by mouth daily.  1  . metoprolol (LOPRESSOR) 50 MG tablet Take 50 mg by mouth 2 (two) times daily.    . pantoprazole (PROTONIX) 40 MG tablet Take 40 mg by mouth 2 (two) times daily.  0  . traZODone (DESYREL) 100 MG tablet Take 100 mg by mouth at bedtime.  4  . apixaban (ELIQUIS) 5 MG TABS tablet Take 2 tablets (10 mg total) by mouth 2 (two) times daily. 27 tablet 0  . apixaban (ELIQUIS) 5 MG TABS tablet Take 1 tablet (5 mg total) by mouth 2 (two) times daily. 42 tablet 0  . hydrALAZINE (APRESOLINE) 50 MG tablet Take 50 mg by mouth 2 (two) times daily.  4  . loratadine (CLARITIN) 10 MG tablet Take 10 mg by mouth daily.    Marland Kitchen RANEXA 500 MG 12 hr tablet Take 500 mg by mouth 2 (two) times daily.  7   No current facility-administered medications for this visit.   Facility-Administered Medications Ordered in Other Visits  Medication Dose Route Frequency Provider Last Rate Last  Dose  . sodium chloride 0.9 % injection 10 mL  10 mL Intravenous PRN Lloyd Huger, MD   10 mL at 10/26/14 1519    Allergies  Allergen Reactions  . Azithromycin Other (See Comments)    Reaction:  Cold sweats   . Codeine Nausea And Vomiting  . Oxycodone-Acetaminophen Nausea And Vomiting  . Xarelto [Rivaroxaban] Other (See Comments)    Reaction:  Bleeding       Review of Systems negative except from HPI and PMH  Physical Exam BP 160/80 mmHg  Pulse 76  Ht 5\' 11"  (1.803 m)  Wt 172 lb 8 oz (78.245 kg)  BMI 24.07 kg/m2 Well developed and well nourished in no acute distress HENT  normal E scleral and icterus clear Neck Supple JVP flat; carotids brisk and full Clear to ausculation  Device pocket well healed; without hematoma or erythema.  There is no tethering Regular rate and rhythm, no murmurs gallops or rub Soft with active bowel sounds No clubbing cyanosis  Edema Alert and oriented, grossly normal motor and sensory function Skin Warm and Dry  ECG P-synchronous/ AV  pacing with offset  Assessment and  Plan NICM  CRT-D medtronic  CHF  Hypertension   LBBB  DVT/PE   IVC filter\  We have reviewed the benefits and risks of generator replacement.  These include but are not limited to lead fracture and infection.  The patient understands, agrees and is willing to proceed.    Will defer BP management to Dr Velora Mediate as we Lynnae Prude yet have echo information that has been requested

## 2015-10-17 ENCOUNTER — Telehealth: Payer: Self-pay | Admitting: Internal Medicine

## 2015-10-17 ENCOUNTER — Encounter: Payer: Self-pay | Admitting: *Deleted

## 2015-10-17 DIAGNOSIS — I428 Other cardiomyopathies: Secondary | ICD-10-CM

## 2015-10-17 DIAGNOSIS — Z01812 Encounter for preprocedural laboratory examination: Secondary | ICD-10-CM

## 2015-10-17 DIAGNOSIS — I509 Heart failure, unspecified: Secondary | ICD-10-CM

## 2015-10-17 NOTE — Telephone Encounter (Signed)
I called and spoke with Otila Kluver (DPR), the patient's girlfriend to confirm a date/ time for his CRT-P gen change. The patient is scheduled for 11/17/15 at 9:30 am for his procedure. He will come to the office on 11/10/15 for his pre-procedure labs. Letter of instructions to be mailed to the patient. He will need to pick up his surgical scrub on 8/4 when he comes for labs.

## 2015-10-26 ENCOUNTER — Ambulatory Visit: Admission: RE | Admit: 2015-10-26 | Payer: Commercial Managed Care - HMO | Source: Ambulatory Visit

## 2015-10-27 DIAGNOSIS — C801 Malignant (primary) neoplasm, unspecified: Secondary | ICD-10-CM | POA: Diagnosis not present

## 2015-10-27 DIAGNOSIS — Z95810 Presence of automatic (implantable) cardiac defibrillator: Secondary | ICD-10-CM | POA: Diagnosis not present

## 2015-10-27 DIAGNOSIS — I70219 Atherosclerosis of native arteries of extremities with intermittent claudication, unspecified extremity: Secondary | ICD-10-CM | POA: Diagnosis not present

## 2015-10-27 DIAGNOSIS — I10 Essential (primary) hypertension: Secondary | ICD-10-CM | POA: Diagnosis not present

## 2015-10-30 ENCOUNTER — Ambulatory Visit
Admission: RE | Admit: 2015-10-30 | Discharge: 2015-10-30 | Disposition: A | Discharge status: Home / Self Care | Payer: Commercial Managed Care - HMO | Source: Ambulatory Visit | Attending: Oncology | Admitting: Oncology

## 2015-10-30 ENCOUNTER — Ambulatory Visit: Admission: RE | Admit: 2015-10-30 | Payer: Commercial Managed Care - HMO | Source: Ambulatory Visit

## 2015-10-30 DIAGNOSIS — Z9079 Acquired absence of other genital organ(s): Secondary | ICD-10-CM | POA: Diagnosis not present

## 2015-10-30 DIAGNOSIS — K8020 Calculus of gallbladder without cholecystitis without obstruction: Secondary | ICD-10-CM | POA: Insufficient documentation

## 2015-10-30 DIAGNOSIS — C679 Malignant neoplasm of bladder, unspecified: Secondary | ICD-10-CM | POA: Insufficient documentation

## 2015-10-30 DIAGNOSIS — Z906 Acquired absence of other parts of urinary tract: Secondary | ICD-10-CM | POA: Insufficient documentation

## 2015-10-30 DIAGNOSIS — N200 Calculus of kidney: Secondary | ICD-10-CM | POA: Insufficient documentation

## 2015-11-01 ENCOUNTER — Inpatient Hospital Stay (HOSPITAL_BASED_OUTPATIENT_CLINIC_OR_DEPARTMENT_OTHER): Payer: Commercial Managed Care - HMO | Admitting: Oncology

## 2015-11-01 ENCOUNTER — Inpatient Hospital Stay: Payer: Commercial Managed Care - HMO | Attending: Oncology

## 2015-11-01 VITALS — BP 146/92 | HR 105 | Temp 98.3°F | Resp 18 | Wt 174.6 lb

## 2015-11-01 DIAGNOSIS — C679 Malignant neoplasm of bladder, unspecified: Secondary | ICD-10-CM | POA: Insufficient documentation

## 2015-11-01 DIAGNOSIS — Z79899 Other long term (current) drug therapy: Secondary | ICD-10-CM | POA: Diagnosis not present

## 2015-11-01 DIAGNOSIS — E119 Type 2 diabetes mellitus without complications: Secondary | ICD-10-CM | POA: Insufficient documentation

## 2015-11-01 DIAGNOSIS — I10 Essential (primary) hypertension: Secondary | ICD-10-CM | POA: Insufficient documentation

## 2015-11-01 DIAGNOSIS — I2510 Atherosclerotic heart disease of native coronary artery without angina pectoris: Secondary | ICD-10-CM | POA: Diagnosis not present

## 2015-11-01 DIAGNOSIS — Z8546 Personal history of malignant neoplasm of prostate: Secondary | ICD-10-CM | POA: Diagnosis not present

## 2015-11-01 DIAGNOSIS — J449 Chronic obstructive pulmonary disease, unspecified: Secondary | ICD-10-CM | POA: Diagnosis not present

## 2015-11-01 DIAGNOSIS — K219 Gastro-esophageal reflux disease without esophagitis: Secondary | ICD-10-CM | POA: Diagnosis not present

## 2015-11-01 DIAGNOSIS — I252 Old myocardial infarction: Secondary | ICD-10-CM | POA: Insufficient documentation

## 2015-11-01 LAB — CBC WITH DIFFERENTIAL/PLATELET
BASOS ABS: 0.1 10*3/uL (ref 0–0.1)
BASOS PCT: 1 %
EOS ABS: 0.4 10*3/uL (ref 0–0.7)
EOS PCT: 5 %
HCT: 44.7 % (ref 40.0–52.0)
Hemoglobin: 15.2 g/dL (ref 13.0–18.0)
Lymphocytes Relative: 28 %
Lymphs Abs: 2.3 10*3/uL (ref 1.0–3.6)
MCH: 31.9 pg (ref 26.0–34.0)
MCHC: 33.9 g/dL (ref 32.0–36.0)
MCV: 94.2 fL (ref 80.0–100.0)
MONO ABS: 0.6 10*3/uL (ref 0.2–1.0)
Monocytes Relative: 7 %
Neutro Abs: 5 10*3/uL (ref 1.4–6.5)
Neutrophils Relative %: 59 %
PLATELETS: 157 10*3/uL (ref 150–440)
RBC: 4.75 MIL/uL (ref 4.40–5.90)
RDW: 15.1 % — AB (ref 11.5–14.5)
WBC: 8.4 10*3/uL (ref 3.8–10.6)

## 2015-11-01 LAB — COMPREHENSIVE METABOLIC PANEL
ALBUMIN: 4.4 g/dL (ref 3.5–5.0)
ALK PHOS: 56 U/L (ref 38–126)
ALT: 15 U/L — AB (ref 17–63)
ANION GAP: 9 (ref 5–15)
AST: 20 U/L (ref 15–41)
BILIRUBIN TOTAL: 0.7 mg/dL (ref 0.3–1.2)
BUN: 26 mg/dL — AB (ref 6–20)
CALCIUM: 10.1 mg/dL (ref 8.9–10.3)
CO2: 21 mmol/L — ABNORMAL LOW (ref 22–32)
CREATININE: 1.93 mg/dL — AB (ref 0.61–1.24)
Chloride: 102 mmol/L (ref 101–111)
GFR calc Af Amer: 36 mL/min — ABNORMAL LOW (ref >60)
GFR calc non Af Amer: 31 mL/min — ABNORMAL LOW (ref >60)
GLUCOSE: 130 mg/dL — AB (ref 65–99)
Potassium: 4.3 mmol/L (ref 3.5–5.1)
Sodium: 132 mmol/L — ABNORMAL LOW (ref 135–145)
TOTAL PROTEIN: 7.9 g/dL (ref 6.5–8.1)

## 2015-11-01 NOTE — Progress Notes (Signed)
Bryce Clark  [phone] Fax:([phone]) [phone]  ID: Bryce Clark OB: July 21, 1932  MR#: [id number]  [id number]  Patient Care Team: Cletis Athens, MD as PCP - General (Internal Medicine)  CHIEF COMPLAINT:  Chief Complaint  Patient presents with  . Bladder Cancer    INTERVAL HISTORY: Patient returns to clinic today for repeat laboratory work and discussion of his imaging results. He continues to feel well and is asymptomatic. He does not complain of abdominal pain or bloating today. He denies any easy bleeding or bruising.  He has no neurologic complaints. He denies any recent fevers. He has a good appetite and denies weight loss. He has no chest pain or shortness of breath. He denies any nausea, vomiting, constipation, or diarrhea. Patient offers no specific complaints today.  REVIEW OF SYSTEMS:   Review of Systems  Constitutional: Negative.  Negative for fever, malaise/fatigue and weight loss.  Respiratory: Negative.  Negative for shortness of breath.   Cardiovascular: Negative.  Negative for chest pain.  Gastrointestinal: Negative.  Negative for abdominal pain.  Genitourinary: Negative.   Musculoskeletal: Negative.   Neurological: Negative.  Negative for weakness.    As per HPI. Otherwise, a complete review of systems is negatve.  PAST MEDICAL HISTORY: Past Medical History:  Diagnosis Date  . AICD (automatic cardioverter/defibrillator) present   . Arthritis   . Bladder cancer ([place])   . CAD (coronary artery disease)   . Cardiomyopathy ([place])   . Chronic combined systolic and diastolic CHF (congestive heart failure) ([place])   . Chronic renal insufficiency   . COPD (chronic obstructive pulmonary disease) ([place])   . Diabetes mellitus without complication ([place])   . Dysrhythmia   . GERD (gastroesophageal reflux disease)   . Gout   . History of permanent cardiac pacemaker placement   . Hypertension   . Myocardial infarction ([place])   . Presence  of permanent cardiac pacemaker   . Prostate cancer ([place])     PAST SURGICAL HISTORY: Past Surgical History:  Procedure Laterality Date  . APPENDECTOMY    . BLADDER REMOVAL    . CARDIAC CATHETERIZATION    . CATARACT EXTRACTION W/PHACO Left 09/12/2014   Procedure: CATARACT EXTRACTION PHACO AND INTRAOCULAR LENS PLACEMENT (IOC);  Surgeon: Estill Cotta, MD;  Location: [facility] ORS;  Service: Ophthalmology;  Laterality: Left;  [place]: 01:11.7 AP: 25.6 CDE: 29.48   . CATARACT EXTRACTION W/PHACO Right 10/17/2014   Procedure: CATARACT EXTRACTION PHACO AND INTRAOCULAR LENS PLACEMENT (IOC);  Surgeon: Estill Cotta, MD;  Location: [facility] ORS;  Service: Ophthalmology;  Laterality: Right;  [place] 01:47 AP%61.8 CDE49.41 fluid pack lot # [id number]  . ILEOSTOMY    . INSERT / REPLACE / REMOVE PACEMAKER    . PERIPHERAL VASCULAR CATHETERIZATION N/A 12/05/2014   Procedure: IVC Filter Insertion;  Surgeon: Algernon Huxley, MD;  Location: [place] [facility];  Service: Cardiovascular;  Laterality: N/A;  . REVISION UROSTOMY CUTANEOUS    . TOTAL HIP ARTHROPLASTY      FAMILY HISTORY:  Reviewed and unchanged. No reported history of malignancy or chronic disease.     ADVANCED DIRECTIVES:    HEALTH MAINTENANCE: Social History  Substance Use Topics  . Smoking status: Never Smoker  . Smokeless tobacco: Never Used  . Alcohol use No     Colonoscopy:  PAP:  Bone density:  Lipid panel:  Allergies  Allergen Reactions  . Azithromycin Other (See Comments)    Reaction:  Cold sweats   . Codeine Nausea And Vomiting  .  Oxycodone-Acetaminophen Nausea And Vomiting  . Xarelto [Rivaroxaban] Other (See Comments)    Reaction:  Bleeding     Current Outpatient Prescriptions  Medication Sig Dispense Refill  . BREO ELLIPTA 200-25 MCG/INH AEPB Take 1 puff by mouth daily.  1  . hydrALAZINE (APRESOLINE) 50 MG tablet Take 50 mg by mouth 2 (two) times daily.  4  . loratadine (CLARITIN) 10 MG tablet Take 10 mg by mouth daily.     . metoprolol (LOPRESSOR) 50 MG tablet Take 50 mg by mouth 2 (two) times daily.    . pantoprazole (PROTONIX) 40 MG tablet Take 40 mg by mouth 2 (two) times daily.  0  . traZODone (DESYREL) 100 MG tablet Take 100 mg by mouth at bedtime.  4  . RANEXA 500 MG 12 hr tablet Take 500 mg by mouth 2 (two) times daily.  7   No current facility-administered medications for this visit.    Facility-Administered Medications Ordered in Other Visits  Medication Dose Route Frequency Provider Last Rate Last Dose  . sodium chloride 0.9 % injection 10 mL  10 mL Intravenous PRN Lloyd Huger, MD   10 mL at 10/26/14 1519    OBJECTIVE: Vitals:   11/01/15 0949  BP: (!) 146/92  Pulse: (!) 105  Resp: 18  Temp: 98.3 F (36.8 C)     Body mass index is 24.35 kg/m.    ECOG FS:0 - Asymptomatic  General: Well-developed, well-nourished, no acute distress. Eyes: Pink conjunctiva, anicteric sclera. Lungs: Clear to auscultation bilaterally. Heart: Regular rate and rhythm. No rubs, murmurs, or gallops. Abdomen: Soft, nontender, nondistended. No organomegaly noted, normoactive bowel sounds. Urostomy bag noted draining clear yellow urine. Musculoskeletal: No edema, cyanosis, or clubbing. Neuro: Alert, answering all questions appropriately. Cranial nerves grossly intact. Skin: No rashes or petechiae noted. Psych: Normal affect.   LAB RESULTS:  Lab Results  Component Value Date   NA 132 (L) 11/01/2015   K 4.3 11/01/2015   CL 102 11/01/2015   CO2 21 (L) 11/01/2015   GLUCOSE 130 (H) 11/01/2015   BUN 26 (H) 11/01/2015   CREATININE 1.93 (H) 11/01/2015   CALCIUM 10.1 11/01/2015   PROT 7.9 11/01/2015   ALBUMIN 4.4 11/01/2015   AST 20 11/01/2015   ALT 15 (L) 11/01/2015   ALKPHOS 56 11/01/2015   BILITOT 0.7 11/01/2015   GFRNONAA 31 (L) 11/01/2015   GFRAA 36 (L) 11/01/2015    Lab Results  Component Value Date   WBC 8.4 11/01/2015   NEUTROABS 5.0 11/01/2015   HGB 15.2 11/01/2015   HCT 44.7 11/01/2015    MCV 94.2 11/01/2015   PLT 157 11/01/2015   Lab Results  Component Value Date   PSA 0.02 06/06/2015     STUDIES: Ct Abdomen Pelvis Wo Contrast  Result Date: 10/30/2015 CLINICAL DATA:  Restaging bladder cancer. History of prostatectomy. Prior appendectomy. EXAM: CT ABDOMEN AND PELVIS WITHOUT CONTRAST TECHNIQUE: Multidetector CT imaging of the abdomen and pelvis was performed following the standard protocol without IV contrast. COMPARISON:  09/21/2015 FINDINGS: Lower chest:  Compressive atelectasis lung bases. Hepatobiliary: No focal abnormality in the liver on this study without intravenous contrast. No evidence of hepatomegaly. Calcified stones noted in the gallbladder. No intrahepatic or extrahepatic biliary dilation. Pancreas: No focal mass lesion. No dilatation of the main duct. No intraparenchymal cyst. No peripancreatic edema. Spleen: No splenomegaly. No focal mass lesion. Adrenals/Urinary Tract: No adrenal nodule or mass. Kidneys are atrophic. 6 mm nonobstructing stone identified interpolar left kidney. No hydronephrosis  or hydroureter. Right lower quadrant ileal conduit is nondistended. There is some mild parastomal fat herniation at the urostomy. Stomach/Bowel: Stomach is nondistended. No gastric wall thickening. No evidence of outlet obstruction. Duodenum is normally positioned as is the ligament of Treitz. No small bowel wall thickening. No small bowel dilatation. Peristalsis noted right colon given normal appearance on CT 6 weeks ago. Diverticuli are seen scattered along the entire length of the colon without CT findings of diverticulitis. Vascular/Lymphatic: There is abdominal aortic atherosclerosis without aneurysm. IVC filter noted in situ There is no gastrohepatic or hepatoduodenal ligament lymphadenopathy. No intraperitoneal or retroperitoneal lymphadenopathy. No pelvic sidewall lymphadenopathy. Reproductive: Prostate gland surgically absent. Other: No intraperitoneal free fluid.  Musculoskeletal: Bone windows reveal no worrisome lytic or sclerotic osseous lesions. IMPRESSION: 1. Edema/inflammation seen on the gallbladder on the previous study has almost completely resolved in the interval. Gallstones are still evident. 2. Status post cystoprostatectomy with right total quadrant ileal conduit. No complicating features. 3. Nonobstructing left renal stone. 4. No lymphadenopathy in the abdomen or pelvis. Electronically Signed   By: Misty Stanley M.D.   On: 10/30/2015 12:58   ASSESSMENT: Recurrent bladder cancer.  PLAN:    1. Bladder cancer: CT scan results from October 30, 2015 reviewed independently with no obvious evidence of recurrence. Return to clinic in 6 months with repeat laboratory work, imaging and further evaluation. Will image every 6 months for the first 2 years and then likely yearly thereafter. Patient completed his chemotherapy with carboplatin and gemcitabine on May 09, 2014. Patient has refused any further urologic workup. 2. Renal insufficiency: Patient's creatinine is approximately at his baseline, monitor. 3. Thrombocytopenia: Patient's platelet count has now returned within normal limits. 4. History of PET positive colon lesion:  Patient does not wish to undergo colonoscopy at this time.  Patient expressed understanding and was in agreement with this plan. He also understands that He can call clinic at any time with any questions, concerns, or complaints.   Bladder cancer   Staging form: Urinary Bladder, AJCC 7th Edition     Clinical stage from 11/06/2014: Stage IV (T4, N0, M1) - Signed by Lloyd Huger, MD on 11/06/2014   Lloyd Huger, MD   11/04/2015 9:29 AM

## 2015-11-01 NOTE — Progress Notes (Signed)
States has chronic pain in left knee due to old injury.

## 2015-11-02 ENCOUNTER — Telehealth: Payer: Self-pay | Admitting: Internal Medicine

## 2015-11-02 NOTE — Telephone Encounter (Signed)
Patient's gf calls in today explaining that someone called pt today with a different time of arrival for procedure on 8/11.  She is calling to confirm correct time to arrive. Advised to arrive at 11:30 for a 1:30 procedure.  Explained that another procedure was added on to this day which moved pt's time back.   Reviewed pre procedure lab to be done in [facility] 8/4. Reviewed wound check to be done in [facility] 8/23.  She verbalized understanding and agreeable to plan.

## 2015-11-02 NOTE — Telephone Encounter (Signed)
lmov to call back

## 2015-11-02 NOTE — Telephone Encounter (Signed)
-----   Message from Emily Filbert, RN sent at 10/30/2015  4:37 PM EDT ----- Can you please call the patient/ can speak w/ Otila Kluver (girlfriend) about scheduling a wound check 10-14 days (from 8/11) in [place]. S/P- CRT-P gen change

## 2015-11-02 NOTE — Telephone Encounter (Signed)
Please call patient gf Bryce Clark to confirm instructions and arrival time for gen change on 11/17/15 at [facility].

## 2015-11-10 ENCOUNTER — Other Ambulatory Visit (INDEPENDENT_AMBULATORY_CARE_PROVIDER_SITE_OTHER): Payer: Commercial Managed Care - HMO | Admitting: *Deleted

## 2015-11-10 DIAGNOSIS — I429 Cardiomyopathy, unspecified: Secondary | ICD-10-CM

## 2015-11-10 DIAGNOSIS — I509 Heart failure, unspecified: Secondary | ICD-10-CM

## 2015-11-10 DIAGNOSIS — Z01812 Encounter for preprocedural laboratory examination: Secondary | ICD-10-CM

## 2015-11-10 DIAGNOSIS — I428 Other cardiomyopathies: Secondary | ICD-10-CM

## 2015-11-11 LAB — BASIC METABOLIC PANEL
BUN/Creatinine Ratio: 14 (ref 10–24)
BUN: 28 mg/dL — ABNORMAL HIGH (ref 8–27)
CALCIUM: 10.2 mg/dL (ref 8.6–10.2)
CHLORIDE: 99 mmol/L (ref 96–106)
CO2: 21 mmol/L (ref 18–29)
Creatinine, Ser: 1.95 mg/dL — ABNORMAL HIGH (ref 0.76–1.27)
GFR calc Af Amer: 36 mL/min/{1.73_m2} — ABNORMAL LOW (ref >59)
GFR, EST NON AFRICAN AMERICAN: 31 mL/min/{1.73_m2} — AB (ref >59)
Glucose: 166 mg/dL — ABNORMAL HIGH (ref 65–99)
POTASSIUM: 4.9 mmol/L (ref 3.5–5.2)
Sodium: 138 mmol/L (ref 134–144)

## 2015-11-11 LAB — CBC WITH DIFFERENTIAL/PLATELET
BASOS ABS: 0 10*3/uL (ref 0.0–0.2)
BASOS: 1 %
EOS (ABSOLUTE): 0.5 10*3/uL — AB (ref 0.0–0.4)
Eos: 8 %
Hematocrit: 43 % (ref 37.5–51.0)
Hemoglobin: 14.4 g/dL (ref 12.6–17.7)
IMMATURE GRANS (ABS): 0.1 10*3/uL (ref 0.0–0.1)
IMMATURE GRANULOCYTES: 2 %
LYMPHS: 21 %
Lymphocytes Absolute: 1.5 10*3/uL (ref 0.7–3.1)
MCH: 31.5 pg (ref 26.6–33.0)
MCHC: 33.5 g/dL (ref 31.5–35.7)
MCV: 94 fL (ref 79–97)
Monocytes Absolute: 0.5 10*3/uL (ref 0.1–0.9)
Monocytes: 7 %
NEUTROS PCT: 61 %
Neutrophils Absolute: 4.5 10*3/uL (ref 1.4–7.0)
PLATELETS: 181 10*3/uL (ref 150–379)
RBC: 4.57 x10E6/uL (ref 4.14–5.80)
RDW: 14.4 % (ref 12.3–15.4)
WBC: 7.1 10*3/uL (ref 3.4–10.8)

## 2015-11-17 ENCOUNTER — Ambulatory Visit (HOSPITAL_COMMUNITY)
Admission: RE | Admit: 2015-11-17 | Discharge: 2015-11-17 | Disposition: A | Discharge status: Home / Self Care | Payer: Commercial Managed Care - HMO | Source: Ambulatory Visit | Attending: Internal Medicine | Admitting: Internal Medicine

## 2015-11-17 ENCOUNTER — Encounter (HOSPITAL_COMMUNITY)
Admission: RE | Disposition: A | Discharge status: Home / Self Care | Payer: Self-pay | Source: Ambulatory Visit | Attending: Internal Medicine

## 2015-11-17 DIAGNOSIS — N189 Chronic kidney disease, unspecified: Secondary | ICD-10-CM | POA: Insufficient documentation

## 2015-11-17 DIAGNOSIS — I429 Cardiomyopathy, unspecified: Secondary | ICD-10-CM | POA: Insufficient documentation

## 2015-11-17 DIAGNOSIS — K219 Gastro-esophageal reflux disease without esophagitis: Secondary | ICD-10-CM | POA: Diagnosis not present

## 2015-11-17 DIAGNOSIS — Z95810 Presence of automatic (implantable) cardiac defibrillator: Secondary | ICD-10-CM | POA: Diagnosis not present

## 2015-11-17 DIAGNOSIS — I252 Old myocardial infarction: Secondary | ICD-10-CM | POA: Insufficient documentation

## 2015-11-17 DIAGNOSIS — Z8546 Personal history of malignant neoplasm of prostate: Secondary | ICD-10-CM | POA: Diagnosis not present

## 2015-11-17 DIAGNOSIS — I130 Hypertensive heart and chronic kidney disease with heart failure and stage 1 through stage 4 chronic kidney disease, or unspecified chronic kidney disease: Secondary | ICD-10-CM | POA: Diagnosis not present

## 2015-11-17 DIAGNOSIS — Z8551 Personal history of malignant neoplasm of bladder: Secondary | ICD-10-CM | POA: Insufficient documentation

## 2015-11-17 DIAGNOSIS — I5042 Chronic combined systolic (congestive) and diastolic (congestive) heart failure: Secondary | ICD-10-CM

## 2015-11-17 DIAGNOSIS — Z4502 Encounter for adjustment and management of automatic implantable cardiac defibrillator: Secondary | ICD-10-CM | POA: Diagnosis not present

## 2015-11-17 DIAGNOSIS — I159 Secondary hypertension, unspecified: Secondary | ICD-10-CM

## 2015-11-17 DIAGNOSIS — I2510 Atherosclerotic heart disease of native coronary artery without angina pectoris: Secondary | ICD-10-CM | POA: Diagnosis not present

## 2015-11-17 DIAGNOSIS — Z01812 Encounter for preprocedural laboratory examination: Secondary | ICD-10-CM | POA: Insufficient documentation

## 2015-11-17 DIAGNOSIS — J449 Chronic obstructive pulmonary disease, unspecified: Secondary | ICD-10-CM | POA: Insufficient documentation

## 2015-11-17 HISTORY — PX: EP IMPLANTABLE DEVICE: SHX172B

## 2015-11-17 LAB — GLUCOSE, CAPILLARY
GLUCOSE-CAPILLARY: 120 mg/dL — AB (ref 65–99)
GLUCOSE-CAPILLARY: 102 mg/dL — AB (ref 65–99)

## 2015-11-17 LAB — SURGICAL PCR SCREEN
MRSA, PCR: NEGATIVE
Staphylococcus aureus: NEGATIVE

## 2015-11-17 SURGERY — ICD/BIV ICD GENERATOR CHANGEOUT

## 2015-11-17 MED ORDER — MIDAZOLAM HCL 5 MG/5ML IJ SOLN
INTRAMUSCULAR | Status: DC | PRN
  Administered 2015-11-17: 1 mg via INTRAVENOUS

## 2015-11-17 MED ORDER — LIDOCAINE HCL (PF) 1 % IJ SOLN
INTRAMUSCULAR | Status: DC | PRN
  Administered 2015-11-17: 33 mL

## 2015-11-17 MED ORDER — VERAPAMIL HCL 2.5 MG/ML IV SOLN
INTRAVENOUS | Status: AC
  Filled 2015-11-17: qty 2

## 2015-11-17 MED ORDER — CEFAZOLIN SODIUM-DEXTROSE 2-4 GM/100ML-% IV SOLN
INTRAVENOUS | Status: AC
  Filled 2015-11-17: qty 100

## 2015-11-17 MED ORDER — SODIUM CHLORIDE 0.9 % IR SOLN
Status: AC
  Filled 2015-11-17: qty 2

## 2015-11-17 MED ORDER — CEFAZOLIN SODIUM-DEXTROSE 2-4 GM/100ML-% IV SOLN
2.0000 g | INTRAVENOUS | Status: AC
  Administered 2015-11-17: 2 g via INTRAVENOUS

## 2015-11-17 MED ORDER — ONDANSETRON HCL 4 MG/2ML IJ SOLN: 4.0000 mg | Freq: Four times a day (QID) | INTRAMUSCULAR | Status: DC | PRN

## 2015-11-17 MED ORDER — GENTAMICIN SULFATE 40 MG/ML IJ SOLN
80.0000 mg | INTRAMUSCULAR | Status: DC
  Administered 2015-11-17: 80 mg

## 2015-11-17 MED ORDER — LABETALOL HCL 5 MG/ML IV SOLN
INTRAVENOUS | Status: AC
  Filled 2015-11-17: qty 4

## 2015-11-17 MED ORDER — HEPARIN SOD (PORK) LOCK FLUSH 100 UNIT/ML IV SOLN
500.0000 [IU] | INTRAVENOUS | Status: AC | PRN
  Administered 2015-11-17: 500 [IU]

## 2015-11-17 MED ORDER — ACETAMINOPHEN 325 MG PO TABS: 325.0000 mg | ORAL_TABLET | ORAL | Status: DC | PRN

## 2015-11-17 MED ORDER — LABETALOL HCL 5 MG/ML IV SOLN
INTRAVENOUS | Status: DC | PRN
  Administered 2015-11-17: 20 mg via INTRAVENOUS

## 2015-11-17 MED ORDER — SODIUM CHLORIDE 0.9 % IV SOLN: INTRAVENOUS | Status: DC

## 2015-11-17 MED ORDER — MUPIROCIN 2 % EX OINT
TOPICAL_OINTMENT | CUTANEOUS | Status: AC
  Administered 2015-11-17: 1 via TOPICAL
  Filled 2015-11-17: qty 22

## 2015-11-17 MED ORDER — GENTAMICIN SULFATE 40 MG/ML IJ SOLN: 80.0000 mg | INTRAMUSCULAR | Status: DC

## 2015-11-17 MED ORDER — MIDAZOLAM HCL 5 MG/5ML IJ SOLN
INTRAMUSCULAR | Status: AC
  Filled 2015-11-17: qty 5

## 2015-11-17 MED ORDER — MUPIROCIN 2 % EX OINT
1.0000 "application " | TOPICAL_OINTMENT | Freq: Once | CUTANEOUS | Status: AC
  Administered 2015-11-17: 1 via TOPICAL

## 2015-11-17 MED ORDER — FENTANYL CITRATE (PF) 100 MCG/2ML IJ SOLN
INTRAMUSCULAR | Status: AC
  Filled 2015-11-17: qty 2

## 2015-11-17 MED ORDER — SODIUM CHLORIDE 0.9 % IV SOLN
INTRAVENOUS | Status: DC
  Administered 2015-11-17: 11:00:00 via INTRAVENOUS

## 2015-11-17 MED ORDER — CEFAZOLIN SODIUM-DEXTROSE 2-4 GM/100ML-% IV SOLN: 2.0000 g | INTRAVENOUS | Status: DC

## 2015-11-17 MED ORDER — LIDOCAINE HCL (PF) 1 % IJ SOLN
INTRAMUSCULAR | Status: AC
  Filled 2015-11-17: qty 60

## 2015-11-17 SURGICAL SUPPLY — 5 items
CABLE SURGICAL S-101-97-12 (CABLE) ×3 IMPLANT
HEMOSTAT SURGICEL 2X4 FIBR (HEMOSTASIS) ×3 IMPLANT
ICD VIVA XT CRT-D DTBA1D1 (ICD Generator) ×3 IMPLANT
PAD DEFIB LIFELINK (PAD) ×3 IMPLANT
TRAY PACEMAKER INSERTION (PACKS) ×3 IMPLANT

## 2015-11-17 NOTE — Interval H&P Note (Signed)
ICD Criteria  Current LVEF:30%. Within 12 months prior to implant: No   Heart failure history: Yes, Class II  Cardiomyopathy history: Yes, Non-Ischemic Cardiomyopathy.  Atrial Fibrillation/Atrial Flutter: No.  Ventricular tachycardia history: No.  Cardiac arrest history: No.  History of syndromes with risk of sudden death: No.  Previous ICD: Yes, Reason for ICD:  Primary prevention.  Current ICD indication: Primary  PPM indication: No.   Class I or II Bradycardia indication present: No  Beta Blocker therapy for 3 or more months: Yes, prescribed.   Ace Inhibitor/ARB therapy for 3 or more months: No, medical reason.  History and Physical Interval Note:  11/17/2015 12:53 PM  Bryce Clark  has presented today for surgery, with the diagnosis of cm - hf - eri  The various methods of treatment have been discussed with the patient and family. After consideration of risks, benefits and other options for treatment, the patient has consented to  Procedure(s): BIV PPM Generator Changeout (N/A) as a surgical intervention .  The patient's history has been reviewed, patient examined, no change in status, stable for surgery.  I have reviewed the patient's chart and labs.  Questions were answered to the patient's satisfaction.     Virl Axe

## 2015-11-17 NOTE — H&P (Signed)
Patient Care Team: Cletis Athens, MD as PCP - General (Internal Medicine)   HPI  Bryce Clark is a 82 y.o. male admitted for ICD generator replacement.   Implanted [facility] for primary prevention in setting of nonischemic cardiomyopathy  He was followed elsewhere and initially evaluated 5/17. He has CRT-D. After lengthy discussions it was his decision to proceed with replacement of both high voltage and low voltage therapies.  He had reached EOS 2015 by ECG is still pacing  Scheduled generator replacement was interrupted by cholecystitis  treated with antibiotics      Records reviewed form hospitalization [facility] 2010  EF 31% iNTERVAL ECHO ordered but not consummated    Has hx of bladder cancer with neg PET 2016  Past Medical History:  Diagnosis Date  . AICD (automatic cardioverter/defibrillator) present   . Arthritis   . Bladder cancer ([place])   . CAD (coronary artery disease)   . Cardiomyopathy ([place])   . Chronic combined systolic and diastolic CHF (congestive heart failure) ([place])   . Chronic renal insufficiency   . COPD (chronic obstructive pulmonary disease) ([place])   . Diabetes mellitus without complication ([place])   . Dysrhythmia   . GERD (gastroesophageal reflux disease)   . Gout   . History of permanent cardiac pacemaker placement   . Hypertension   . Myocardial infarction ([place])   . Presence of permanent cardiac pacemaker   . Prostate cancer [facility])     Past Surgical History:  Procedure Laterality Date  . APPENDECTOMY    . BLADDER REMOVAL    . CARDIAC CATHETERIZATION    . CATARACT EXTRACTION W/PHACO Left 09/12/2014   Procedure: CATARACT EXTRACTION PHACO AND INTRAOCULAR LENS PLACEMENT (IOC);  Surgeon: Estill Cotta, MD;  Location: [facility] ORS;  Service: Ophthalmology;  Laterality: Left;  [place]: 01:11.7 AP: 25.6 CDE: 29.48   . CATARACT EXTRACTION W/PHACO Right 10/17/2014   Procedure: CATARACT EXTRACTION PHACO AND INTRAOCULAR LENS PLACEMENT (IOC);   Surgeon: Estill Cotta, MD;  Location: [facility] ORS;  Service: Ophthalmology;  Laterality: Right;  [place] 01:47 AP%61.8 CDE49.41 fluid pack lot # [id number]  . ILEOSTOMY    . INSERT / REPLACE / REMOVE PACEMAKER    . PERIPHERAL VASCULAR CATHETERIZATION N/A 12/05/2014   Procedure: IVC Filter Insertion;  Surgeon: Algernon Huxley, MD;  Location: [facility];  Service: Cardiovascular;  Laterality: N/A;  . REVISION UROSTOMY CUTANEOUS    . TOTAL HIP ARTHROPLASTY      Current Facility-Administered Medications  Medication Dose Route Frequency Provider Last Rate Last Dose  . 0.9 %  sodium chloride infusion   Intravenous Continuous Deboraha Sprang, MD 50 mL/hr at 11/17/15 1123    . ceFAZolin (ANCEF) IVPB 2g/100 mL premix  2 g Intravenous On Call Deboraha Sprang, MD      . gentamicin (GARAMYCIN) 80 mg in sodium chloride irrigation 0.9 % 500 mL irrigation  80 mg Irrigation On Call Deboraha Sprang, MD       Facility-Administered Medications Ordered in Other Encounters  Medication Dose Route Frequency Provider Last Rate Last Dose  . sodium chloride 0.9 % injection 10 mL  10 mL Intravenous PRN Lloyd Huger, MD   10 mL at 10/26/14 1519    Allergies  Allergen Reactions  . Xarelto [Rivaroxaban] Other (See Comments)    Reaction:  Bleeding   . Azithromycin Other (See Comments)    Reaction:  Cold sweats   . Codeine Nausea And Vomiting  .  Oxycodone-Acetaminophen Nausea And Vomiting   Current Facility-Administered Medications on File Prior to Encounter  Medication  . sodium chloride 0.9 % injection 10 mL   Current Outpatient Prescriptions on File Prior to Encounter  Medication Sig  . BREO ELLIPTA 200-25 MCG/INH AEPB Take 1 puff by mouth daily as needed (for shortness of breath).   . hydrALAZINE (APRESOLINE) 50 MG tablet Take 50 mg by mouth 2 (two) times daily.  . metoprolol (LOPRESSOR) 50 MG tablet Take 50 mg by mouth 2 (two) times daily.  . pantoprazole (PROTONIX) 40 MG tablet Take 40 mg by mouth  2 (two) times daily.  . traZODone (DESYREL) 100 MG tablet Take 100 mg by mouth at bedtime.   Social History   Social History  . Marital status: Divorced    Spouse name: N/A  . Number of children: N/A  . Years of education: N/A   Occupational History  . Not on file.   Social History Main Topics  . Smoking status: Never Smoker  . Smokeless tobacco: Never Used  . Alcohol use No  . Drug use: No  . Sexual activity: Yes   Other Topics Concern  . Not on file   Social History Narrative  . No narrative on file   Family History  Problem Relation Age of Onset  . Stroke Father       Review of Systems negative except from HPI and PMH  Physical Exam BP (!) 146/91   Pulse 95   Temp 98.2 F (36.8 C) (Oral)   Resp 20   Ht 5\' 11"  (1.803 m)   Wt 173 lb (78.5 kg)   SpO2 97%   BMI 24.13 kg/m  Well developed and well nourished in no acute distress HENT normal E scleral and icterus clear Neck Supple JVP flat; carotids brisk and full Clear to ausculation Device pocket well healed; without hematoma or erythema.  There is no tethering  Regular rate and rhythm, no murmurs gallops or rub Soft with active bowel sounds No clubbing cyanosis  Edema Alert and oriented, grossly normal motor and sensory function Skin Warm and Dry    Assessment and  Plan  NICM  CHF chronic systolic  CRT-D  [employer]  Cancer --bladder  Remission   Renal Insufficiency    The pt would like to replace CRT-D with same  Reasonable per AUc  We have reviewed the benefits and risks of generator replacement.  These include but are not limited to lead fracture and infection.  The patient understands, agrees and is willing to proceed.

## 2015-11-17 NOTE — Discharge Instructions (Signed)

## 2015-11-20 ENCOUNTER — Encounter (HOSPITAL_COMMUNITY): Payer: Self-pay | Admitting: Internal Medicine

## 2015-11-20 MED FILL — Fentanyl Citrate Preservative Free (PF) Inj 100 MCG/2ML: INTRAMUSCULAR | Qty: 2 | Status: AC

## 2015-11-22 ENCOUNTER — Encounter (HOSPITAL_COMMUNITY): Payer: Self-pay | Admitting: Cardiology

## 2015-11-29 ENCOUNTER — Ambulatory Visit: Payer: Commercial Managed Care - HMO

## 2015-11-30 DIAGNOSIS — I509 Heart failure, unspecified: Secondary | ICD-10-CM | POA: Diagnosis not present

## 2015-11-30 DIAGNOSIS — C801 Malignant (primary) neoplasm, unspecified: Secondary | ICD-10-CM | POA: Diagnosis not present

## 2015-11-30 DIAGNOSIS — Z95810 Presence of automatic (implantable) cardiac defibrillator: Secondary | ICD-10-CM | POA: Diagnosis not present

## 2015-11-30 DIAGNOSIS — I70219 Atherosclerosis of native arteries of extremities with intermittent claudication, unspecified extremity: Secondary | ICD-10-CM | POA: Diagnosis not present

## 2015-12-04 DIAGNOSIS — L309 Dermatitis, unspecified: Secondary | ICD-10-CM | POA: Diagnosis not present

## 2015-12-04 DIAGNOSIS — T82847A Pain from cardiac prosthetic devices, implants and grafts, initial encounter: Secondary | ICD-10-CM | POA: Diagnosis not present

## 2015-12-04 DIAGNOSIS — G620 Drug-induced polyneuropathy: Secondary | ICD-10-CM | POA: Diagnosis not present

## 2015-12-04 DIAGNOSIS — T451X5A Adverse effect of antineoplastic and immunosuppressive drugs, initial encounter: Secondary | ICD-10-CM | POA: Diagnosis not present

## 2015-12-14 DIAGNOSIS — H16223 Keratoconjunctivitis sicca, not specified as Sjogren's, bilateral: Secondary | ICD-10-CM | POA: Diagnosis not present

## 2015-12-18 DIAGNOSIS — L309 Dermatitis, unspecified: Secondary | ICD-10-CM | POA: Diagnosis not present

## 2015-12-18 DIAGNOSIS — R413 Other amnesia: Secondary | ICD-10-CM | POA: Diagnosis not present

## 2015-12-18 DIAGNOSIS — C801 Malignant (primary) neoplasm, unspecified: Secondary | ICD-10-CM | POA: Diagnosis not present

## 2015-12-18 DIAGNOSIS — I509 Heart failure, unspecified: Secondary | ICD-10-CM | POA: Diagnosis not present

## 2015-12-26 DIAGNOSIS — Z436 Encounter for attention to other artificial openings of urinary tract: Secondary | ICD-10-CM | POA: Diagnosis not present

## 2015-12-26 DIAGNOSIS — Z936 Other artificial openings of urinary tract status: Secondary | ICD-10-CM | POA: Diagnosis not present

## 2016-01-01 DIAGNOSIS — L309 Dermatitis, unspecified: Secondary | ICD-10-CM | POA: Diagnosis not present

## 2016-01-01 DIAGNOSIS — L821 Other seborrheic keratosis: Secondary | ICD-10-CM | POA: Diagnosis not present

## 2016-02-05 DIAGNOSIS — G620 Drug-induced polyneuropathy: Secondary | ICD-10-CM | POA: Diagnosis not present

## 2016-02-05 DIAGNOSIS — C801 Malignant (primary) neoplasm, unspecified: Secondary | ICD-10-CM | POA: Diagnosis not present

## 2016-02-05 DIAGNOSIS — T451X5A Adverse effect of antineoplastic and immunosuppressive drugs, initial encounter: Secondary | ICD-10-CM | POA: Diagnosis not present

## 2016-02-05 DIAGNOSIS — I509 Heart failure, unspecified: Secondary | ICD-10-CM | POA: Diagnosis not present

## 2016-02-08 DIAGNOSIS — E785 Hyperlipidemia, unspecified: Secondary | ICD-10-CM | POA: Diagnosis not present

## 2016-02-08 DIAGNOSIS — I509 Heart failure, unspecified: Secondary | ICD-10-CM | POA: Diagnosis not present

## 2016-02-08 DIAGNOSIS — I10 Essential (primary) hypertension: Secondary | ICD-10-CM | POA: Diagnosis not present

## 2016-02-08 DIAGNOSIS — Z950 Presence of cardiac pacemaker: Secondary | ICD-10-CM | POA: Diagnosis not present

## 2016-02-08 DIAGNOSIS — E1165 Type 2 diabetes mellitus with hyperglycemia: Secondary | ICD-10-CM | POA: Diagnosis not present

## 2016-02-21 DIAGNOSIS — Z436 Encounter for attention to other artificial openings of urinary tract: Secondary | ICD-10-CM | POA: Diagnosis not present

## 2016-02-21 DIAGNOSIS — Z936 Other artificial openings of urinary tract status: Secondary | ICD-10-CM | POA: Diagnosis not present

## 2016-02-23 DIAGNOSIS — E785 Hyperlipidemia, unspecified: Secondary | ICD-10-CM | POA: Diagnosis not present

## 2016-02-23 DIAGNOSIS — E1165 Type 2 diabetes mellitus with hyperglycemia: Secondary | ICD-10-CM | POA: Diagnosis not present

## 2016-02-23 DIAGNOSIS — I10 Essential (primary) hypertension: Secondary | ICD-10-CM | POA: Diagnosis not present

## 2016-03-07 DIAGNOSIS — M109 Gout, unspecified: Secondary | ICD-10-CM | POA: Diagnosis not present

## 2016-03-07 DIAGNOSIS — E1165 Type 2 diabetes mellitus with hyperglycemia: Secondary | ICD-10-CM | POA: Diagnosis not present

## 2016-03-07 DIAGNOSIS — R413 Other amnesia: Secondary | ICD-10-CM | POA: Diagnosis not present

## 2016-03-07 DIAGNOSIS — E785 Hyperlipidemia, unspecified: Secondary | ICD-10-CM | POA: Diagnosis not present

## 2016-04-19 DIAGNOSIS — E785 Hyperlipidemia, unspecified: Secondary | ICD-10-CM | POA: Diagnosis not present

## 2016-04-19 DIAGNOSIS — Z950 Presence of cardiac pacemaker: Secondary | ICD-10-CM | POA: Diagnosis not present

## 2016-04-19 DIAGNOSIS — J209 Acute bronchitis, unspecified: Secondary | ICD-10-CM | POA: Diagnosis not present

## 2016-04-19 DIAGNOSIS — E1165 Type 2 diabetes mellitus with hyperglycemia: Secondary | ICD-10-CM | POA: Diagnosis not present

## 2016-05-02 DIAGNOSIS — M109 Gout, unspecified: Secondary | ICD-10-CM | POA: Diagnosis not present

## 2016-05-02 DIAGNOSIS — E785 Hyperlipidemia, unspecified: Secondary | ICD-10-CM | POA: Diagnosis not present

## 2016-05-02 DIAGNOSIS — J209 Acute bronchitis, unspecified: Secondary | ICD-10-CM | POA: Diagnosis not present

## 2016-05-02 DIAGNOSIS — E1165 Type 2 diabetes mellitus with hyperglycemia: Secondary | ICD-10-CM | POA: Diagnosis not present

## 2016-05-03 ENCOUNTER — Other Ambulatory Visit: Payer: Commercial Managed Care - HMO

## 2016-05-03 ENCOUNTER — Ambulatory Visit: Payer: Medicare HMO

## 2016-05-05 NOTE — Progress Notes (Deleted)
[place]  [phone] [phone]  ID: Bryce Clark OB: 09/26/1932  MR#: [id number]  [id number]  Patient Care Team: Cletis Athens, MD as PCP - General (Internal Medicine)  CHIEF COMPLAINT: Recurrent bladder cancer.  INTERVAL HISTORY: Patient returns to clinic today for repeat laboratory work and discussion of his imaging results. He continues to feel well and is asymptomatic. He does not complain of abdominal pain or bloating today. He denies any easy bleeding or bruising.  He has no neurologic complaints. He denies any recent fevers. He has a good appetite and denies weight loss. He has no chest pain or shortness of breath. He denies any nausea, vomiting, constipation, or diarrhea. Patient offers no specific complaints today.  REVIEW OF SYSTEMS:   Review of Systems  Constitutional: Negative.  Negative for fever, malaise/fatigue and weight loss.  Respiratory: Negative.  Negative for shortness of breath.   Cardiovascular: Negative.  Negative for chest pain.  Gastrointestinal: Negative.  Negative for abdominal pain.  Genitourinary: Negative.   Musculoskeletal: Negative.   Neurological: Negative.  Negative for weakness.    As per HPI. Otherwise, a complete review of systems is negatve.  PAST MEDICAL HISTORY: Past Medical History:  Diagnosis Date  . AICD (automatic cardioverter/defibrillator) present   . Arthritis   . Bladder cancer ([place])   . CAD (coronary artery disease)   . Cardiomyopathy ([place])   . Chronic combined systolic and diastolic CHF (congestive heart failure) ([place])   . Chronic renal insufficiency   . COPD (chronic obstructive pulmonary disease) ([place])   . Diabetes mellitus without complication ([place])   . Dysrhythmia   . GERD (gastroesophageal reflux disease)   . Gout   . History of permanent cardiac pacemaker placement   . Hypertension   . Myocardial infarction   . Presence of permanent cardiac pacemaker   . Prostate  cancer ([place])     PAST SURGICAL HISTORY: Past Surgical History:  Procedure Laterality Date  . APPENDECTOMY    . BLADDER REMOVAL    . CARDIAC CATHETERIZATION    . CATARACT EXTRACTION W/PHACO Left 09/12/2014   Procedure: CATARACT EXTRACTION PHACO AND INTRAOCULAR LENS PLACEMENT (IOC);  Surgeon: Estill Cotta, MD;  Location: [facility] ORS;  Service: Ophthalmology;  Laterality: Left;  [place]: 01:11.7 AP: 25.6 CDE: 29.48   . CATARACT EXTRACTION W/PHACO Right 10/17/2014   Procedure: CATARACT EXTRACTION PHACO AND INTRAOCULAR LENS PLACEMENT (IOC);  Surgeon: Estill Cotta, MD;  Location: [facility] ORS;  Service: Ophthalmology;  Laterality: Right;  [place] 01:47 AP%61.8 CDE49.41 fluid pack lot # [id number]  . EP IMPLANTABLE DEVICE N/A 11/17/2015   Procedure: ICD/BIV ICD Generator Changeout;  Surgeon: Deboraha Sprang, MD;  Location: [facility];  Service: Cardiovascular;  Laterality: N/A;  . ILEOSTOMY    . INSERT / REPLACE / REMOVE PACEMAKER    . PERIPHERAL VASCULAR CATHETERIZATION N/A 12/05/2014   Procedure: IVC Filter Insertion;  Surgeon: Algernon Huxley, MD;  Location: [facility];  Service: Cardiovascular;  Laterality: N/A;  . REVISION UROSTOMY CUTANEOUS    . TOTAL HIP ARTHROPLASTY      FAMILY HISTORY:  Reviewed and unchanged. No reported history of malignancy or chronic disease.     ADVANCED DIRECTIVES:    HEALTH MAINTENANCE: Social History  Substance Use Topics  . Smoking status: Never Smoker  . Smokeless tobacco: Never Used  . Alcohol use No     Colonoscopy:  PAP:  Bone density:  Lipid panel:  Allergies  Allergen Reactions  .  Xarelto [Rivaroxaban] Other (See Comments)    Reaction:  Bleeding   . Azithromycin Other (See Comments)    Reaction:  Cold sweats   . Codeine Nausea And Vomiting  . Oxycodone-Acetaminophen Nausea And Vomiting    Current Outpatient Prescriptions  Medication Sig Dispense Refill  . BREO ELLIPTA 200-25 MCG/INH AEPB Take 1 puff by mouth daily as needed  (for shortness of breath).   1  . DimenhyDRINATE (DRAMAMINE PO) Take 1 tablet by mouth every morning.    . diphenhydrAMINE (BENADRYL) 25 mg capsule Take 25 mg by mouth every 6 (six) hours as needed for allergies.    . hydrALAZINE (APRESOLINE) 50 MG tablet Take 50 mg by mouth 2 (two) times daily.  4  . metoprolol (LOPRESSOR) 50 MG tablet Take 50 mg by mouth 2 (two) times daily.    Marland Kitchen OVER THE COUNTER MEDICATION Place 1 drop into both eyes as needed (for dry eyes). OTC Lubricant Eye Drop    . pantoprazole (PROTONIX) 40 MG tablet Take 40 mg by mouth 2 (two) times daily.  0  . sucralfate (CARAFATE) 1 g tablet Take 1 g by mouth daily as needed (for stomach).    . traZODone (DESYREL) 100 MG tablet Take 100 mg by mouth at bedtime.  4   No current facility-administered medications for this visit.    Facility-Administered Medications Ordered in Other Visits  Medication Dose Route Frequency Provider Last Rate Last Dose  . sodium chloride 0.9 % injection 10 mL  10 mL Intravenous PRN Lloyd Huger, MD   10 mL at 10/26/14 1519    OBJECTIVE: There were no vitals filed for this visit.   There is no height or weight on file to calculate BMI.    ECOG FS:0 - Asymptomatic  General: Well-developed, well-nourished, no acute distress. Eyes: Pink conjunctiva, anicteric sclera. Lungs: Clear to auscultation bilaterally. Heart: Regular rate and rhythm. No rubs, murmurs, or gallops. Abdomen: Soft, nontender, nondistended. No organomegaly noted, normoactive bowel sounds. Urostomy bag noted draining clear yellow urine. Musculoskeletal: No edema, cyanosis, or clubbing. Neuro: Alert, answering all questions appropriately. Cranial nerves grossly intact. Skin: No rashes or petechiae noted. Psych: Normal affect.   LAB RESULTS:  Lab Results  Component Value Date   NA 138 11/10/2015   K 4.9 11/10/2015   CL 99 11/10/2015   CO2 21 11/10/2015   GLUCOSE 166 (H) 11/10/2015   BUN 28 (H) 11/10/2015   CREATININE  1.95 (H) 11/10/2015   CALCIUM 10.2 11/10/2015   PROT 7.9 11/01/2015   ALBUMIN 4.4 11/01/2015   AST 20 11/01/2015   ALT 15 (L) 11/01/2015   ALKPHOS 56 11/01/2015   BILITOT 0.7 11/01/2015   GFRNONAA 31 (L) 11/10/2015   GFRAA 36 (L) 11/10/2015    Lab Results  Component Value Date   WBC 7.1 11/10/2015   NEUTROABS 4.5 11/10/2015   HGB 15.2 11/01/2015   HCT 43.0 11/10/2015   MCV 94 11/10/2015   PLT 181 11/10/2015   Lab Results  Component Value Date   PSA 0.02 06/06/2015     STUDIES: No results found.  ASSESSMENT: Recurrent bladder cancer.  PLAN:    1. Bladder cancer: CT scan results from October 30, 2015 reviewed independently with no obvious evidence of recurrence. Return to clinic in 6 months with repeat laboratory work, imaging and further evaluation. Will image every 6 months for the first 2 years and then likely yearly thereafter. Patient completed his chemotherapy with carboplatin and gemcitabine on May 09, 2014. Patient has refused any further urologic workup. 2. Renal insufficiency: Patient's creatinine is approximately at his baseline, monitor. 3. Thrombocytopenia: Patient's platelet count has now returned within normal limits. 4. History of PET positive colon lesion:  Patient does not wish to undergo colonoscopy at this time.  Patient expressed understanding and was in agreement with this plan. He also understands that He can call clinic at any time with any questions, concerns, or complaints.   Bladder cancer   Staging form: Urinary Bladder, AJCC 7th Edition     Clinical stage from 11/06/2014: Stage IV (T4, N0, M1) - Signed by Lloyd Huger, MD on 11/06/2014   Lloyd Huger, MD   05/05/2016 10:57 PM

## 2016-05-06 ENCOUNTER — Inpatient Hospital Stay: Payer: Medicare HMO | Admitting: Oncology

## 2016-05-06 ENCOUNTER — Inpatient Hospital Stay: Payer: Medicare HMO

## 2016-05-06 ENCOUNTER — Inpatient Hospital Stay: Payer: Medicare HMO | Attending: Oncology

## 2016-05-06 DIAGNOSIS — Z95828 Presence of other vascular implants and grafts: Secondary | ICD-10-CM

## 2016-05-06 DIAGNOSIS — C679 Malignant neoplasm of bladder, unspecified: Secondary | ICD-10-CM

## 2016-05-06 DIAGNOSIS — I10 Essential (primary) hypertension: Secondary | ICD-10-CM | POA: Insufficient documentation

## 2016-05-06 DIAGNOSIS — I252 Old myocardial infarction: Secondary | ICD-10-CM | POA: Diagnosis not present

## 2016-05-06 DIAGNOSIS — I2510 Atherosclerotic heart disease of native coronary artery without angina pectoris: Secondary | ICD-10-CM | POA: Insufficient documentation

## 2016-05-06 DIAGNOSIS — K219 Gastro-esophageal reflux disease without esophagitis: Secondary | ICD-10-CM | POA: Diagnosis not present

## 2016-05-06 DIAGNOSIS — J449 Chronic obstructive pulmonary disease, unspecified: Secondary | ICD-10-CM | POA: Insufficient documentation

## 2016-05-06 DIAGNOSIS — Z79899 Other long term (current) drug therapy: Secondary | ICD-10-CM | POA: Diagnosis not present

## 2016-05-06 DIAGNOSIS — Z8546 Personal history of malignant neoplasm of prostate: Secondary | ICD-10-CM | POA: Diagnosis not present

## 2016-05-06 DIAGNOSIS — E119 Type 2 diabetes mellitus without complications: Secondary | ICD-10-CM | POA: Insufficient documentation

## 2016-05-06 LAB — CBC WITH DIFFERENTIAL/PLATELET
Basophils Absolute: 0.1 10*3/uL (ref 0–0.1)
Basophils Relative: 1 %
EOS ABS: 0.7 10*3/uL (ref 0–0.7)
Eosinophils Relative: 8 %
HEMATOCRIT: 45.8 % (ref 40.0–52.0)
HEMOGLOBIN: 15.6 g/dL (ref 13.0–18.0)
LYMPHS ABS: 2 10*3/uL (ref 1.0–3.6)
LYMPHS PCT: 24 %
MCH: 32 pg (ref 26.0–34.0)
MCHC: 34 g/dL (ref 32.0–36.0)
MCV: 94.1 fL (ref 80.0–100.0)
Monocytes Absolute: 0.6 10*3/uL (ref 0.2–1.0)
Monocytes Relative: 7 %
NEUTROS PCT: 60 %
Neutro Abs: 5.2 10*3/uL (ref 1.4–6.5)
Platelets: 130 10*3/uL — ABNORMAL LOW (ref 150–440)
RBC: 4.86 MIL/uL (ref 4.40–5.90)
RDW: 13.7 % (ref 11.5–14.5)
WBC: 8.6 10*3/uL (ref 3.8–10.6)

## 2016-05-06 LAB — COMPREHENSIVE METABOLIC PANEL
ALK PHOS: 55 U/L (ref 38–126)
ALT: 22 U/L (ref 17–63)
AST: 23 U/L (ref 15–41)
Albumin: 4.2 g/dL (ref 3.5–5.0)
Anion gap: 9 (ref 5–15)
BILIRUBIN TOTAL: 0.9 mg/dL (ref 0.3–1.2)
BUN: 26 mg/dL — ABNORMAL HIGH (ref 6–20)
CALCIUM: 10.4 mg/dL — AB (ref 8.9–10.3)
CO2: 26 mmol/L (ref 22–32)
CREATININE: 1.81 mg/dL — AB (ref 0.61–1.24)
Chloride: 101 mmol/L (ref 101–111)
GFR calc non Af Amer: 33 mL/min — ABNORMAL LOW (ref >60)
GFR, EST AFRICAN AMERICAN: 38 mL/min — AB (ref >60)
Glucose, Bld: 131 mg/dL — ABNORMAL HIGH (ref 65–99)
Potassium: 4.2 mmol/L (ref 3.5–5.1)
SODIUM: 136 mmol/L (ref 135–145)
Total Protein: 7.3 g/dL (ref 6.5–8.1)

## 2016-05-06 MED ORDER — HEPARIN SOD (PORK) LOCK FLUSH 100 UNIT/ML IV SOLN
500.0000 [IU] | Freq: Once | INTRAVENOUS | Status: AC
  Administered 2016-05-06: 500 [IU] via INTRAVENOUS

## 2016-05-06 MED ORDER — SODIUM CHLORIDE 0.9% FLUSH
10.0000 mL | INTRAVENOUS | Status: DC | PRN
  Administered 2016-05-06: 10 mL via INTRAVENOUS
  Filled 2016-05-06: qty 10

## 2016-05-07 ENCOUNTER — Telehealth: Payer: Self-pay | Admitting: *Deleted

## 2016-05-07 DIAGNOSIS — Z436 Encounter for attention to other artificial openings of urinary tract: Secondary | ICD-10-CM | POA: Diagnosis not present

## 2016-05-07 DIAGNOSIS — Z936 Other artificial openings of urinary tract status: Secondary | ICD-10-CM | POA: Diagnosis not present

## 2016-05-07 NOTE — Telephone Encounter (Signed)
Pt agreed to CT scan without contrast. Message sent to scheduling and pt will be notified with appt.

## 2016-05-07 NOTE — Telephone Encounter (Signed)
Per Dr. Grayland Ormond, pt can be scheduled for Non-contrast CT scan then follow up after scan.

## 2016-05-07 NOTE — Telephone Encounter (Signed)
Pt notified that lab results were normal. Pt was informed that MD recommends CT scan every 6 months to follow up bladder cancer. Pt states that "does not want anymore of those needle sticks." Informed pt that Dr. Grayland Ormond would still like to follow up with him. Pt stated that would be okay.

## 2016-05-09 DIAGNOSIS — I739 Peripheral vascular disease, unspecified: Secondary | ICD-10-CM | POA: Diagnosis not present

## 2016-05-09 DIAGNOSIS — M109 Gout, unspecified: Secondary | ICD-10-CM | POA: Diagnosis not present

## 2016-05-09 DIAGNOSIS — Z950 Presence of cardiac pacemaker: Secondary | ICD-10-CM | POA: Diagnosis not present

## 2016-05-09 DIAGNOSIS — C801 Malignant (primary) neoplasm, unspecified: Secondary | ICD-10-CM | POA: Diagnosis not present

## 2016-05-09 DIAGNOSIS — Z95810 Presence of automatic (implantable) cardiac defibrillator: Secondary | ICD-10-CM | POA: Diagnosis not present

## 2016-05-14 ENCOUNTER — Ambulatory Visit
Admission: RE | Admit: 2016-05-14 | Discharge: 2016-05-14 | Disposition: A | Discharge status: Home / Self Care | Payer: Medicare HMO | Source: Ambulatory Visit | Attending: Oncology | Admitting: Oncology

## 2016-05-14 DIAGNOSIS — K8020 Calculus of gallbladder without cholecystitis without obstruction: Secondary | ICD-10-CM | POA: Insufficient documentation

## 2016-05-14 DIAGNOSIS — C679 Malignant neoplasm of bladder, unspecified: Secondary | ICD-10-CM

## 2016-05-14 DIAGNOSIS — I700 Atherosclerosis of aorta: Secondary | ICD-10-CM | POA: Diagnosis not present

## 2016-05-14 DIAGNOSIS — N200 Calculus of kidney: Secondary | ICD-10-CM | POA: Insufficient documentation

## 2016-05-15 NOTE — Progress Notes (Signed)
[facility]  [phone] [phone]  ID: Bryce Clark OB: 03-12-1933  MR#: [id number]  [id number]  Patient Care Team: Cletis Athens, MD as PCP - General (Internal Medicine)  CHIEF COMPLAINT: Recurrent bladder cancer.  INTERVAL HISTORY: Patient returns to clinic today for repeat laboratory work and discussion of his imaging results. He continues to feel well and is asymptomatic. He does not complain of abdominal pain or bloating today. He denies any easy bleeding or bruising.  He has no neurologic complaints. He denies any recent fevers. He has a good appetite and denies weight loss. He has no chest pain or shortness of breath. He denies any nausea, vomiting, constipation, or diarrhea. Patient offers no specific complaints today.  REVIEW OF SYSTEMS:   Review of Systems  Constitutional: Negative.  Negative for fever, malaise/fatigue and weight loss.  Respiratory: Negative.  Negative for shortness of breath.   Cardiovascular: Negative.  Negative for chest pain and leg swelling.  Gastrointestinal: Negative.  Negative for abdominal pain.  Genitourinary: Negative.  Negative for hematuria and urgency.  Musculoskeletal: Negative.   Neurological: Negative.  Negative for weakness.  Psychiatric/Behavioral: Negative.  The patient is not nervous/anxious.     As per HPI. Otherwise, a complete review of systems is negative.  PAST MEDICAL HISTORY: Past Medical History:  Diagnosis Date  . AICD (automatic cardioverter/defibrillator) present   . Arthritis   . Bladder cancer ([place])   . CAD (coronary artery disease)   . Cardiomyopathy ([place])   . Chronic combined systolic and diastolic CHF (congestive heart failure) ([place])   . Chronic renal insufficiency   . COPD (chronic obstructive pulmonary disease) ([place])   . Diabetes mellitus without complication ([place])   . Dysrhythmia   . GERD (gastroesophageal reflux disease)   . Gout   . History of permanent  cardiac pacemaker placement   . Hypertension   . Myocardial infarction   . Presence of permanent cardiac pacemaker   . Prostate cancer ([place])     PAST SURGICAL HISTORY: Past Surgical History:  Procedure Laterality Date  . APPENDECTOMY    . BLADDER REMOVAL    . CARDIAC CATHETERIZATION    . CATARACT EXTRACTION W/PHACO Left 09/12/2014   Procedure: CATARACT EXTRACTION PHACO AND INTRAOCULAR LENS PLACEMENT (IOC);  Surgeon: Estill Cotta, MD;  Location: [facility] ORS;  Service: Ophthalmology;  Laterality: Left;  [place]: 01:11.7 AP: 25.6 CDE: 29.48   . CATARACT EXTRACTION W/PHACO Right 10/17/2014   Procedure: CATARACT EXTRACTION PHACO AND INTRAOCULAR LENS PLACEMENT (IOC);  Surgeon: Estill Cotta, MD;  Location: [facility] ORS;  Service: Ophthalmology;  Laterality: Right;  [place] 01:47 AP%61.8 CDE49.41 fluid pack lot # [id number]  . EP IMPLANTABLE DEVICE N/A 11/17/2015   Procedure: ICD/BIV ICD Generator Changeout;  Surgeon: Deboraha Sprang, MD;  Location: [facility];  Service: Cardiovascular;  Laterality: N/A;  . ILEOSTOMY    . INSERT / REPLACE / REMOVE PACEMAKER    . PERIPHERAL VASCULAR CATHETERIZATION N/A 12/05/2014   Procedure: IVC Filter Insertion;  Surgeon: Algernon Huxley, MD;  Location: [facility];  Service: Cardiovascular;  Laterality: N/A;  . REVISION UROSTOMY CUTANEOUS    . TOTAL HIP ARTHROPLASTY      FAMILY HISTORY:  Reviewed and unchanged. No reported history of malignancy or chronic disease.     ADVANCED DIRECTIVES:    HEALTH MAINTENANCE: Social History  Substance Use Topics  . Smoking status: Never Smoker  . Smokeless tobacco: Never Used  . Alcohol use No  Colonoscopy:  PAP:  Bone density:  Lipid panel:  Allergies  Allergen Reactions  . Xarelto [Rivaroxaban] Other (See Comments)    Reaction:  Bleeding   . Azithromycin Other (See Comments)    Reaction:  Cold sweats   . Codeine Nausea And Vomiting  . Oxycodone-Acetaminophen Nausea And Vomiting    Current  Outpatient Prescriptions  Medication Sig Dispense Refill  . BREO ELLIPTA 200-25 MCG/INH AEPB Take 1 puff by mouth daily as needed (for shortness of breath).   1  . colchicine 0.6 MG tablet Take 0.6 mg by mouth daily.     . DimenhyDRINATE (DRAMAMINE PO) Take 1 tablet by mouth every morning.    . diphenhydrAMINE (BENADRYL) 25 mg capsule Take 25 mg by mouth every 6 (six) hours as needed for allergies.    . metoprolol (LOPRESSOR) 50 MG tablet Take 50 mg by mouth 2 (two) times daily.    . pantoprazole (PROTONIX) 40 MG tablet Take 40 mg by mouth 2 (two) times daily.  0  . sucralfate (CARAFATE) 1 g tablet Take 1 g by mouth daily as needed (for stomach).    . traZODone (DESYREL) 100 MG tablet Take 100 mg by mouth at bedtime.  4  . hydrALAZINE (APRESOLINE) 50 MG tablet Take 50 mg by mouth 2 (two) times daily.  4  . OVER THE COUNTER MEDICATION Place 1 drop into both eyes as needed (for dry eyes). OTC Lubricant Eye Drop     No current facility-administered medications for this visit.    Facility-Administered Medications Ordered in Other Visits  Medication Dose Route Frequency Provider Last Rate Last Dose  . sodium chloride 0.9 % injection 10 mL  10 mL Intravenous PRN Lloyd Huger, MD   10 mL at 10/26/14 1519    OBJECTIVE: Vitals:   05/16/16 1400  BP: (!) 182/93  Pulse: 90  Resp: 18  Temp: 98.1 F (36.7 C)     Body mass index is 24.71 kg/m.    ECOG FS:0 - Asymptomatic  General: Well-developed, well-nourished, no acute distress. Eyes: Pink conjunctiva, anicteric sclera. Lungs: Clear to auscultation bilaterally. Heart: Regular rate and rhythm. No rubs, murmurs, or gallops. Abdomen: Soft, nontender, nondistended. No organomegaly noted, normoactive bowel sounds. Urostomy bag noted draining clear yellow urine. Musculoskeletal: No edema, cyanosis, or clubbing. Neuro: Alert, answering all questions appropriately. Cranial nerves grossly intact. Skin: No rashes or petechiae noted. Psych:  Normal affect.   LAB RESULTS:  Lab Results  Component Value Date   NA 136 05/06/2016   K 4.2 05/06/2016   CL 101 05/06/2016   CO2 26 05/06/2016   GLUCOSE 131 (H) 05/06/2016   BUN 26 (H) 05/06/2016   CREATININE 1.81 (H) 05/06/2016   CALCIUM 10.4 (H) 05/06/2016   PROT 7.3 05/06/2016   ALBUMIN 4.2 05/06/2016   AST 23 05/06/2016   ALT 22 05/06/2016   ALKPHOS 55 05/06/2016   BILITOT 0.9 05/06/2016   GFRNONAA 33 (L) 05/06/2016   GFRAA 38 (L) 05/06/2016    Lab Results  Component Value Date   WBC 8.6 05/06/2016   NEUTROABS 5.2 05/06/2016   HGB 15.6 05/06/2016   HCT 45.8 05/06/2016   MCV 94.1 05/06/2016   PLT 130 (L) 05/06/2016   Lab Results  Component Value Date   PSA 0.02 06/06/2015     STUDIES: Ct Abdomen Pelvis Wo Contrast  Result Date: 05/14/2016 CLINICAL DATA:  Restaging bladder cancer. History of prostate cancer also. Status post cystoprostatectomy. EXAM: CT ABDOMEN AND PELVIS WITHOUT  CONTRAST TECHNIQUE: Multidetector CT imaging of the abdomen and pelvis was performed following the standard protocol without IV contrast. COMPARISON:  10/30/2015 FINDINGS: Lower chest: The lung bases are clear of acute process. No worrisome pulmonary lesions. There is a stable vague triangular density in the right middle lobe which is likely a lymph node. No pleural effusion. The heart is normal in size. No pericardial effusion. Pacer wires are stable. Moderate atherosclerotic calcifications involving the descending thoracic aorta. Scattered coronary artery calcifications. Hepatobiliary: No focal hepatic lesions or intrahepatic biliary dilatation. Cholelithiasis is again demonstrated. No common bile duct dilatation. Pancreas: No mass, inflammation or ductal dilatation. Spleen: Normal size.  No focal lesions. Adrenals/Urinary Tract: The adrenal glands are normal in stable. Lower pole left renal calculus but no obstructing ureteral calculi. Surgical changes from an ileal conduit and right lower  quadrant ileostomy. Stomach/Bowel: The stomach, duodenum, small bowel and colon are grossly normal without oral contrast. No inflammatory changes, mass lesions or obstructive findings. Vascular/Lymphatic: Advanced atherosclerotic calcifications involving the aorta and branch vessels. No aneurysm. An IVC filter is noted. Small scattered mesenteric and retroperitoneal lymph nodes but no mass or adenopathy. Reproductive: Surgically absent. Other: No pelvic mass or adenopathy. Extensive surgical changes in the pelvis. No inguinal mass or adenopathy. Musculoskeletal: No worrisome lytic or sclerotic bone lesions to suggest metastatic disease. Stable surgical changes involving the lumbar spine and stable degenerative changes. IMPRESSION: Stable surgical changes related to a cystoprostatectomy. Right lower quadrant ileal conduit and ileostomy without complicating features. Lower pole left renal calculus but no ureteral calculi. No findings for metastatic disease in the abdomen or pelvis. Stable advanced atherosclerotic calcifications involving the aorta and branch vessels. Stable cholelithiasis. Electronically Signed   By: Marijo Sanes M.D.   On: 05/14/2016 16:40    ASSESSMENT: Recurrent bladder cancer.  PLAN:    1. Recurrent bladder cancer: CT scan results from May 14, 2016 reviewed independently with no obvious evidence of recurrence. Return to clinic in 6 months with repeat laboratory work, imaging and further evaluation. Patient completed his chemotherapy with carboplatin and gemcitabine on May 09, 2014. Once patient is 3 years removed from his treatments, can switch to yearly imaging. Patient has refused any further urologic workup or follow-up. 2. Renal insufficiency: Patient's creatinine is approximately at his baseline, monitor. 3. Thrombocytopenia: Mild, monitor. 4. History of PET positive colon lesion:  Patient does not wish to undergo colonoscopy at this time. 5. Hypertension: Continue  monitoring her treatment per primary care.  Patient expressed understanding and was in agreement with this plan. He also understands that He can call clinic at any time with any questions, concerns, or complaints.   Bladder cancer   Staging form: Urinary Bladder, AJCC 7th Edition     Clinical stage from 11/06/2014: Stage IV (T4, N0, M1) - Signed by Lloyd Huger, MD on 11/06/2014   Lloyd Huger, MD   05/16/2016 2:09 PM

## 2016-05-16 ENCOUNTER — Inpatient Hospital Stay: Payer: Medicare HMO | Attending: Oncology | Admitting: Oncology

## 2016-05-16 VITALS — BP 182/93 | HR 90 | Temp 98.1°F | Resp 18 | Wt 177.1 lb

## 2016-05-16 DIAGNOSIS — Z8546 Personal history of malignant neoplasm of prostate: Secondary | ICD-10-CM | POA: Insufficient documentation

## 2016-05-16 DIAGNOSIS — I10 Essential (primary) hypertension: Secondary | ICD-10-CM | POA: Insufficient documentation

## 2016-05-16 DIAGNOSIS — Z950 Presence of cardiac pacemaker: Secondary | ICD-10-CM | POA: Insufficient documentation

## 2016-05-16 DIAGNOSIS — E119 Type 2 diabetes mellitus without complications: Secondary | ICD-10-CM | POA: Diagnosis not present

## 2016-05-16 DIAGNOSIS — K219 Gastro-esophageal reflux disease without esophagitis: Secondary | ICD-10-CM | POA: Diagnosis not present

## 2016-05-16 DIAGNOSIS — M1990 Unspecified osteoarthritis, unspecified site: Secondary | ICD-10-CM | POA: Insufficient documentation

## 2016-05-16 DIAGNOSIS — C679 Malignant neoplasm of bladder, unspecified: Secondary | ICD-10-CM | POA: Diagnosis not present

## 2016-05-16 DIAGNOSIS — I2510 Atherosclerotic heart disease of native coronary artery without angina pectoris: Secondary | ICD-10-CM | POA: Diagnosis not present

## 2016-05-16 DIAGNOSIS — I252 Old myocardial infarction: Secondary | ICD-10-CM | POA: Insufficient documentation

## 2016-05-16 DIAGNOSIS — Z79899 Other long term (current) drug therapy: Secondary | ICD-10-CM | POA: Diagnosis not present

## 2016-05-16 NOTE — Progress Notes (Signed)
Patient is here for follow up, he is doing well. He mentions his legs bother him but when he is up and moving it makes it better.

## 2016-05-20 ENCOUNTER — Ambulatory Visit: Payer: Medicare HMO | Admitting: Oncology

## 2016-05-31 ENCOUNTER — Encounter: Payer: Self-pay | Admitting: Cardiology

## 2016-06-17 ENCOUNTER — Other Ambulatory Visit: Payer: Self-pay

## 2016-06-17 ENCOUNTER — Encounter: Payer: Self-pay | Admitting: Emergency Medicine

## 2016-06-17 ENCOUNTER — Emergency Department: Payer: Medicare HMO

## 2016-06-17 ENCOUNTER — Emergency Department
Admission: EM | Admit: 2016-06-17 | Discharge: 2016-06-17 | Disposition: A | Discharge status: Home / Self Care | Payer: Medicare HMO | Attending: Emergency Medicine | Admitting: Emergency Medicine

## 2016-06-17 DIAGNOSIS — I2510 Atherosclerotic heart disease of native coronary artery without angina pectoris: Secondary | ICD-10-CM | POA: Insufficient documentation

## 2016-06-17 DIAGNOSIS — I5042 Chronic combined systolic (congestive) and diastolic (congestive) heart failure: Secondary | ICD-10-CM | POA: Insufficient documentation

## 2016-06-17 DIAGNOSIS — Z8551 Personal history of malignant neoplasm of bladder: Secondary | ICD-10-CM | POA: Insufficient documentation

## 2016-06-17 DIAGNOSIS — Z79899 Other long term (current) drug therapy: Secondary | ICD-10-CM | POA: Insufficient documentation

## 2016-06-17 DIAGNOSIS — I130 Hypertensive heart and chronic kidney disease with heart failure and stage 1 through stage 4 chronic kidney disease, or unspecified chronic kidney disease: Secondary | ICD-10-CM | POA: Insufficient documentation

## 2016-06-17 DIAGNOSIS — N183 Chronic kidney disease, stage 3 (moderate): Secondary | ICD-10-CM | POA: Insufficient documentation

## 2016-06-17 DIAGNOSIS — G301 Alzheimer's disease with late onset: Secondary | ICD-10-CM

## 2016-06-17 DIAGNOSIS — F0390 Unspecified dementia without behavioral disturbance: Secondary | ICD-10-CM

## 2016-06-17 DIAGNOSIS — F0281 Dementia in other diseases classified elsewhere with behavioral disturbance: Secondary | ICD-10-CM

## 2016-06-17 DIAGNOSIS — R000 Tachycardia, unspecified: Secondary | ICD-10-CM

## 2016-06-17 DIAGNOSIS — R45850 Homicidal ideations: Secondary | ICD-10-CM | POA: Diagnosis not present

## 2016-06-17 DIAGNOSIS — R258 Other abnormal involuntary movements: Secondary | ICD-10-CM | POA: Diagnosis present

## 2016-06-17 LAB — COMPREHENSIVE METABOLIC PANEL
ALK PHOS: 61 U/L (ref 38–126)
ALT: 28 U/L (ref 17–63)
ANION GAP: 10 (ref 5–15)
AST: 32 U/L (ref 15–41)
Albumin: 4.7 g/dL (ref 3.5–5.0)
BILIRUBIN TOTAL: 0.7 mg/dL (ref 0.3–1.2)
BUN: 39 mg/dL — ABNORMAL HIGH (ref 6–20)
CALCIUM: 10.4 mg/dL — AB (ref 8.9–10.3)
CO2: 26 mmol/L (ref 22–32)
CREATININE: 2.05 mg/dL — AB (ref 0.61–1.24)
Chloride: 99 mmol/L — ABNORMAL LOW (ref 101–111)
GFR, EST AFRICAN AMERICAN: 33 mL/min — AB (ref >60)
GFR, EST NON AFRICAN AMERICAN: 29 mL/min — AB (ref >60)
Glucose, Bld: 164 mg/dL — ABNORMAL HIGH (ref 65–99)
Potassium: 4.5 mmol/L (ref 3.5–5.1)
Sodium: 135 mmol/L (ref 135–145)
TOTAL PROTEIN: 8.1 g/dL (ref 6.5–8.1)

## 2016-06-17 LAB — ETHANOL

## 2016-06-17 LAB — CBC
HCT: 51.6 % (ref 40.0–52.0)
Hemoglobin: 17.5 g/dL (ref 13.0–18.0)
MCH: 32.7 pg (ref 26.0–34.0)
MCHC: 33.9 g/dL (ref 32.0–36.0)
MCV: 96.6 fL (ref 80.0–100.0)
PLATELETS: 162 10*3/uL (ref 150–440)
RBC: 5.34 MIL/uL (ref 4.40–5.90)
RDW: 14 % (ref 11.5–14.5)
WBC: 13.4 10*3/uL — AB (ref 3.8–10.6)

## 2016-06-17 LAB — URINE DRUG SCREEN, QUALITATIVE (ARMC ONLY)
Amphetamines, Ur Screen: NOT DETECTED
BARBITURATES, UR SCREEN: NOT DETECTED
Benzodiazepine, Ur Scrn: NOT DETECTED
CANNABINOID 50 NG, UR ~~LOC~~: NOT DETECTED
Cocaine Metabolite,Ur ~~LOC~~: NOT DETECTED
MDMA (ECSTASY) UR SCREEN: NOT DETECTED
METHADONE SCREEN, URINE: NOT DETECTED
Opiate, Ur Screen: NOT DETECTED
Phencyclidine (PCP) Ur S: NOT DETECTED
TRICYCLIC, UR SCREEN: NOT DETECTED

## 2016-06-17 LAB — DIFFERENTIAL
BASOS ABS: 0.1 10*3/uL (ref 0–0.1)
Basophils Relative: 1 %
EOS PCT: 2 %
Eosinophils Absolute: 0.2 10*3/uL (ref 0–0.7)
LYMPHS PCT: 12 %
Lymphs Abs: 1.6 10*3/uL (ref 1.0–3.6)
Monocytes Absolute: 0.7 10*3/uL (ref 0.2–1.0)
Monocytes Relative: 6 %
NEUTROS PCT: 79 %
Neutro Abs: 10.7 10*3/uL — ABNORMAL HIGH (ref 1.4–6.5)

## 2016-06-17 MED ORDER — METOPROLOL TARTRATE 50 MG PO TABS
50.0000 mg | ORAL_TABLET | Freq: Once | ORAL | Status: AC
  Administered 2016-06-17: 50 mg via ORAL

## 2016-06-17 MED ORDER — METOPROLOL TARTRATE 50 MG PO TABS
ORAL_TABLET | ORAL | Status: AC
  Administered 2016-06-17: 50 mg via ORAL
  Filled 2016-06-17: qty 1

## 2016-06-17 MED ORDER — METOPROLOL TARTRATE 5 MG/5ML IV SOLN
5.0000 mg | Freq: Once | INTRAVENOUS | Status: DC
  Filled 2016-06-17: qty 5

## 2016-06-17 NOTE — ED Notes (Signed)
Emptied pt urine bag.

## 2016-06-17 NOTE — ED Notes (Signed)
At 1435 pt was standing in doorway and clutched chest and sat on ground. Pt did not fall but reported he could not stand. Staff at pts side to help him sit down. Pt reported he could not stand and his heart was beating too fast. Pt reported he did not have his medication today and verbalized "I told you multiple times I needed this medication." This was the first time this RN heard of medications needed before discharge. When asked pt reported he had told the police officer and not the nurse or the doctor. Pt verbalized he takes a little pink pill but is unable to name the medication exactly. MD left bedside after EKG to validate medication. RN and staff assisted pt to bed and performed EKG and applied cardiac and oxygen monitoring. BP cuff applied and pt instructed not to leave the bed. Pt refused IV metoprolol , IV, troponin and chest Xray and verbalized "all I need is the pill and I can go home" Pt is currently sitting in bed in NAD. PT able to ambulate again and has to be reminded multiple times to remain in bed. Pt on monitor and reporting feeling "better" after receiving pill.

## 2016-06-17 NOTE — ED Notes (Signed)
EDMD at bedside
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
## 2016-06-17 NOTE — ED Notes (Signed)
Refused food tray.

## 2016-06-17 NOTE — ED Notes (Signed)
CHuck Jeneen Rinks [phone] has taken pt clothes
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
## 2016-06-17 NOTE — ED Notes (Signed)
Gave pt a sprite. 

## 2016-06-17 NOTE — ED Notes (Signed)
Attempted to call family for pt. Family did not answer phone and mailbox was full so no message could be left. Pt requesting someone to check on dog.

## 2016-06-17 NOTE — ED Notes (Signed)
IVC  PAPERS  RESCINDED  PER  DR  CLAPACS  INFORMED  RN  [facility]  AND  DR Clearnce Hasten  MD

## 2016-06-17 NOTE — Consult Note (Signed)
[facility] Consult   Reason for Consult:  Consult for 83 year old man without significant past psychiatric history brought in after making threats Referring Physician:  [place] Patient Identification: BENNEY [place] MRN:  [id number] Principal Diagnosis: Dementia Diagnosis:   Patient Active Problem List   Diagnosis Date Noted  . Dementia [F03.90] 06/17/2016  . Acute cholecystitis [K81.0]   . Dyspnea [R06.00] 01/11/2015  . UTI (urinary tract infection) [N39.0] 01/09/2015  . Elevated troponin [R74.8] 01/09/2015  . Chronic combined systolic and diastolic CHF (congestive heart failure) ([place]) [I50.42] 01/09/2015  . Type 2 diabetes mellitus ([place]) [E11.9] 01/09/2015  . CAD (coronary artery disease) [I25.10] 01/09/2015  . HTN (hypertension) [I10] 01/09/2015  . CKD (chronic kidney disease), stage III [N18.3] 01/09/2015  . Bladder cancer ([place]) [C67.9] 11/06/2014  . Automatic implantable cardioverter-defibrillator in situ [Z95.810] 10/02/2012  . Diabetes mellitus ([place]) [E11.9] 10/02/2012  . Acid reflux [K21.9] 10/02/2012  . Arthritis, degenerative [M19.90] 10/02/2012    Total Time spent with patient: 1 hour  Subjective:   MEGHAN TIEMANN is a 83 y.o. male patient admitted with "I never said I was going to shoot anybody".  HPI:  Patient interviewed. Chart reviewed. A 74-year-old man brought it on voluntary papers with reports that he had made threats to shoot his neighbor. Patient says that his neighbors have been parking illegally on his street for a long time and doing other things that irritate him. He had called the police to report this today. When the police came by EEG made a comment that he was so angry that he felt like shooting his neighbors. Patient did not have a firearm and denies that he actually had any serious thought or intention of shooting anyone. Says that his mood overall is fine. Doesn't feel depressed. Sleeps adequately with his current medication. He  eats okay. Patient denies suicidal or homicidal thought. He denies any awareness of memory memory loss as well. Does not drink or abuse any drugs.  Social history: Lives by himself. Closest relative apparently is a nephew. Retired Armed forces logistics/support/administrative officer.  Medical history: As heart disease has an implantable defibrillator multiple medical problems  Substance abuse history: Says he has never drunk alcohol or smoked in his entire life  Past Psychiatric History: Patient has no past psychiatric history. Never been in a psychiatric hospital. No history of suicide attempts. No history of depression. Never been evaluated or treated for dementia in the past.  Risk to Self: Is patient at risk for suicide?: No Risk to Others:   Prior Inpatient Therapy:   Prior Outpatient Therapy:    Past Medical History:  Past Medical History:  Diagnosis Date  . AICD (automatic cardioverter/defibrillator) present   . Arthritis   . Bladder cancer ([place])   . CAD (coronary artery disease)   . Cardiomyopathy ([place])   . Chronic combined systolic and diastolic CHF (congestive heart failure) ([place])   . Chronic renal insufficiency   . COPD (chronic obstructive pulmonary disease) ([place])   . Diabetes mellitus without complication ([place])   . Dysrhythmia   . GERD (gastroesophageal reflux disease)   . Gout   . History of permanent cardiac pacemaker placement   . Hypertension   . Myocardial infarction   . Presence of permanent cardiac pacemaker   . Prostate cancer [facility])     Past Surgical History:  Procedure Laterality Date  . APPENDECTOMY    . BLADDER REMOVAL    . CARDIAC CATHETERIZATION    . CATARACT EXTRACTION W/PHACO Left  09/12/2014   Procedure: CATARACT EXTRACTION PHACO AND INTRAOCULAR LENS PLACEMENT (IOC);  Surgeon: Estill Cotta, MD;  Location: [facility] ORS;  Service: Ophthalmology;  Laterality: Left;  [place]: 01:11.7 AP: 25.6 CDE: 29.48   . CATARACT EXTRACTION W/PHACO Right 10/17/2014   Procedure: CATARACT EXTRACTION PHACO AND  INTRAOCULAR LENS PLACEMENT (IOC);  Surgeon: Estill Cotta, MD;  Location: [facility] ORS;  Service: Ophthalmology;  Laterality: Right;  [place] 01:47 AP%61.8 CDE49.41 fluid pack lot # [id number]  . EP IMPLANTABLE DEVICE N/A 11/17/2015   Procedure: ICD/BIV ICD Generator Changeout;  Surgeon: Deboraha Sprang, MD;  Location: [facility] [place] [facility];  Service: Cardiovascular;  Laterality: N/A;  . ILEOSTOMY    . INSERT / REPLACE / REMOVE PACEMAKER    . PERIPHERAL VASCULAR CATHETERIZATION N/A 12/05/2014   Procedure: IVC Filter Insertion;  Surgeon: Algernon Huxley, MD;  Location: [place] [facility];  Service: Cardiovascular;  Laterality: N/A;  . REVISION UROSTOMY CUTANEOUS    . TOTAL HIP ARTHROPLASTY     Family History:  Family History  Problem Relation Age of Onset  . Stroke Father    Family Psychiatric  History: Does not know of any family history Social History:  History  Alcohol Use No     History  Drug Use No    Social History   Social History  . Marital status: Divorced    Spouse name: N/A  . Number of children: N/A  . Years of education: N/A   Social History Main Topics  . Smoking status: Never Smoker  . Smokeless tobacco: Never Used  . Alcohol use No  . Drug use: No  . Sexual activity: Yes   Other Topics Concern  . None   Social History Narrative  . None   Additional Social History:    Allergies:   Allergies  Allergen Reactions  . Xarelto [Rivaroxaban] Other (See Comments)    Reaction:  Bleeding   . Azithromycin Other (See Comments)    Reaction:  Cold sweats   . Codeine Nausea And Vomiting  . Oxycodone-Acetaminophen Nausea And Vomiting    Labs:  Results for orders placed or performed during the hospital encounter of 06/17/16 (from the past 48 hour(s))  Comprehensive metabolic panel     Status: Abnormal   Collection Time: 06/17/16  4:25 PM  Result Value Ref Range   Sodium 135 135 - 145 mmol/L   Potassium 4.5 3.5 - 5.1 mmol/L   Chloride 99 (L) 101 - 111 mmol/L   CO2 26  22 - 32 mmol/L   Glucose, Bld 164 (H) 65 - 99 mg/dL   BUN 39 (H) 6 - 20 mg/dL   Creatinine, Ser 2.05 (H) 0.61 - 1.24 mg/dL   Calcium 10.4 (H) 8.9 - 10.3 mg/dL   Total Protein 8.1 6.5 - 8.1 g/dL   Albumin 4.7 3.5 - 5.0 g/dL   AST 32 15 - 41 U/L   ALT 28 17 - 63 U/L   Alkaline Phosphatase 61 38 - 126 U/L   Total Bilirubin 0.7 0.3 - 1.2 mg/dL   GFR calc non Af Amer 29 (L) >60 mL/min   GFR calc Af Amer 33 (L) >60 mL/min    Comment: (NOTE) The eGFR has been calculated using the CKD EPI equation. This calculation has not been validated in all clinical situations. eGFR's persistently <60 mL/min signify possible Chronic Kidney Disease.    Anion gap 10 5 - 15  CBC     Status: Abnormal   Collection Time: 06/17/16  4:25 PM  Result Value Ref Range   WBC 13.4 (H) 3.8 - 10.6 K/uL   RBC 5.34 4.40 - 5.90 MIL/uL   Hemoglobin 17.5 13.0 - 18.0 g/dL   HCT 51.6 40.0 - 52.0 %   MCV 96.6 80.0 - 100.0 fL   MCH 32.7 26.0 - 34.0 pg   MCHC 33.9 32.0 - 36.0 g/dL   RDW 14.0 11.5 - 14.5 %   Platelets 162 150 - 440 K/uL  Differential     Status: Abnormal   Collection Time: 06/17/16  4:25 PM  Result Value Ref Range   Neutrophils Relative % 79 %   Neutro Abs 10.7 (H) 1.4 - 6.5 K/uL   Lymphocytes Relative 12 %   Lymphs Abs 1.6 1.0 - 3.6 K/uL   Monocytes Relative 6 %   Monocytes Absolute 0.7 0.2 - 1.0 K/uL   Eosinophils Relative 2 %   Eosinophils Absolute 0.2 0 - 0.7 K/uL   Basophils Relative 1 %   Basophils Absolute 0.1 0 - 0.1 K/uL  Urine Drug Screen, Qualitative ([facility] only)     Status: None   Collection Time: 06/17/16  5:13 PM  Result Value Ref Range   Tricyclic, Ur Screen NONE DETECTED NONE DETECTED   Amphetamines, Ur Screen NONE DETECTED NONE DETECTED   MDMA (Ecstasy)Ur Screen NONE DETECTED NONE DETECTED   Cocaine Metabolite,Ur [place] NONE DETECTED NONE DETECTED   Opiate, Ur Screen NONE DETECTED NONE DETECTED   Phencyclidine (PCP) Ur S NONE DETECTED NONE DETECTED   Cannabinoid 50 Ng, Ur [place] NONE  DETECTED NONE DETECTED   Barbiturates, Ur Screen NONE DETECTED NONE DETECTED   Benzodiazepine, Ur Scrn NONE DETECTED NONE DETECTED   Methadone Scn, Ur NONE DETECTED NONE DETECTED    Comment: (NOTE) 094  Tricyclics, urine               Cutoff 1000 ng/mL 200  Amphetamines, urine             Cutoff 1000 ng/mL 300  MDMA (Ecstasy), urine           Cutoff 500 ng/mL 400  Cocaine Metabolite, urine       Cutoff 300 ng/mL 500  Opiate, urine                   Cutoff 300 ng/mL 600  Phencyclidine (PCP), urine      Cutoff 25 ng/mL 700  Cannabinoid, urine              Cutoff 50 ng/mL 800  Barbiturates, urine             Cutoff 200 ng/mL 900  Benzodiazepine, urine           Cutoff 200 ng/mL 1000 Methadone, urine                Cutoff 300 ng/mL 1100 1200 The urine drug screen provides only a preliminary, unconfirmed 1300 analytical test result and should not be used for non-medical 1400 purposes. Clinical consideration and professional judgment should 1500 be applied to any positive drug screen result due to possible 1600 interfering substances. A more specific alternate chemical method 1700 must be used in order to obtain a confirmed analytical result.  1800 Gas chromato graphy / mass spectrometry (GC/MS) is the preferred 1900 confirmatory method.   Ethanol     Status: None   Collection Time: 06/17/16  5:14 PM  Result Value Ref Range   Alcohol, Ethyl (B) <5 <5 mg/dL  Comment:        LOWEST DETECTABLE LIMIT FOR SERUM ALCOHOL IS 5 mg/dL FOR MEDICAL PURPOSES ONLY     No current facility-administered medications for this encounter.    Current Outpatient Prescriptions  Medication Sig Dispense Refill  . BREO ELLIPTA 200-25 MCG/INH AEPB Take 1 puff by mouth daily as needed (for shortness of breath).   1  . colchicine 0.6 MG tablet Take 0.6 mg by mouth daily.     . DimenhyDRINATE (DRAMAMINE PO) Take 1 tablet by mouth every morning.    . diphenhydrAMINE (BENADRYL) 25 mg capsule Take 25 mg by  mouth every 6 (six) hours as needed for allergies.    . furosemide (LASIX) 20 MG tablet Take 40 mg by mouth daily.    . hydrALAZINE (APRESOLINE) 50 MG tablet Take 50 mg by mouth 2 (two) times daily.  4  . metoprolol (LOPRESSOR) 50 MG tablet Take 50 mg by mouth 2 (two) times daily.    Marland Kitchen OVER THE COUNTER MEDICATION Place 1 drop into both eyes as needed (for dry eyes). OTC Lubricant Eye Drop    . pantoprazole (PROTONIX) 40 MG tablet Take 40 mg by mouth 2 (two) times daily.  0  . sucralfate (CARAFATE) 1 g tablet Take 1 g by mouth daily as needed (for stomach).    . traZODone (DESYREL) 100 MG tablet Take 100 mg by mouth at bedtime.  4   Facility-Administered Medications Ordered in Other Encounters  Medication Dose Route Frequency Provider Last Rate Last Dose  . sodium chloride 0.9 % injection 10 mL  10 mL Intravenous PRN Lloyd Huger, MD   10 mL at 10/26/14 1519    Musculoskeletal: Strength & Muscle Tone: within normal limits Gait & Station: normal Patient leans: N/A  Psychiatric Specialty Exam: Physical Exam  Nursing note and vitals reviewed. Constitutional: He appears well-developed and well-nourished.  HENT:  Head: Normocephalic and atraumatic.  Eyes: Conjunctivae are normal. Pupils are equal, round, and reactive to light.  Neck: Normal range of motion.  Cardiovascular: Regular rhythm and normal heart sounds.   Respiratory: Effort normal. No respiratory distress.  GI: Soft.  Musculoskeletal: Normal range of motion.  Neurological: He is alert.  Skin: Skin is warm and dry.  Psychiatric: His affect is angry. His speech is tangential. Cognition and memory are impaired. He expresses impulsivity. He expresses no homicidal and no suicidal ideation. He exhibits abnormal recent memory. He is inattentive.    Review of Systems  Constitutional: Negative.   HENT: Negative.   Eyes: Negative.   Respiratory: Negative.   Cardiovascular: Negative.   Gastrointestinal: Negative.    Musculoskeletal: Negative.   Skin: Negative.   Neurological: Negative.   Psychiatric/Behavioral: Negative for depression, hallucinations, memory loss, substance abuse and suicidal ideas. The patient is not nervous/anxious and does not have insomnia.     Blood pressure (!) 144/108, pulse (!) 118, temperature 98.1 F (36.7 C), temperature source Oral, resp. rate 16, height _0  (1.803 m), weight 77.1 kg (170 lb), SpO2 98 %.Body mass index is 23.71 kg/m.  General Appearance: Disheveled  Eye Contact:  Fair  Speech:  Garbled  Volume:  Decreased  Mood:  Irritable  Affect:  Congruent  Thought Process:  Disorganized  Orientation:  Full (Time, Place, and Person)  Thought Content:  Rumination and Tangential  Suicidal Thoughts:  No  Homicidal Thoughts:  No  Memory:  Immediate;   Fair Recent;   Fair Remote;   Fair  Judgement:  Fair  Insight:  Fair  Psychomotor Activity:  Decreased  Concentration:  Concentration: Fair  Recall:  [employer] of Knowledge:  Fair  Language:  Fair  Akathisia:  No  Handed:  Right  AIMS (if indicated):     Assets:  Financial Resources/Insurance Housing Resilience  ADL's:  Intact  Cognition:  Impaired,  Mild  Sleep:        Treatment Plan Summary: Plan Patient was not psychotic and not exactly confused but he was not correct about the year and he was not able to remember any of 3 words at just a couple of minutes. He was oriented to the situation however. No sign of delirium. Patient repeatedly and clearly says he had no intention or thought of hurting anyone. Clearly understands what the consequences of that would be. I don't think this gentleman is acutely dangerous. Try to give him some counseling about the importance of not speaking impulsively especially to Administrator. Does not meet commitment criteria. Discontinue IVC and he can be released home.  Disposition: Patient does not meet criteria for psychiatric inpatient admission. Supportive  therapy provided about ongoing stressors.  Alethia Berthold, MD 06/17/2016 6:28 PM

## 2016-06-17 NOTE — ED Notes (Signed)
PT IVC PENDING CONSULT  

## 2016-06-25 DIAGNOSIS — C801 Malignant (primary) neoplasm, unspecified: Secondary | ICD-10-CM | POA: Diagnosis not present

## 2016-06-25 DIAGNOSIS — I739 Peripheral vascular disease, unspecified: Secondary | ICD-10-CM | POA: Diagnosis not present

## 2016-06-25 DIAGNOSIS — Z95810 Presence of automatic (implantable) cardiac defibrillator: Secondary | ICD-10-CM | POA: Diagnosis not present

## 2016-06-25 DIAGNOSIS — H8302 Labyrinthitis, left ear: Secondary | ICD-10-CM | POA: Diagnosis not present

## 2016-07-01 DIAGNOSIS — G620 Drug-induced polyneuropathy: Secondary | ICD-10-CM | POA: Diagnosis not present

## 2016-07-01 DIAGNOSIS — E119 Type 2 diabetes mellitus without complications: Secondary | ICD-10-CM | POA: Diagnosis not present

## 2016-07-01 DIAGNOSIS — I739 Peripheral vascular disease, unspecified: Secondary | ICD-10-CM | POA: Diagnosis not present

## 2016-07-01 DIAGNOSIS — Z95810 Presence of automatic (implantable) cardiac defibrillator: Secondary | ICD-10-CM | POA: Diagnosis not present

## 2016-07-04 DIAGNOSIS — E119 Type 2 diabetes mellitus without complications: Secondary | ICD-10-CM | POA: Diagnosis not present

## 2016-07-04 DIAGNOSIS — G620 Drug-induced polyneuropathy: Secondary | ICD-10-CM | POA: Diagnosis not present

## 2016-07-04 DIAGNOSIS — C801 Malignant (primary) neoplasm, unspecified: Secondary | ICD-10-CM | POA: Diagnosis not present

## 2016-07-04 DIAGNOSIS — I739 Peripheral vascular disease, unspecified: Secondary | ICD-10-CM | POA: Diagnosis not present

## 2016-07-17 DIAGNOSIS — H60331 Swimmer's ear, right ear: Secondary | ICD-10-CM | POA: Diagnosis not present

## 2016-07-17 DIAGNOSIS — H903 Sensorineural hearing loss, bilateral: Secondary | ICD-10-CM | POA: Diagnosis not present

## 2016-07-17 DIAGNOSIS — H9209 Otalgia, unspecified ear: Secondary | ICD-10-CM | POA: Diagnosis not present

## 2016-07-26 DIAGNOSIS — Z936 Other artificial openings of urinary tract status: Secondary | ICD-10-CM | POA: Diagnosis not present

## 2016-07-26 DIAGNOSIS — Z436 Encounter for attention to other artificial openings of urinary tract: Secondary | ICD-10-CM | POA: Diagnosis not present

## 2016-08-02 DIAGNOSIS — T451X5A Adverse effect of antineoplastic and immunosuppressive drugs, initial encounter: Secondary | ICD-10-CM | POA: Diagnosis not present

## 2016-08-02 DIAGNOSIS — G620 Drug-induced polyneuropathy: Secondary | ICD-10-CM | POA: Diagnosis not present

## 2016-08-02 DIAGNOSIS — C801 Malignant (primary) neoplasm, unspecified: Secondary | ICD-10-CM | POA: Diagnosis not present

## 2016-08-02 DIAGNOSIS — I509 Heart failure, unspecified: Secondary | ICD-10-CM | POA: Diagnosis not present

## 2016-08-08 DIAGNOSIS — E119 Type 2 diabetes mellitus without complications: Secondary | ICD-10-CM | POA: Diagnosis not present

## 2016-08-08 DIAGNOSIS — C801 Malignant (primary) neoplasm, unspecified: Secondary | ICD-10-CM | POA: Diagnosis not present

## 2016-08-08 DIAGNOSIS — I509 Heart failure, unspecified: Secondary | ICD-10-CM | POA: Diagnosis not present

## 2016-08-08 DIAGNOSIS — G620 Drug-induced polyneuropathy: Secondary | ICD-10-CM | POA: Diagnosis not present

## 2016-08-08 DIAGNOSIS — Z95810 Presence of automatic (implantable) cardiac defibrillator: Secondary | ICD-10-CM | POA: Diagnosis not present

## 2016-09-05 ENCOUNTER — Emergency Department
Admission: EM | Admit: 2016-09-05 | Discharge: 2016-09-05 | Disposition: A | Discharge status: Home / Self Care | Payer: Medicare HMO | Source: Home / Self Care | Attending: Emergency Medicine | Admitting: Emergency Medicine

## 2016-09-05 ENCOUNTER — Encounter: Payer: Self-pay | Admitting: Emergency Medicine

## 2016-09-05 ENCOUNTER — Emergency Department: Payer: Medicare HMO

## 2016-09-05 DIAGNOSIS — N183 Chronic kidney disease, stage 3 (moderate): Secondary | ICD-10-CM | POA: Diagnosis present

## 2016-09-05 DIAGNOSIS — Z8551 Personal history of malignant neoplasm of bladder: Secondary | ICD-10-CM

## 2016-09-05 DIAGNOSIS — Z888 Allergy status to other drugs, medicaments and biological substances status: Secondary | ICD-10-CM | POA: Diagnosis not present

## 2016-09-05 DIAGNOSIS — Z8042 Family history of malignant neoplasm of prostate: Secondary | ICD-10-CM | POA: Diagnosis not present

## 2016-09-05 DIAGNOSIS — N179 Acute kidney failure, unspecified: Secondary | ICD-10-CM | POA: Diagnosis present

## 2016-09-05 DIAGNOSIS — Z881 Allergy status to other antibiotic agents status: Secondary | ICD-10-CM

## 2016-09-05 DIAGNOSIS — E871 Hypo-osmolality and hyponatremia: Secondary | ICD-10-CM | POA: Diagnosis present

## 2016-09-05 DIAGNOSIS — Z79899 Other long term (current) drug therapy: Secondary | ICD-10-CM

## 2016-09-05 DIAGNOSIS — I5042 Chronic combined systolic (congestive) and diastolic (congestive) heart failure: Secondary | ICD-10-CM | POA: Diagnosis present

## 2016-09-05 DIAGNOSIS — I472 Ventricular tachycardia: Secondary | ICD-10-CM | POA: Diagnosis present

## 2016-09-05 DIAGNOSIS — Z9841 Cataract extraction status, right eye: Secondary | ICD-10-CM

## 2016-09-05 DIAGNOSIS — I129 Hypertensive chronic kidney disease with stage 1 through stage 4 chronic kidney disease, or unspecified chronic kidney disease: Secondary | ICD-10-CM | POA: Diagnosis not present

## 2016-09-05 DIAGNOSIS — Z8052 Family history of malignant neoplasm of bladder: Secondary | ICD-10-CM | POA: Diagnosis not present

## 2016-09-05 DIAGNOSIS — E872 Acidosis: Secondary | ICD-10-CM | POA: Diagnosis present

## 2016-09-05 DIAGNOSIS — M109 Gout, unspecified: Secondary | ICD-10-CM | POA: Diagnosis present

## 2016-09-05 DIAGNOSIS — M791 Myalgia: Secondary | ICD-10-CM | POA: Diagnosis not present

## 2016-09-05 DIAGNOSIS — I252 Old myocardial infarction: Secondary | ICD-10-CM | POA: Diagnosis not present

## 2016-09-05 DIAGNOSIS — Z4682 Encounter for fitting and adjustment of non-vascular catheter: Secondary | ICD-10-CM | POA: Diagnosis not present

## 2016-09-05 DIAGNOSIS — I2699 Other pulmonary embolism without acute cor pulmonale: Secondary | ICD-10-CM | POA: Diagnosis present

## 2016-09-05 DIAGNOSIS — R748 Abnormal levels of other serum enzymes: Secondary | ICD-10-CM | POA: Diagnosis not present

## 2016-09-05 DIAGNOSIS — Z885 Allergy status to narcotic agent status: Secondary | ICD-10-CM | POA: Diagnosis not present

## 2016-09-05 DIAGNOSIS — E1122 Type 2 diabetes mellitus with diabetic chronic kidney disease: Secondary | ICD-10-CM | POA: Diagnosis present

## 2016-09-05 DIAGNOSIS — K8020 Calculus of gallbladder without cholecystitis without obstruction: Secondary | ICD-10-CM | POA: Diagnosis not present

## 2016-09-05 DIAGNOSIS — R531 Weakness: Secondary | ICD-10-CM | POA: Diagnosis not present

## 2016-09-05 DIAGNOSIS — R579 Shock, unspecified: Secondary | ICD-10-CM | POA: Diagnosis present

## 2016-09-05 DIAGNOSIS — Z95810 Presence of automatic (implantable) cardiac defibrillator: Secondary | ICD-10-CM

## 2016-09-05 DIAGNOSIS — R252 Cramp and spasm: Secondary | ICD-10-CM | POA: Insufficient documentation

## 2016-09-05 DIAGNOSIS — R778 Other specified abnormalities of plasma proteins: Secondary | ICD-10-CM | POA: Diagnosis present

## 2016-09-05 DIAGNOSIS — E119 Type 2 diabetes mellitus without complications: Secondary | ICD-10-CM | POA: Diagnosis not present

## 2016-09-05 DIAGNOSIS — I2510 Atherosclerotic heart disease of native coronary artery without angina pectoris: Secondary | ICD-10-CM | POA: Diagnosis present

## 2016-09-05 DIAGNOSIS — N189 Chronic kidney disease, unspecified: Secondary | ICD-10-CM

## 2016-09-05 DIAGNOSIS — I130 Hypertensive heart and chronic kidney disease with heart failure and stage 1 through stage 4 chronic kidney disease, or unspecified chronic kidney disease: Secondary | ICD-10-CM | POA: Diagnosis present

## 2016-09-05 DIAGNOSIS — R9431 Abnormal electrocardiogram [ECG] [EKG]: Secondary | ICD-10-CM | POA: Diagnosis not present

## 2016-09-05 DIAGNOSIS — I509 Heart failure, unspecified: Secondary | ICD-10-CM | POA: Diagnosis not present

## 2016-09-05 DIAGNOSIS — A419 Sepsis, unspecified organism: Secondary | ICD-10-CM | POA: Diagnosis present

## 2016-09-05 DIAGNOSIS — K219 Gastro-esophageal reflux disease without esophagitis: Secondary | ICD-10-CM | POA: Diagnosis present

## 2016-09-05 DIAGNOSIS — M25559 Pain in unspecified hip: Secondary | ICD-10-CM | POA: Diagnosis not present

## 2016-09-05 DIAGNOSIS — C679 Malignant neoplasm of bladder, unspecified: Secondary | ICD-10-CM | POA: Insufficient documentation

## 2016-09-05 DIAGNOSIS — Z9842 Cataract extraction status, left eye: Secondary | ICD-10-CM

## 2016-09-05 DIAGNOSIS — E875 Hyperkalemia: Secondary | ICD-10-CM | POA: Diagnosis present

## 2016-09-05 DIAGNOSIS — Z66 Do not resuscitate: Secondary | ICD-10-CM | POA: Diagnosis present

## 2016-09-05 DIAGNOSIS — Z8546 Personal history of malignant neoplasm of prostate: Secondary | ICD-10-CM | POA: Insufficient documentation

## 2016-09-05 DIAGNOSIS — Z961 Presence of intraocular lens: Secondary | ICD-10-CM | POA: Diagnosis present

## 2016-09-05 DIAGNOSIS — M79605 Pain in left leg: Secondary | ICD-10-CM | POA: Diagnosis not present

## 2016-09-05 DIAGNOSIS — M79606 Pain in leg, unspecified: Secondary | ICD-10-CM | POA: Diagnosis not present

## 2016-09-05 DIAGNOSIS — J449 Chronic obstructive pulmonary disease, unspecified: Secondary | ICD-10-CM | POA: Diagnosis present

## 2016-09-05 DIAGNOSIS — R6521 Severe sepsis with septic shock: Secondary | ICD-10-CM | POA: Diagnosis present

## 2016-09-05 DIAGNOSIS — Z96649 Presence of unspecified artificial hip joint: Secondary | ICD-10-CM | POA: Diagnosis present

## 2016-09-05 DIAGNOSIS — M79604 Pain in right leg: Secondary | ICD-10-CM | POA: Diagnosis not present

## 2016-09-05 DIAGNOSIS — Z515 Encounter for palliative care: Secondary | ICD-10-CM | POA: Diagnosis present

## 2016-09-05 DIAGNOSIS — I739 Peripheral vascular disease, unspecified: Secondary | ICD-10-CM | POA: Diagnosis present

## 2016-09-05 LAB — PHOSPHORUS: Phosphorus: 3.6 mg/dL (ref 2.5–4.6)

## 2016-09-05 LAB — BASIC METABOLIC PANEL
Anion gap: 12 (ref 5–15)
BUN: 33 mg/dL — AB (ref 6–20)
CHLORIDE: 99 mmol/L — AB (ref 101–111)
CO2: 25 mmol/L (ref 22–32)
CREATININE: 2.31 mg/dL — AB (ref 0.61–1.24)
Calcium: 10.3 mg/dL (ref 8.9–10.3)
GFR calc Af Amer: 29 mL/min — ABNORMAL LOW (ref >60)
GFR calc non Af Amer: 25 mL/min — ABNORMAL LOW (ref >60)
Glucose, Bld: 156 mg/dL — ABNORMAL HIGH (ref 65–99)
Potassium: 4.5 mmol/L (ref 3.5–5.1)
Sodium: 136 mmol/L (ref 135–145)

## 2016-09-05 LAB — CBC
HCT: 46.5 % (ref 40.0–52.0)
Hemoglobin: 15.4 g/dL (ref 13.0–18.0)
MCH: 32.8 pg (ref 26.0–34.0)
MCHC: 33.1 g/dL (ref 32.0–36.0)
MCV: 99 fL (ref 80.0–100.0)
PLATELETS: 167 10*3/uL (ref 150–440)
RBC: 4.7 MIL/uL (ref 4.40–5.90)
RDW: 13.4 % (ref 11.5–14.5)
WBC: 13 10*3/uL — ABNORMAL HIGH (ref 3.8–10.6)

## 2016-09-05 LAB — MAGNESIUM: Magnesium: 1.7 mg/dL (ref 1.7–2.4)

## 2016-09-05 NOTE — ED Notes (Signed)
Pt uprite on stretcher in exam room watching TV with no distress noted, pt denies c/o and st "he wants to go the hell home!"; MD notified

## 2016-09-05 NOTE — ED Notes (Signed)
Assisted into vehicle with his girlfriend

## 2016-09-05 NOTE — ED Notes (Signed)
Pt resting in bed, resp even and unlabored 

## 2016-09-05 NOTE — Discharge Instructions (Signed)
Your lab tests and chest xray today were unremarkable.

## 2016-09-05 NOTE — ED Notes (Addendum)
Pt pulled IV out, cannula intact, dsng applied; pt st "I told you I was ready to get the hell out of here! They ain't done nothing for me in here!"; MD notified

## 2016-09-05 NOTE — ED Provider Notes (Signed)
[facility] Emergency Department Provider Note  ____________________________________________  Time seen: Approximately 5:57 PM  I have reviewed the triage vital signs and the nursing notes.   HISTORY  Chief Complaint Weakness    HPI Bryce Clark is a 83 y.o. male complains of generalized weakness and bilateral leg pain with cramps as well as cramps in the hands for 2 hours. He's had this issue before but feels like normally doesn't last this long. Denies any chest pain or shortness of breath. Denies vomiting or diarrhea. Reports eating and drinking normally. No other complaints. Otherwise in his usual state of health. He has a history of bladder cancer but reports he has not been receiving any chemotherapy or radiation for a long time and that he is cancer free in remission.     Past Medical History:  Diagnosis Date  . AICD (automatic cardioverter/defibrillator) present   . Arthritis   . Bladder cancer ([place])   . CAD (coronary artery disease)   . Cardiomyopathy ([place])   . Chronic combined systolic and diastolic CHF (congestive heart failure) ([place])   . Chronic renal insufficiency   . COPD (chronic obstructive pulmonary disease) ([place])   . Diabetes mellitus without complication ([place])   . Dysrhythmia   . GERD (gastroesophageal reflux disease)   . Gout   . History of permanent cardiac pacemaker placement   . Hypertension   . Myocardial infarction ([place])   . Presence of permanent cardiac pacemaker   . Prostate cancer [facility])      Patient Active Problem List   Diagnosis Date Noted  . Dementia 06/17/2016  . Acute cholecystitis   . Dyspnea 01/11/2015  . UTI (urinary tract infection) 01/09/2015  . Elevated troponin 01/09/2015  . Chronic combined systolic and diastolic CHF (congestive heart failure) ([place]) 01/09/2015  . Type 2 diabetes mellitus ([place]) 01/09/2015  . CAD (coronary artery disease) 01/09/2015  . HTN (hypertension) 01/09/2015  . CKD (chronic  kidney disease), stage III 01/09/2015  . Bladder cancer ([place]) 11/06/2014  . Automatic implantable cardioverter-defibrillator in situ 10/02/2012  . Diabetes mellitus ([place]) 10/02/2012  . Acid reflux 10/02/2012  . Arthritis, degenerative 10/02/2012     Past Surgical History:  Procedure Laterality Date  . APPENDECTOMY    . BLADDER REMOVAL    . CARDIAC CATHETERIZATION    . CATARACT EXTRACTION W/PHACO Left 09/12/2014   Procedure: CATARACT EXTRACTION PHACO AND INTRAOCULAR LENS PLACEMENT (IOC);  Surgeon: Estill Cotta, MD;  Location: [facility] ORS;  Service: Ophthalmology;  Laterality: Left;  [place]: 01:11.7 AP: 25.6 CDE: 29.48   . CATARACT EXTRACTION W/PHACO Right 10/17/2014   Procedure: CATARACT EXTRACTION PHACO AND INTRAOCULAR LENS PLACEMENT (IOC);  Surgeon: Estill Cotta, MD;  Location: [facility] ORS;  Service: Ophthalmology;  Laterality: Right;  [place] 01:47 AP%61.8 CDE49.41 fluid pack lot # [id number]  . EP IMPLANTABLE DEVICE N/A 11/17/2015   Procedure: ICD/BIV ICD Generator Changeout;  Surgeon: Deboraha Sprang, MD;  Location: [place] CV LAB;  Service: Cardiovascular;  Laterality: N/A;  . ILEOSTOMY    . INSERT / REPLACE / REMOVE PACEMAKER    . PERIPHERAL VASCULAR CATHETERIZATION N/A 12/05/2014   Procedure: IVC Filter Insertion;  Surgeon: Algernon Huxley, MD;  Location: [place] CV LAB;  Service: Cardiovascular;  Laterality: N/A;  . REVISION UROSTOMY CUTANEOUS    . TOTAL HIP ARTHROPLASTY       Prior to Admission medications   Medication Sig Start Date End Date Taking? Authorizing Provider  BREO ELLIPTA 200-25 MCG/INH AEPB  Take 1 puff by mouth daily as needed (for shortness of breath).  07/05/15   [provider]  colchicine 0.6 MG tablet Take 0.6 mg by mouth every 12 (twelve) hours.  05/07/16   [provider]  diphenhydrAMINE (BENADRYL) 25 mg capsule Take 25 mg by mouth every 6 (six) hours as needed for allergies.    [provider]  furosemide (LASIX) 20 MG tablet  Take 20 mg by mouth every other day.  05/20/16   [provider]  metoprolol (LOPRESSOR) 50 MG tablet Take 50 mg by mouth 2 (two) times daily.    [provider]  OVER THE COUNTER MEDICATION Place 1 drop into both eyes as needed (for dry eyes). OTC Lubricant Eye Drop    [provider]  pantoprazole (PROTONIX) 40 MG tablet Take 40 mg by mouth 2 (two) times daily. 09/04/15   [provider]  sucralfate (CARAFATE) 1 g tablet Take 1 g by mouth daily as needed (for stomach).    [provider]  traZODone (DESYREL) 100 MG tablet Take 100 mg by mouth at bedtime. 09/06/15   [provider]     Allergies Xarelto [rivaroxaban]; Azithromycin; Codeine; and Oxycodone-acetaminophen   Family History  Problem Relation Age of Onset  . Stroke Father     Social History Social History  Substance Use Topics  . Smoking status: Never Smoker  . Smokeless tobacco: Never Used  . Alcohol use No    Review of Systems  Constitutional:   No fever or chills.  ENT:   No sore throat. No rhinorrhea. Cardiovascular:   No chest pain or syncope. Respiratory:   No dyspnea or cough. Gastrointestinal:   Negative for abdominal pain, vomiting and diarrhea.  Musculoskeletal:   Positive cramps in bilateral calves and bilateral hands All other systems reviewed and are negative except as documented above in ROS and HPI.  ____________________________________________   PHYSICAL EXAM:  VITAL SIGNS: ED Triage Vitals  Enc Vitals Group     BP 09/05/16 1753 127/85     Pulse Rate 09/05/16 1753 (!) 104     Resp 09/05/16 1753 16     Temp 09/05/16 1753 98 F (36.7 C)     Temp Source 09/05/16 1753 Oral     SpO2 09/05/16 1753 98 %     Weight 09/05/16 1753 180 lb (81.6 kg)     Height 09/05/16 1753 6' (1.829 m)     Head Circumference --      Peak Flow --      Pain Score 09/05/16 1752 0     Pain Loc --      Pain Edu? --      Excl. in [place]? --     Vital signs reviewed,  nursing assessments reviewed.   Constitutional:   Alert and oriented. Well appearing and in no distress. Eyes:   No scleral icterus. No conjunctival pallor. PERRL. EOMI.  No nystagmus. ENT   Head:   Normocephalic and atraumatic.   Nose:   No congestion/rhinnorhea.    Mouth/Throat:   Dry mucous membranes, no pharyngeal erythema. No peritonsillar mass.    Neck:   No meningismus. Full ROM Hematological/Lymphatic/Immunilogical:   No cervical lymphadenopathy. Cardiovascular:   Tachycardia heart rate 105. Symmetric bilateral radial and DP pulses.  No murmurs.  Respiratory:   Normal respiratory effort without tachypnea/retractions. Breath sounds are clear and equal bilaterally. No wheezes/rales/rhonchi. Gastrointestinal:   Soft and nontender. Non distended. There is no CVA tenderness.  No rebound, rigidity, or guarding. Urostomy in place no right lower quadrant Genitourinary:   deferred Musculoskeletal:   Normal range of motion in all extremities. No joint effusions.  No lower extremity tenderness.  No edema. No calf tenderness. No palpable cords. Negative Homans sign Neurologic:   Normal speech and language.  Motor grossly intact in all 4 extremities. No gross focal neurologic deficits are appreciated.  Skin:    Skin is warm, dry and intact. No rash noted.  No petechiae, purpura, or bullae.  ____________________________________________    LABS (pertinent positives/negatives) (all labs ordered are listed, but only abnormal results are displayed) Labs Reviewed  BASIC METABOLIC PANEL - Abnormal; Notable for the following:       Result Value   Chloride 99 (*)    Glucose, Bld 156 (*)    BUN 33 (*)    Creatinine, Ser 2.31 (*)    GFR calc non Af Amer 25 (*)    GFR calc Af Amer 29 (*)    All other components within normal limits  CBC - Abnormal; Notable for the following:    WBC 13.0 (*)    All other components within normal limits  URINALYSIS, COMPLETE (UACMP) WITH MICROSCOPIC   MAGNESIUM  PHOSPHORUS   ____________________________________________   EKG  Interpreted by me Atrial sensed ventricular paced rhythm, rate of 97. Left axis. Left bundle branch block. No acute ischemic changes.  ____________________________________________    [id number]  Dg Chest Portable 1 View  Result Date: 09/05/2016 CLINICAL DATA:  Increased weakness and bilateral leg pain and cramping. EXAM: PORTABLE CHEST 1 VIEW COMPARISON:  05/17/2015 FINDINGS: The heart size and mediastinal contours are within normal limits. There is aortic atherosclerosis with slight uncoiling of the thoracic aorta. ICD device projects over the left hemithorax with leads in the right atrium, coronary sinus and right ventricle. Port catheter tip is seen at the cavoatrial junction. No pneumonic consolidation or CHF. Minimal atelectasis at the lung bases. The visualized skeletal structures are unremarkable. IMPRESSION: No acute cardiopulmonary disease. Aortic atherosclerosis with ICD in place. Electronically Signed   By: Ashley Royalty M.D.   On: 09/05/2016 18:22    ____________________________________________   PROCEDURES Procedures  ____________________________________________   INITIAL IMPRESSION / ASSESSMENT AND PLAN / ED COURSE  Pertinent labs & imaging results that were available during my care of the patient were reviewed by me and considered in my medical decision making (see chart for details).  Patient presents with muscle cramps and generalized weakness. Mild tachycardia. Most likely dehydration as the patient does report some heat exposure recently with our warm seasonal weather. With his cancer history there is a possibility of recurrent malignancy and possible paraneoplastic syndrome. We'll check his electrolytes and get a chest x-ray. IV fluids for hydration. Low suspicion of acute infection or sepsis. Low suspicion of ACS PE dissection or AAA or mesenteric ischemia bowel perforation obstruction  or biliary disease.       ----------------------------------------- 7:20 PM on 09/05/2016 -----------------------------------------  Patient demanding to be discharged right away. Actually his labs are unremarkable and chest x-ray is unremarkable. No evidence of severe electrolyte anomaly. No acidosis. He is well-appearing neurologically intact. We'll discharge him home at his insistence. ____________________________________________   FINAL CLINICAL IMPRESSION(S) / ED DIAGNOSES  Final diagnoses:  Chronic kidney disease, unspecified CKD stage  Muscle cramps      New Prescriptions   No medications on file     Portions of this note were generated with dragon dictation software. Dictation  errors may occur despite best attempts at proofreading.    Carrie Mew, MD 09/05/16 [place] Perfect

## 2016-09-05 NOTE — ED Notes (Signed)
Pt was able to reach his girlfriend via phone and she is enroute to pick him up; first nurse notified

## 2016-09-05 NOTE — ED Triage Notes (Signed)
Pt here from home with increased weakness, bilateral leg pain and cramps; started 2 hour PTA.

## 2016-09-06 ENCOUNTER — Emergency Department: Payer: Medicare HMO

## 2016-09-06 ENCOUNTER — Inpatient Hospital Stay (HOSPITAL_COMMUNITY)
Admit: 2016-09-06 | Discharge: 2016-09-06 | Disposition: A | Discharge status: Home / Self Care | Payer: Medicare HMO | Attending: Internal Medicine | Admitting: Internal Medicine

## 2016-09-06 ENCOUNTER — Inpatient Hospital Stay
Admission: EM | Admit: 2016-09-06 | Discharge: 2016-10-06 | DRG: 871 | Disposition: E | Payer: Medicare HMO | Attending: Internal Medicine | Admitting: Internal Medicine

## 2016-09-06 ENCOUNTER — Encounter: Payer: Self-pay | Admitting: Emergency Medicine

## 2016-09-06 DIAGNOSIS — M109 Gout, unspecified: Secondary | ICD-10-CM | POA: Diagnosis present

## 2016-09-06 DIAGNOSIS — R579 Shock, unspecified: Secondary | ICD-10-CM | POA: Diagnosis present

## 2016-09-06 DIAGNOSIS — E1122 Type 2 diabetes mellitus with diabetic chronic kidney disease: Secondary | ICD-10-CM | POA: Diagnosis present

## 2016-09-06 DIAGNOSIS — N183 Chronic kidney disease, stage 3 (moderate): Secondary | ICD-10-CM | POA: Diagnosis present

## 2016-09-06 DIAGNOSIS — I252 Old myocardial infarction: Secondary | ICD-10-CM | POA: Diagnosis not present

## 2016-09-06 DIAGNOSIS — Z8052 Family history of malignant neoplasm of bladder: Secondary | ICD-10-CM | POA: Diagnosis not present

## 2016-09-06 DIAGNOSIS — R6521 Severe sepsis with septic shock: Secondary | ICD-10-CM | POA: Diagnosis present

## 2016-09-06 DIAGNOSIS — R9431 Abnormal electrocardiogram [ECG] [EKG]: Secondary | ICD-10-CM | POA: Diagnosis not present

## 2016-09-06 DIAGNOSIS — I130 Hypertensive heart and chronic kidney disease with heart failure and stage 1 through stage 4 chronic kidney disease, or unspecified chronic kidney disease: Secondary | ICD-10-CM | POA: Diagnosis present

## 2016-09-06 DIAGNOSIS — Z8042 Family history of malignant neoplasm of prostate: Secondary | ICD-10-CM | POA: Diagnosis not present

## 2016-09-06 DIAGNOSIS — N179 Acute kidney failure, unspecified: Secondary | ICD-10-CM | POA: Diagnosis present

## 2016-09-06 DIAGNOSIS — K219 Gastro-esophageal reflux disease without esophagitis: Secondary | ICD-10-CM | POA: Diagnosis present

## 2016-09-06 DIAGNOSIS — A419 Sepsis, unspecified organism: Secondary | ICD-10-CM | POA: Diagnosis present

## 2016-09-06 DIAGNOSIS — E872 Acidosis: Secondary | ICD-10-CM | POA: Diagnosis present

## 2016-09-06 DIAGNOSIS — Z8551 Personal history of malignant neoplasm of bladder: Secondary | ICD-10-CM | POA: Diagnosis not present

## 2016-09-06 DIAGNOSIS — I472 Ventricular tachycardia: Secondary | ICD-10-CM | POA: Diagnosis present

## 2016-09-06 DIAGNOSIS — I2699 Other pulmonary embolism without acute cor pulmonale: Secondary | ICD-10-CM

## 2016-09-06 DIAGNOSIS — Z79899 Other long term (current) drug therapy: Secondary | ICD-10-CM | POA: Diagnosis not present

## 2016-09-06 DIAGNOSIS — E871 Hypo-osmolality and hyponatremia: Secondary | ICD-10-CM | POA: Diagnosis present

## 2016-09-06 DIAGNOSIS — M79604 Pain in right leg: Secondary | ICD-10-CM | POA: Diagnosis not present

## 2016-09-06 DIAGNOSIS — Z95810 Presence of automatic (implantable) cardiac defibrillator: Secondary | ICD-10-CM | POA: Diagnosis not present

## 2016-09-06 DIAGNOSIS — Z888 Allergy status to other drugs, medicaments and biological substances status: Secondary | ICD-10-CM | POA: Diagnosis not present

## 2016-09-06 DIAGNOSIS — Z01818 Encounter for other preprocedural examination: Secondary | ICD-10-CM

## 2016-09-06 DIAGNOSIS — E875 Hyperkalemia: Secondary | ICD-10-CM | POA: Diagnosis present

## 2016-09-06 DIAGNOSIS — M79605 Pain in left leg: Secondary | ICD-10-CM | POA: Diagnosis not present

## 2016-09-06 DIAGNOSIS — J449 Chronic obstructive pulmonary disease, unspecified: Secondary | ICD-10-CM | POA: Diagnosis present

## 2016-09-06 DIAGNOSIS — I509 Heart failure, unspecified: Secondary | ICD-10-CM | POA: Diagnosis not present

## 2016-09-06 DIAGNOSIS — I5042 Chronic combined systolic (congestive) and diastolic (congestive) heart failure: Secondary | ICD-10-CM | POA: Diagnosis present

## 2016-09-06 DIAGNOSIS — Z881 Allergy status to other antibiotic agents status: Secondary | ICD-10-CM | POA: Diagnosis not present

## 2016-09-06 DIAGNOSIS — R52 Pain, unspecified: Secondary | ICD-10-CM

## 2016-09-06 DIAGNOSIS — Z885 Allergy status to narcotic agent status: Secondary | ICD-10-CM | POA: Diagnosis not present

## 2016-09-06 LAB — URINALYSIS, COMPLETE (UACMP) WITH MICROSCOPIC
BACTERIA UA: NONE SEEN
BILIRUBIN URINE: NEGATIVE
Glucose, UA: NEGATIVE mg/dL
Ketones, ur: 5 mg/dL — AB
Nitrite: NEGATIVE
PROTEIN: 100 mg/dL — AB
RBC / HPF: NONE SEEN RBC/hpf (ref 0–5)
SPECIFIC GRAVITY, URINE: 1.034 — AB (ref 1.005–1.030)
Squamous Epithelial / HPF: NONE SEEN
pH: 6 (ref 5.0–8.0)

## 2016-09-06 LAB — CBC WITH DIFFERENTIAL/PLATELET
BASOS PCT: 1 %
Basophils Absolute: 0.2 10*3/uL — ABNORMAL HIGH (ref 0–0.1)
EOS PCT: 0 %
Eosinophils Absolute: 0 10*3/uL (ref 0–0.7)
HEMATOCRIT: 48.9 % (ref 40.0–52.0)
HEMOGLOBIN: 16 g/dL (ref 13.0–18.0)
LYMPHS PCT: 15 %
Lymphs Abs: 3.1 10*3/uL (ref 1.0–3.6)
MCH: 32.6 pg (ref 26.0–34.0)
MCHC: 32.7 g/dL (ref 32.0–36.0)
MCV: 99.8 fL (ref 80.0–100.0)
MONOS PCT: 7 %
Monocytes Absolute: 1.4 10*3/uL — ABNORMAL HIGH (ref 0.2–1.0)
Neutro Abs: 15.9 10*3/uL — ABNORMAL HIGH (ref 1.4–6.5)
Neutrophils Relative %: 77 %
Platelets: 168 10*3/uL (ref 150–440)
RBC: 4.91 MIL/uL (ref 4.40–5.90)
RDW: 13.6 % (ref 11.5–14.5)
WBC: 20.6 10*3/uL — ABNORMAL HIGH (ref 3.8–10.6)

## 2016-09-06 LAB — HEPATIC FUNCTION PANEL
ALBUMIN: 4.7 g/dL (ref 3.5–5.0)
ALT: 21 U/L (ref 17–63)
AST: 43 U/L — AB (ref 15–41)
Alkaline Phosphatase: 65 U/L (ref 38–126)
Bilirubin, Direct: 0.1 mg/dL (ref 0.1–0.5)
Indirect Bilirubin: 1.1 mg/dL — ABNORMAL HIGH (ref 0.3–0.9)
TOTAL PROTEIN: 8.4 g/dL — AB (ref 6.5–8.1)
Total Bilirubin: 1.2 mg/dL (ref 0.3–1.2)

## 2016-09-06 LAB — MAGNESIUM: Magnesium: 1.8 mg/dL (ref 1.7–2.4)

## 2016-09-06 LAB — BASIC METABOLIC PANEL
ANION GAP: 19 — AB (ref 5–15)
BUN: 48 mg/dL — ABNORMAL HIGH (ref 6–20)
CO2: 17 mmol/L — ABNORMAL LOW (ref 22–32)
Calcium: 10.3 mg/dL (ref 8.9–10.3)
Chloride: 97 mmol/L — ABNORMAL LOW (ref 101–111)
Creatinine, Ser: 4.1 mg/dL — ABNORMAL HIGH (ref 0.61–1.24)
GFR calc Af Amer: 14 mL/min — ABNORMAL LOW (ref >60)
GFR, EST NON AFRICAN AMERICAN: 12 mL/min — AB (ref >60)
Glucose, Bld: 270 mg/dL — ABNORMAL HIGH (ref 65–99)
POTASSIUM: 5.1 mmol/L (ref 3.5–5.1)
Sodium: 133 mmol/L — ABNORMAL LOW (ref 135–145)

## 2016-09-06 LAB — APTT: APTT: 29 s (ref 24–36)

## 2016-09-06 LAB — PROCALCITONIN: Procalcitonin: 0.66 ng/mL

## 2016-09-06 LAB — LACTIC ACID, PLASMA
LACTIC ACID, VENOUS: 6.4 mmol/L — AB (ref 0.5–1.9)
Lactic Acid, Venous: 3.8 mmol/L (ref 0.5–1.9)

## 2016-09-06 LAB — TYPE AND SCREEN
ABO/RH(D): A POS
Antibody Screen: NEGATIVE

## 2016-09-06 LAB — PROTIME-INR
INR: 1.16
Prothrombin Time: 14.9 seconds (ref 11.4–15.2)

## 2016-09-06 LAB — GLUCOSE, CAPILLARY
GLUCOSE-CAPILLARY: 225 mg/dL — AB (ref 65–99)
Glucose-Capillary: 225 mg/dL — ABNORMAL HIGH (ref 65–99)

## 2016-09-06 LAB — CK: CK TOTAL: 314 U/L (ref 49–397)

## 2016-09-06 LAB — MRSA PCR SCREENING: MRSA BY PCR: NEGATIVE

## 2016-09-06 LAB — PHOSPHORUS: PHOSPHORUS: 4.5 mg/dL (ref 2.5–4.6)

## 2016-09-06 LAB — TROPONIN I: TROPONIN I: 0.04 ng/mL — AB (ref <0.03)

## 2016-09-06 MED ORDER — NOREPINEPHRINE BITARTRATE 1 MG/ML IV SOLN
0.0000 ug/min | Freq: Once | INTRAVENOUS | Status: AC
  Administered 2016-09-06: 10 ug/min via INTRAVENOUS
  Filled 2016-09-06: qty 4

## 2016-09-06 MED ORDER — HEPARIN BOLUS VIA INFUSION
4000.0000 [IU] | Freq: Once | INTRAVENOUS | Status: AC
  Administered 2016-09-06: 4000 [IU] via INTRAVENOUS
  Filled 2016-09-06: qty 4000

## 2016-09-06 MED ORDER — VANCOMYCIN HCL IN DEXTROSE 1-5 GM/200ML-% IV SOLN
1000.0000 mg | Freq: Once | INTRAVENOUS | Status: AC
  Administered 2016-09-06: 1000 mg via INTRAVENOUS
  Filled 2016-09-06: qty 200

## 2016-09-06 MED ORDER — SENNOSIDES-DOCUSATE SODIUM 8.6-50 MG PO TABS: 1.0000 | ORAL_TABLET | Freq: Every evening | ORAL | Status: DC | PRN

## 2016-09-06 MED ORDER — SODIUM CHLORIDE 0.9 % IV SOLN
INTRAVENOUS | Status: DC
  Administered 2016-09-06 – 2016-09-07 (×2): via INTRAVENOUS

## 2016-09-06 MED ORDER — INSULIN ASPART 100 UNIT/ML ~~LOC~~ SOLN
0.0000 [IU] | Freq: Three times a day (TID) | SUBCUTANEOUS | Status: DC
  Administered 2016-09-06 – 2016-09-07 (×2): 3 [IU] via SUBCUTANEOUS
  Filled 2016-09-06 (×2): qty 3

## 2016-09-06 MED ORDER — SODIUM CHLORIDE 0.9 % IV BOLUS (SEPSIS)
1448.0000 mL | Freq: Once | INTRAVENOUS | Status: AC
  Administered 2016-09-06: 1448 mL via INTRAVENOUS

## 2016-09-06 MED ORDER — TRAMADOL HCL 50 MG PO TABS
50.0000 mg | ORAL_TABLET | Freq: Four times a day (QID) | ORAL | Status: DC | PRN
  Administered 2016-09-06: 50 mg via ORAL
  Filled 2016-09-06: qty 1

## 2016-09-06 MED ORDER — ACETAMINOPHEN 650 MG RE SUPP: 650.0000 mg | Freq: Four times a day (QID) | RECTAL | Status: DC | PRN

## 2016-09-06 MED ORDER — MORPHINE SULFATE (PF) 2 MG/ML IV SOLN
2.0000 mg | INTRAVENOUS | Status: AC
  Administered 2016-09-06: 2 mg via INTRAVENOUS
  Filled 2016-09-06: qty 1

## 2016-09-06 MED ORDER — HEPARIN (PORCINE) IN NACL 100-0.45 UNIT/ML-% IJ SOLN
1600.0000 [IU]/h | INTRAMUSCULAR | Status: DC
  Administered 2016-09-06: 1300 [IU]/h via INTRAVENOUS
  Administered 2016-09-07: 1600 [IU]/h via INTRAVENOUS
  Filled 2016-09-06 (×3): qty 250

## 2016-09-06 MED ORDER — FENTANYL CITRATE (PF) 100 MCG/2ML IJ SOLN
75.0000 ug | Freq: Once | INTRAMUSCULAR | Status: AC
  Administered 2016-09-06: 75 ug via INTRAVENOUS
  Filled 2016-09-06: qty 2

## 2016-09-06 MED ORDER — SODIUM CHLORIDE 0.9 % IV BOLUS (SEPSIS)
1000.0000 mL | Freq: Once | INTRAVENOUS | Status: AC
  Administered 2016-09-06: 1000 mL via INTRAVENOUS

## 2016-09-06 MED ORDER — ONDANSETRON HCL 4 MG PO TABS
4.0000 mg | ORAL_TABLET | Freq: Four times a day (QID) | ORAL | Status: DC | PRN
  Administered 2016-09-06: 4 mg via ORAL

## 2016-09-06 MED ORDER — PROMETHAZINE HCL 25 MG/ML IJ SOLN
12.5000 mg | Freq: Four times a day (QID) | INTRAMUSCULAR | Status: DC | PRN
  Administered 2016-09-06 – 2016-09-07 (×2): 12.5 mg via INTRAVENOUS
  Filled 2016-09-06 (×2): qty 1

## 2016-09-06 MED ORDER — INSULIN ASPART 100 UNIT/ML ~~LOC~~ SOLN
0.0000 [IU] | Freq: Every day | SUBCUTANEOUS | Status: DC
  Administered 2016-09-06: 2 [IU] via SUBCUTANEOUS
  Filled 2016-09-06: qty 2

## 2016-09-06 MED ORDER — HYDROMORPHONE HCL 1 MG/ML IJ SOLN
INTRAMUSCULAR | Status: AC
  Filled 2016-09-06: qty 1

## 2016-09-06 MED ORDER — IOPAMIDOL (ISOVUE-370) INJECTION 76%
100.0000 mL | Freq: Once | INTRAVENOUS | Status: AC | PRN
  Administered 2016-09-06: 100 mL via INTRAVENOUS

## 2016-09-06 MED ORDER — SODIUM CHLORIDE 0.9 % IV BOLUS (SEPSIS): 1000.0000 mL | Freq: Once | INTRAVENOUS | Status: DC

## 2016-09-06 MED ORDER — ACETAMINOPHEN 325 MG PO TABS: 650.0000 mg | ORAL_TABLET | Freq: Four times a day (QID) | ORAL | Status: DC | PRN

## 2016-09-06 MED ORDER — VANCOMYCIN HCL IN DEXTROSE 1-5 GM/200ML-% IV SOLN: 1000.0000 mg | Freq: Once | INTRAVENOUS | Status: DC

## 2016-09-06 MED ORDER — MORPHINE SULFATE (PF) 2 MG/ML IV SOLN
1.0000 mg | INTRAVENOUS | Status: DC | PRN
  Administered 2016-09-06: 2 mg via INTRAVENOUS

## 2016-09-06 MED ORDER — KETOROLAC TROMETHAMINE 15 MG/ML IJ SOLN
15.0000 mg | Freq: Once | INTRAMUSCULAR | Status: AC
  Administered 2016-09-06: 15 mg via INTRAVENOUS
  Filled 2016-09-06: qty 1

## 2016-09-06 MED ORDER — HYDROMORPHONE HCL 1 MG/ML IJ SOLN
1.0000 mg | Freq: Once | INTRAMUSCULAR | Status: AC
  Administered 2016-09-06: 1 mg via INTRAVENOUS

## 2016-09-06 MED ORDER — ORAL CARE MOUTH RINSE
15.0000 mL | Freq: Two times a day (BID) | OROMUCOSAL | Status: DC
  Administered 2016-09-06: 15 mL via OROMUCOSAL

## 2016-09-06 MED ORDER — PIPERACILLIN-TAZOBACTAM 3.375 G IVPB
3.3750 g | Freq: Two times a day (BID) | INTRAVENOUS | Status: DC
  Administered 2016-09-07: 3.375 g via INTRAVENOUS
  Filled 2016-09-06 (×3): qty 50

## 2016-09-06 MED ORDER — PIPERACILLIN-TAZOBACTAM 3.375 G IVPB 30 MIN
3.3750 g | Freq: Once | INTRAVENOUS | Status: AC
  Administered 2016-09-06: 3.375 g via INTRAVENOUS
  Filled 2016-09-06: qty 50

## 2016-09-06 MED ORDER — ONDANSETRON HCL 4 MG PO TABS
ORAL_TABLET | ORAL | Status: AC
  Filled 2016-09-06: qty 1

## 2016-09-06 MED ORDER — KETAMINE HCL 10 MG/ML IJ SOLN
0.2000 mg/kg | Freq: Once | INTRAMUSCULAR | Status: AC
  Administered 2016-09-06: 16 mg via INTRAVENOUS
  Filled 2016-09-06: qty 1

## 2016-09-06 MED ORDER — MORPHINE SULFATE (PF) 2 MG/ML IV SOLN
2.0000 mg | INTRAVENOUS | Status: DC | PRN
  Administered 2016-09-06 – 2016-09-07 (×3): 4 mg via INTRAVENOUS
  Filled 2016-09-06 (×3): qty 2

## 2016-09-06 MED ORDER — ONDANSETRON HCL 4 MG/2ML IJ SOLN
4.0000 mg | Freq: Four times a day (QID) | INTRAMUSCULAR | Status: DC | PRN
  Filled 2016-09-06: qty 2

## 2016-09-06 MED ORDER — SODIUM CHLORIDE 0.9 % IV BOLUS (SEPSIS): 30.0000 mL/kg | Freq: Once | INTRAVENOUS | Status: DC

## 2016-09-06 MED ORDER — MORPHINE SULFATE (PF) 2 MG/ML IV SOLN
INTRAVENOUS | Status: AC
  Filled 2016-09-06: qty 1

## 2016-09-06 NOTE — Consult Note (Signed)
[facility] Consult Note  MRN : [id number]  Bryce Clark is a 83 y.o. (1932/09/13) male who presents with chief complaint of  Chief Complaint  Patient presents with  . Leg Pain  .  History of Present Illness: I am asked by Dr. Benjie Karvonen as well as the ER to see the patient regarding his cool, painful lower extremities. The patient presented with essentially replete numbness and inability to move his legs. That has improved but he still complains of numbness and pain. The pain is largely thighs and upper legs. His feet are cool and he does have some purplish discoloration of both feet worse on the right than the left. He does not have ulceration or infection. He has a litany of medical issues as described below. When he presented he was profoundly hypotensive he has now been placed on vasopressors. As found to have pulmonary emboli among other things has also been started on a heparin drip. For evaluation of his lower extremity perfusion, a CT angiogram was performed. I have independently reviewed his CT scan. He has aortoiliac calcifications without significant flow limitation. His femoral arteries are patent bilaterally and basically the contrast stops in the mid to distal superficial femoral artery or above-knee popliteal arteries on each side which I suspect is more due to the poor perfusion and timing of the contrast and it is any occlusions. He certainly has some peripheral arterial disease but is very difficult to discern how significant this is.  Current Facility-Administered Medications  Medication Dose Route Frequency Provider Last Rate Last Dose  . heparin ADULT infusion 100 units/mL (25000 units/212mL sodium chloride 0.45%)  1,300 Units/hr Intravenous Continuous Benjie Karvonen, Sital, MD 13 mL/hr at 09/20/2016 1515 1,300 Units/hr at 09/09/2016 1515  . insulin aspart (novoLOG) injection 0-5 Units  0-5 Units Subcutaneous QHS Mody, Sital, MD      . insulin aspart  (novoLOG) injection 0-9 Units  0-9 Units Subcutaneous TID WC Mody, Sital, MD      . Derrill Memo ON September 30, 2016] piperacillin-tazobactam (ZOSYN) IVPB 3.375 g  3.375 g Intravenous Q12H Merilyn Baba, RPH      . traMADol (ULTRAM) tablet 50 mg  50 mg Oral Q6H PRN Mody, Sital, MD      . vancomycin (VANCOCIN) IVPB 1000 mg/200 mL premix  1,000 mg Intravenous Once Merilyn Baba, [facility]       Current Outpatient Prescriptions  Medication Sig Dispense Refill  . BREO ELLIPTA 200-25 MCG/INH AEPB Take 1 puff by mouth daily as needed (for shortness of breath).   1  . colchicine 0.6 MG tablet Take 0.6 mg by mouth every 12 (twelve) hours.     . furosemide (LASIX) 20 MG tablet Take 20 mg by mouth every other day.     . gabapentin (NEURONTIN) 100 MG capsule Take 100 mg by mouth 3 (three) times daily.  4  . metoprolol (LOPRESSOR) 50 MG tablet Take 50 mg by mouth 2 (two) times daily.    Marland Kitchen OVER THE COUNTER MEDICATION Place 1 drop into both eyes as needed (for dry eyes). OTC Lubricant Eye Drop    . pantoprazole (PROTONIX) 40 MG tablet Take 40 mg by mouth 2 (two) times daily.  0  . sucralfate (CARAFATE) 1 g tablet Take 1 g by mouth daily as needed (for stomach).    . traZODone (DESYREL) 100 MG tablet Take 100 mg by mouth at bedtime.  4  . diphenhydrAMINE (BENADRYL) 25 mg capsule Take 25 mg by  mouth every 6 (six) hours as needed for allergies.     Facility-Administered Medications Ordered in Other Encounters  Medication Dose Route Frequency Provider Last Rate Last Dose  . sodium chloride 0.9 % injection 10 mL  10 mL Intravenous PRN Lloyd Huger, MD   10 mL at 10/26/14 1519    Past Medical History:  Diagnosis Date  . AICD (automatic cardioverter/defibrillator) present   . Arthritis   . Bladder cancer ([place])   . CAD (coronary artery disease)   . Cardiomyopathy ([place])   . Chronic combined systolic and diastolic CHF (congestive heart failure) ([place])   . Chronic renal insufficiency   . COPD (chronic obstructive  pulmonary disease) ([place])   . Diabetes mellitus without complication ([place])   . Dysrhythmia   . GERD (gastroesophageal reflux disease)   . Gout   . History of permanent cardiac pacemaker placement   . Hypertension   . Myocardial infarction ([place])   . Presence of permanent cardiac pacemaker   . Prostate cancer [facility])     Past Surgical History:  Procedure Laterality Date  . APPENDECTOMY    . BLADDER REMOVAL    . CARDIAC CATHETERIZATION    . CATARACT EXTRACTION W/PHACO Left 09/12/2014   Procedure: CATARACT EXTRACTION PHACO AND INTRAOCULAR LENS PLACEMENT (IOC);  Surgeon: Estill Cotta, MD;  Location: [facility] ORS;  Service: Ophthalmology;  Laterality: Left;  [place]: 01:11.7 AP: 25.6 CDE: 29.48   . CATARACT EXTRACTION W/PHACO Right 10/17/2014   Procedure: CATARACT EXTRACTION PHACO AND INTRAOCULAR LENS PLACEMENT (IOC);  Surgeon: Estill Cotta, MD;  Location: [facility] ORS;  Service: Ophthalmology;  Laterality: Right;  [place] 01:47 AP%61.8 CDE49.41 fluid pack lot # [id number]  . EP IMPLANTABLE DEVICE N/A 11/17/2015   Procedure: ICD/BIV ICD Generator Changeout;  Surgeon: Deboraha Sprang, MD;  Location: [facility];  Service: Cardiovascular;  Laterality: N/A;  . ILEOSTOMY    . INSERT / REPLACE / REMOVE PACEMAKER    . PERIPHERAL VASCULAR CATHETERIZATION N/A 12/05/2014   Procedure: IVC Filter Insertion;  Surgeon: Algernon Huxley, MD;  Location: [facility];  Service: Cardiovascular;  Laterality: N/A;  . REVISION UROSTOMY CUTANEOUS    . TOTAL HIP ARTHROPLASTY      Social History Social History  Substance Use Topics  . Smoking status: Never Smoker  . Smokeless tobacco: Never Used  . Alcohol use No  No IV drug use  Family History Family History  Problem Relation Age of Onset  . Stroke Father   No bleeding disorders, clotting disorders, or autoimmune diseases, or aneurysms  Allergies  Allergen Reactions  . Xarelto [Rivaroxaban] Other (See Comments)    Reaction:  Bleeding   .  Azithromycin Other (See Comments)    Reaction:  Cold sweats   . Codeine Nausea And Vomiting  . Oxycodone-Acetaminophen Nausea And Vomiting     REVIEW OF SYSTEMS (Negative unless checked)  Constitutional: [] Weight loss  [] Fever  [] Chills Cardiac: [] Chest pain   [] Chest pressure   [x] Palpitations   [] Shortness of breath when laying flat   [] Shortness of breath at rest   [] Shortness of breath with exertion. Vascular:  [] Pain in legs with walking   [] Pain in legs at rest   [] Pain in legs when laying flat   [] Claudication   [] Pain in feet when walking  [x] Pain in feet at rest  [] Pain in feet when laying flat   [x] History of DVT   [] Phlebitis   [] Swelling in legs   [] Varicose veins   []   Non-healing ulcers Pulmonary:   [] Uses home oxygen   [] Productive cough   [] Hemoptysis   [] Wheeze  [] COPD   [] Asthma Neurologic:  [] Dizziness  [] Blackouts   [] Seizures   [] History of stroke   [] History of TIA  [] Aphasia   [] Temporary blindness   [] Dysphagia   [] Weakness or numbness in arms   [] Weakness or numbness in legs Musculoskeletal:  [] Arthritis   [] Joint swelling   [] Joint pain   [] Low back pain Hematologic:  [x] Easy bruising  [] Easy bleeding   [] Hypercoagulable state   [] Anemic  [] Hepatitis Gastrointestinal:  [] Blood in stool   [] Vomiting blood  [] Gastroesophageal reflux/heartburn   [] Difficulty swallowing. Genitourinary:  [x] Chronic kidney disease   [] Difficult urination  [] Frequent urination  [] Burning with urination   [] Blood in urine Skin:  [] Rashes   [] Ulcers   [] Wounds Psychological:  [] History of anxiety   []  History of major depression.  Physical Examination  Vitals:   09/25/2016 1445 10/03/2016 1500 10/04/2016 1515 09/27/2016 1530  BP: (!) 160/93 (!) 150/100 131/83 131/84  Pulse: 99 (!) 103 (!) 104 (!) 105  Resp: 15 19 17 17   Temp:      TempSrc:      SpO2: 100% 100% (!) 86% 100%   There is no height or weight on file to calculate BMI. Gen:  WD/WN, NAD. Chronically ill appearing Head: [place]/AT, No  temporalis wasting.  Ear/Nose/Throat: Hearing grossly intact, nares w/o erythema or drainage, oropharynx w/o Erythema/Exudate Eyes: Sclera non-icteric, conjunctiva clear Neck: Trachea midline.  No JVD.  Pulmonary:  Good air movement, respirations not labored, equal bilaterally.  Cardiac: irregular Vascular:  Vessel Right Left  Radial Palpable Palpable  Ulnar Palpable Palpable  Brachial Palpable Palpable  Carotid Palpable, without bruit Palpable, without bruit  Aorta Not palpable N/A  Femoral Palpable Palpable  Popliteal Not Palpable Not Palpable  PT Not Palpable Not Palpable  DP Dopplerable Dopplerable   Gastrointestinal: soft, non-tender/non-distended.  Musculoskeletal: M/S 5/5 throughout. Feet are both cool with some degree of cyanosis present worse on the right than the left. Capillary refill is present but sluggish. He is able to move his feet and feel touch so sensation and motor appear to be intact. Neurologic: Sensation grossly intact in extremities.  Symmetrical.  Speech is fluent. Motor exam as listed above. Psychiatric: Judgment intact, Mood & affect appropriate for pt's clinical situation. Dermatologic: No rashes or ulcers noted.  No cellulitis or open wounds.       CBC Lab Results  Component Value Date   WBC 20.6 (H) 09/21/2016   HGB 16.0 09/24/2016   HCT 48.9 09/10/2016   MCV 99.8 09/27/2016   PLT 168 09/30/2016    BMET    Component Value Date/Time   NA 133 (L) 09/30/2016 1226   NA 138 11/10/2015 0942   NA 137 05/09/2014 1310   K 5.1 09/25/2016 1226   K 4.1 05/09/2014 1310   CL 97 (L) 09/29/2016 1226   CL 105 05/09/2014 1310   CO2 17 (L) 09/08/2016 1226   CO2 29 05/09/2014 1310   GLUCOSE 270 (H) 10/03/2016 1226   GLUCOSE 110 (H) 05/09/2014 1310   BUN 48 (H) 09/21/2016 1226   BUN 28 (H) 11/10/2015 0942   BUN 18 05/09/2014 1310   CREATININE 4.10 (H) 09/28/2016 1226   CREATININE 1.47 (H) 05/09/2014 1310   CALCIUM 10.3 09/19/2016 1226   CALCIUM 9.3  05/09/2014 1310   GFRNONAA 12 (L) 09/06/2016 1226   GFRNONAA 49 (L) 05/09/2014 1310   GFRNONAA  41 (L) 12/20/2013 1124   GFRAA 14 (L) 09/29/2016 1226   GFRAA 59 (L) 05/09/2014 1310   GFRAA 47 (L) 12/20/2013 1124   Estimated Creatinine Clearance: 15.2 mL/min (A) (by C-G formula based on SCr of 4.1 mg/dL (H)).  COAG Lab Results  Component Value Date   INR 1.16 09/10/2016   INR 1.0 09/15/2015   INR 1.11 01/09/2015    Radiology Dg Chest Port 1 View  Result Date: 09/18/2016 CLINICAL DATA:  Hypotension. Prostate carcinoma. Pulmonary embolus. EXAM: PORTABLE CHEST 1 VIEW COMPARISON:  Chest radiograph Sep 05, 2016.  Chest CT September 06, 2016 FINDINGS: There is no edema or consolidation. Pacemaker leads are attached to the right atrium, right ventricle, and coronary sinus. Heart is upper normal in size with pulmonary vascularity within normal limits. Port-A-Cath tip is in the superior vena cava. No pneumothorax. There is aortic atherosclerosis. No evident bone lesions. No adenopathy appreciable by radiography. IMPRESSION: No edema or consolidation. Stable cardiac silhouette. There is aortic atherosclerosis. Electronically Signed   By: Lowella Grip III M.D.   On: 09/14/2016 14:08   Dg Chest Portable 1 View  Result Date: 09/05/2016 CLINICAL DATA:  Increased weakness and bilateral leg pain and cramping. EXAM: PORTABLE CHEST 1 VIEW COMPARISON:  05/17/2015 FINDINGS: The heart size and mediastinal contours are within normal limits. There is aortic atherosclerosis with slight uncoiling of the thoracic aorta. ICD device projects over the left hemithorax with leads in the right atrium, coronary sinus and right ventricle. Port catheter tip is seen at the cavoatrial junction. No pneumonic consolidation or CHF. Minimal atelectasis at the lung bases. The visualized skeletal structures are unremarkable. IMPRESSION: No acute cardiopulmonary disease. Aortic atherosclerosis with ICD in place. Electronically Signed    By: Ashley Royalty M.D.   On: 09/05/2016 18:22   Ct Angio Chest/abd/pel For Dissection W And/or W/wo  Result Date: 09/11/2016 CLINICAL DATA:  Lateral leg pain final chronic the worse today, BILATERAL lower extremity weakness and numbness question dissection, history hypertension, coronary artery disease post MI, cardiomyopathy, diabetes mellitus, COPD, prostate cancer, bladder cancer EXAM: CT ANGIOGRAPHY CHEST, ABDOMEN AND PELVIS TECHNIQUE: Multidetector CT imaging through the chest, abdomen and pelvis was performed using the standard protocol during bolus administration of intravenous contrast. Multiplanar reconstructed images and MIPs were obtained and reviewed to evaluate the vascular anatomy. CONTRAST:  100 cc Isovue 370 IV COMPARISON:  CT abdomen pelvis 05/14/2016, CT chest 09/10/2008 FINDINGS: CTA CHEST FINDINGS Cardiovascular: Atherosclerotic calcifications aorta, proximal great vessels and minimally in coronary arteries. Beam hardening artifacts from pacemaker leads via LEFT subclavian approach extending into the RIGHT atrium and RIGHT ventricle as well as coronary sinus. Aneurysmal dilatation ascending thoracic aorta 4.3 cm transverse image 28. No intramural hemorrhage on precontrast images. Following contrast, normal aortic enhancement is seen without aortic dissection. Several artifacts are present at the inferior aortic arch, felt to be related to beam hardening artifacts from a combination of calcified aortic plaque, pacemaker generator in anterior LEFT chest wall and dense contrast/pacemaker leads in the SVC. However, filling defects are identified in RIGHT lower lobe pulmonary arteries consistent with pulmonary embolism. Additional RIGHT upper lobe pulmonary embolus anteriorly image 42. No definite LEFT sided emboli. RV/LV ratio = 0.8, normal Mediastinum/Nodes: Predominately air-filled esophagus throughout its length. Suspect small hiatal hernia. Base of cervical region unremarkable. No thoracic  adenopathy. Lungs/Pleura: Dependent atelectasis in the posterior lungs. Underlying emphysematous changes. No infiltrate, pleural effusion or pneumothorax. 7 mm nodule in RIGHT middle lobe at at minor fissure  image 30 unchanged. Musculoskeletal: No acute osseous findings. Review of the MIP images confirms the above findings. CTA ABDOMEN AND PELVIS FINDINGS VASCULAR Aorta: Atherosclerotic calcifications aorta with scattered thrombus. Aorta normal caliber without aneurysm or dissection. No periaortic hemorrhage or infiltration. Celiac: Atherosclerotic plaque at origin with less than 50% diameter stenosis SMA: Small amount of proximal SMA calcified plaque without significant stenosis Renals: Plaque at the origins of the renal arteries bilaterally with probably less than 50% stenosis on the LEFT and greater 50% stenosis on the RIGHT. IMA: Origin patent though arising between dense atherosclerotic plaques. Inflow: Scattered atherosclerotic plaques throughout the iliac and femoral systems without high-grade stenosis. Veins: Veins unopacified at time of CT imaging, patency not assessed. IVC filter noted infrarenal. Review of the MIP images confirms the above findings. NON-VASCULAR Hepatobiliary: Calcified gallstones within contracted gallbladder. Liver unremarkable Pancreas: Normal appearance Spleen: Normal appearance Adrenals/Urinary Tract: Thickening of adrenal glands without discrete mass. BILATERAL significant renal cortical atrophy without gross evidence of mass or hydronephrosis. Renal vascular calcifications. Bladder surgically absent with diversion urostomy in RIGHT lower quadrant. Nondilated ureters. Stomach/Bowel: Appendix surgically absent by history. Minimal sigmoid diverticulosis without evidence of diverticulitis. Stomach and bowel loops otherwise normal appearance. Lymphatic: No adenopathy Reproductive: Prostate gland surgically absent with nonvisualization of seminal vesicles. Other: No free air free fluid.   No hernia. Musculoskeletal: Demineralized with advanced degenerative disc disease changes at L4-L5 and significant facet degenerative changes at lower lumbar spine at multiple levels. Grade 1 anterolisthesis L4-L5 without spondylolysis. Review of the MIP images confirms the above findings. IMPRESSION: Extensive atherosclerotic calcifications including aorta, proximal great vessels, abdominal branch vessels, and iliofemoral systems as above, only minimally in coronary arteries. No evidence of aortic aneurysm or dissection. Filling defects within multiple RIGHT pulmonary arteries consistent with pulmonary emboli. Cholelithiasis. Minimal distal colonic diverticulosis. Stable RIGHT middle lobe pulmonary nodule at minor fissure since 2010 indicative of a benign process. Findings called to Dr. Mable Paris on 09/28/2016 at 1354 hr. Electronically Signed   By: Lavonia Dana M.D.   On: 09/11/2016 13:56      Assessment/Plan 1. Bilateral lower extremity pain and numbness. This is some better after his blood pressure has improved, but he still complains of this. The pain is largely in the thighs which would not be consistent with ischemia. His feet are cool and somewhat cyanotic, but this is not surprising given the fact he is on a potent vasopressor levophed and has a very poor cardiac function at baseline. 2. PAD. I have independently reviewed his CT scan. He has aortoiliac calcifications without significant flow limitation. His femoral arteries are patent bilaterally and basically the contrast stops in the mid to distal superficial femoral artery or above-knee popliteal arteries on each side which I suspect is more due to the poor perfusion and timing of the contrast and it is any occlusions. He certainly has some peripheral arterial disease but is very difficult to discern how significant this is. I think his symptoms are exacerbated with chronic PAD with his hypotension and now requiring pressors. He is on  anticoagulation for ulnar emboli now. That would be appropriate for his peripheral arterial disease as well although I don't think he should need that long-term for peripheral arterial disease. Given his overall poor status, immediate intervention is unlikely to be of benefit and potentially harmful and I would delay this until he is at his baseline or further. He would benefit from dedicated arterial duplex of lower extremities unfortunately that is not available with  our ultrasound department here. Angiogram may be considered at some point in the future or an outpatient workup. No immediate intervention likely benefit. 3. Cardiomyopathy/congestive heart failure. I suspect his lower extremity appearance is largely due to this. He apparently has a baseline ejection fraction of only 25%. 4. Hypotension. Volume resuscitation would be appropriate. Levophed will certainly help his pressure but will not help his lower extremity perfusion and if another medicine can be used that may be more full to his arterial status of the lower extremities. 5. Acute on chronic renal failure. Likely secondary to poor perfusion. Hydration would be of benefit  Overall, this is a critically ill patient with multiple ongoing comorbidities. His poor cardiopulmonary status and his multiple comorbidities are his major issues at this point. Although he does have some degree of PAD I do not think that is necessarily an acute issue. He is at very high risk for complications including morbidity and mortality during this hospitalization. For now, continuing anticoagulation is likely the most prudent option from a vascular standpoint.   Leotis Pain, MD  10/03/2016 3:38 PM    This note was created with Dragon medical transcription system.  Any error is purely unintentional

## 2016-09-06 NOTE — Progress Notes (Signed)
Pharmacy Antibiotic Note  Bryce Clark is a 83 y.o. male admitted on 10/03/2016 with sepsis from unknown source.  Pharmacy has been consulted for vancomycin and piperacillin/tazobactam dosing. Patient has received vancomycin 1000 mg IV x 1 and piperacillin/tazo 3.375 x 1 in ED Patient was seen in ED yesterday and had SCr of 2.3. Today SCr is 4.1  Plan: Begin piperacillin/tazobactam 3.375 g IV q 12 hours  Due to AKI, will give vancomycin loading dose (2000 mg IV total) and will draw vanc random 24 hours after loading dose    Temp (24hrs), Avg:97.9 F (36.6 C), Min:97.8 F (36.6 C), Max:98 F (36.7 C)   Recent Labs Lab 09/05/16 1755 09/13/2016 1226 10/03/2016 1311  WBC 13.0* 20.6*  --   CREATININE 2.31* 4.10*  --   LATICACIDVEN  --   --  6.4*    Estimated Creatinine Clearance: 15.2 mL/min (A) (by C-G formula based on SCr of 4.1 mg/dL (H)).    Allergies  Allergen Reactions  . Xarelto [Rivaroxaban] Other (See Comments)    Reaction:  Bleeding   . Azithromycin Other (See Comments)    Reaction:  Cold sweats   . Codeine Nausea And Vomiting  . Oxycodone-Acetaminophen Nausea And Vomiting  6/1  Antimicrobials this admission: Piperacillin/tazo 6/1 >>  vancomycin 6/1 >>   Dose adjustments this admission:  Microbiology results: 6/1 BCx: sent 6/1 UCx: sent   Thank you for allowing pharmacy to be a part of this patient's care.  Darrow Bussing, PharmD Pharmacy Resident 09/09/2016 3:23 PM

## 2016-09-06 NOTE — ED Provider Notes (Signed)
[facility] Emergency Department Provider Note  ____________________________________________   First MD Initiated Contact with Patient 09/29/2016 1224     (approximate)  I have reviewed the triage vital signs and the nursing notes.   HISTORY  Chief Complaint Leg Pain  History is challenging to obtain as the patient is a poor historian  HPI Bryce Clark is a 83 y.o. male who comes to the emergency department via EMS with roughly 24 hours of severe bilateral lower extremity cramping pain. He was seen in our emergency department yesterday where he had unremarkable electrolytes was given IV fluids and discharged home. He said shortly after going home he began to have numbness and weakness in his legs and his weakness has progressed to the point where he can no longer walk. His symptoms are progressive and worsening. He has severe pain in bilateral lower extremities that is nonradiating. It is stabbing and cramping. He has no chest pain and no abdominal pain whatsoever. He has a complex past medical history including coronary artery disease, COPD, hypertension, prostate cancer,remote history of appendectomy and a neobladder as well as an ileostomy.   Past Medical History:  Diagnosis Date  . AICD (automatic cardioverter/defibrillator) present   . Arthritis   . Bladder cancer ([place])   . CAD (coronary artery disease)   . Cardiomyopathy ([place])   . Chronic combined systolic and diastolic CHF (congestive heart failure) ([place])   . Chronic renal insufficiency   . COPD (chronic obstructive pulmonary disease) ([place])   . Diabetes mellitus without complication ([place])   . Dysrhythmia   . GERD (gastroesophageal reflux disease)   . Gout   . History of permanent cardiac pacemaker placement   . Hypertension   . Myocardial infarction ([employer])   . Presence of permanent cardiac pacemaker   . Prostate cancer [facility])     Patient Active Problem List   Diagnosis Date Noted  . Shock  ([place]) 09/21/2016  . Dementia 06/17/2016  . Acute cholecystitis   . Dyspnea 01/11/2015  . UTI (urinary tract infection) 01/09/2015  . Elevated troponin 01/09/2015  . Chronic combined systolic and diastolic CHF (congestive heart failure) ([place]) 01/09/2015  . Type 2 diabetes mellitus ([place]) 01/09/2015  . CAD (coronary artery disease) 01/09/2015  . HTN (hypertension) 01/09/2015  . CKD (chronic kidney disease), stage III 01/09/2015  . Bladder cancer ([place]) 11/06/2014  . Automatic implantable cardioverter-defibrillator in situ 10/02/2012  . Diabetes mellitus ([place]) 10/02/2012  . Acid reflux 10/02/2012  . Arthritis, degenerative 10/02/2012    Past Surgical History:  Procedure Laterality Date  . APPENDECTOMY    . BLADDER REMOVAL    . CARDIAC CATHETERIZATION    . CATARACT EXTRACTION W/PHACO Left 09/12/2014   Procedure: CATARACT EXTRACTION PHACO AND INTRAOCULAR LENS PLACEMENT (IOC);  Surgeon: Estill Cotta, MD;  Location: [facility] ORS;  Service: Ophthalmology;  Laterality: Left;  [place]: 01:11.7 AP: 25.6 CDE: 29.48   . CATARACT EXTRACTION W/PHACO Right 10/17/2014   Procedure: CATARACT EXTRACTION PHACO AND INTRAOCULAR LENS PLACEMENT (IOC);  Surgeon: Estill Cotta, MD;  Location: [facility] ORS;  Service: Ophthalmology;  Laterality: Right;  [place] 01:47 AP%61.8 CDE49.41 fluid pack lot # [id number]  . EP IMPLANTABLE DEVICE N/A 11/17/2015   Procedure: ICD/BIV ICD Generator Changeout;  Surgeon: Deboraha Sprang, MD;  Location: [place] CV LAB;  Service: Cardiovascular;  Laterality: N/A;  . ILEOSTOMY    . INSERT / REPLACE / REMOVE PACEMAKER    . PERIPHERAL VASCULAR CATHETERIZATION N/A 12/05/2014   Procedure:  IVC Filter Insertion;  Surgeon: Algernon Huxley, MD;  Location: [place] CV LAB;  Service: Cardiovascular;  Laterality: N/A;  . REVISION UROSTOMY CUTANEOUS    . TOTAL HIP ARTHROPLASTY      Prior to Admission medications   Medication Sig Start Date End Date Taking? Authorizing Provider  BREO ELLIPTA  200-25 MCG/INH AEPB Take 1 puff by mouth daily as needed (for shortness of breath).  07/05/15  Yes [provider]  colchicine 0.6 MG tablet Take 0.6 mg by mouth every 12 (twelve) hours.  05/07/16  Yes [provider]  furosemide (LASIX) 20 MG tablet Take 20 mg by mouth every other day.  05/20/16  Yes [provider]  gabapentin (NEURONTIN) 100 MG capsule Take 100 mg by mouth 3 (three) times daily. 07/28/16  Yes [provider]  metoprolol (LOPRESSOR) 50 MG tablet Take 50 mg by mouth 2 (two) times daily.   Yes [provider]  OVER THE COUNTER MEDICATION Place 1 drop into both eyes as needed (for dry eyes). OTC Lubricant Eye Drop   Yes [provider]  pantoprazole (PROTONIX) 40 MG tablet Take 40 mg by mouth 2 (two) times daily. 09/04/15  Yes [provider]  sucralfate (CARAFATE) 1 g tablet Take 1 g by mouth daily as needed (for stomach).   Yes [provider]  traZODone (DESYREL) 100 MG tablet Take 100 mg by mouth at bedtime. 09/06/15  Yes [provider]  diphenhydrAMINE (BENADRYL) 25 mg capsule Take 25 mg by mouth every 6 (six) hours as needed for allergies.    [provider]    Allergies Xarelto [rivaroxaban]; Azithromycin; Codeine; and Oxycodone-acetaminophen  Family History  Problem Relation Age of Onset  . Stroke Father     Social History Social History  Substance Use Topics  . Smoking status: Never Smoker  . Smokeless tobacco: Never Used  . Alcohol use No    Review of Systems Constitutional: No fever/chills Eyes: No visual changes. ENT: No sore throat. Cardiovascular: Denies chest pain. Respiratory: Denies shortness of breath. Gastrointestinal: No abdominal pain.  No nausea, no vomiting.  No diarrhea.  No constipation. Genitourinary: Negative for dysuria. Musculoskeletal: Positive for leg pain Skin: Negative for rash. Neurological: Negative for headaches, focal weakness or  numbness.   ____________________________________________   PHYSICAL EXAM:  VITAL SIGNS: ED Triage Vitals  Enc Vitals Group     BP 09/27/2016 1219 (!) 84/47     Pulse Rate 09/19/2016 1219 (!) 119     Resp 09/20/2016 1219 (!) 25     Temp --      Temp Source 10/03/2016 1219 Oral     SpO2 10/01/2016 1219 96 %     Weight --      Height --      Head Circumference --      Peak Flow --      Pain Score 09/06/16 1216 10     Pain Loc --      Pain Edu? --      Excl. in [place]? --     Constitutional:Diaphoretic and ill appearing screaming and moaning in pain Eyes: PERRL EOMI. Head: Atraumatic. Nose: No congestion/rhinnorhea. Mouth/Throat: No trismus Neck: No stridor.   Cardiovascular: Tachycardic rate, regular rhythm. Grossly normal heart sounds.  Significantly delayed peripheral circulation Respiratory: Normal respiratory effort.  No retractions. Lungs CTAB and moving good air Gastrointestinal: Urostomy in place soft abdomen nontender Musculoskeletal: No lower extremity edema   Neurologic:  No movement in bilateral  lower extremities and no sensation to sharp below the knee on the right and below the upper shin on the left No appreciable pulse in either leg bilaterally with delayed capillary refill and dark purple discolored skin Skin:  Diaphoretic. Psychiatric: Mood and affect are normal. Speech and behavior are normal.    ____________________________________________   DIFFERENTIAL  Aortic dissection, AAA, sepsis, anaphylaxis, obstructive shock, cardiogenic shock ____________________________________________   LABS (all labs ordered are listed, but only abnormal results are displayed)  Labs Reviewed  BASIC METABOLIC PANEL - Abnormal; Notable for the following:       Result Value   Sodium 133 (*)    Chloride 97 (*)    CO2 17 (*)    Glucose, Bld 270 (*)    BUN 48 (*)    Creatinine, Ser 4.10 (*)    GFR calc non Af Amer 12 (*)    GFR calc Af Amer 14 (*)    Anion gap 19 (*)    All  other components within normal limits  HEPATIC FUNCTION PANEL - Abnormal; Notable for the following:    Total Protein 8.4 (*)    AST 43 (*)    Indirect Bilirubin 1.1 (*)    All other components within normal limits  CBC WITH DIFFERENTIAL/PLATELET - Abnormal; Notable for the following:    WBC 20.6 (*)    Neutro Abs 15.9 (*)    Monocytes Absolute 1.4 (*)    Basophils Absolute 0.2 (*)    All other components within normal limits  TROPONIN I - Abnormal; Notable for the following:    Troponin I 0.04 (*)    All other components within normal limits  LACTIC ACID, PLASMA - Abnormal; Notable for the following:    Lactic Acid, Venous 6.4 (*)    All other components within normal limits  CULTURE, BLOOD (ROUTINE X 2)  CULTURE, BLOOD (ROUTINE X 2)  URINE CULTURE  CK  MAGNESIUM  PHOSPHORUS  URINALYSIS, COMPLETE (UACMP) WITH MICROSCOPIC  LACTIC ACID, PLASMA  PROCALCITONIN  TYPE AND SCREEN    Elevated lactate and white count are nonspecific __________________________________________  EKG   ____________________________________________  RADIOLOGY  CT angiogram of chest abdomen and pelvis is negative for dissection positive for PE on the right ____________________________________________   PROCEDURES  Procedure(s) performed: no  Procedures  Critical Care performed: yes  CRITICAL CARE Performed by: Darel Hong   Total critical care time: 60 minutes  Critical care time was exclusive of separately billable procedures and treating other patients.  Critical care was necessary to treat or prevent imminent or life-threatening deterioration.  Critical care was time spent personally by me on the following activities: development of treatment plan with patient and/or surrogate as well as nursing, discussions with consultants, evaluation of patient's response to treatment, examination of patient, obtaining history from patient or surrogate, ordering and performing treatments and  interventions, ordering and review of laboratory studies, ordering and review of radiographic studies, pulse oximetry and re-evaluation of patient's condition.   Observation: no____________________________________________   INITIAL IMPRESSION / ASSESSMENT AND PLAN / ED COURSE  Pertinent labs & imaging results that were available during my care of the patient were reviewed by me and considered in my medical decision making (see chart for details).  On arrival the patient was diaphoretic with a blood pressure of 60/40 and critically ill. He was clearly paralyzed in bilateral lower extremities reporting cramping bilateral lower extremity pain. Differential is broad but most concerning for aortic catastrophe given bilateral symptoms.  I understand that the patient was seen here yesterday and he has chronic kidney disease and his creatinine will likely be high today however given his clinical picture the benefits outweigh the risk and he will require CT angios of the aorta without labs coming back. I initially mixed up a dirty epi-drip with 1 mg of epinephrine and a liter of fluid and began rubbing it as Levothroid was taking too long to come from the pharmacy. His blood pressure initially responded to the epinephrine but then Levothroid came up so we hung the Levothroid instead. I escorted the patient to the CT scanner myself.     ----------------------------------------- 1:34 PM on 09/27/2016 -----------------------------------------  I discussed the case with Dr. Lucky Cowboy on call for vascular surgery who reviewed the images in real time with me. He sees no obvious occlusions and does see a aortic atherosclerosis and possibly chronic SFA issues but no acute issues to explain the patient's symptoms. Interestingly at this point now that his blood pressure is up again the patient is now moving his legs in reporting that he can feel his legs more than he could before. At this point I don't have a clear  diagnosis but the patient clearly is in shock. He is not in hemorrhagic shock as his hemoglobin is 16*returning the uncrossed blood that we never gave. I'm now concerned about possible sepsis so we will broaden her IV fluids to 30 cc/kg at a blood culture in brought antibiotics. ____________________________________________   ----------------------------------------- 2:00 PM on 09/23/2016 -----------------------------------------  Allergies called by radiology that the patient has multiple right-sided pulmonary embolisms without signs of right heart strain. I will treat him with heparin drip to make it easily reversible but I doubt that these PEs of the cause of his clinical condition. FINAL CLINICAL IMPRESSION(S) / ED DIAGNOSES  Final diagnoses:  Shock ([place])  Other acute pulmonary embolism without acute cor pulmonale (HCC)  Acute kidney injury ([place])      NEW MEDICATIONS STARTED DURING THIS VISIT:  New Prescriptions   No medications on file     Note:  This document was prepared using Dragon voice recognition software and may include unintentional dictation errors.     Darel Hong, MD 09/30/2016 1408

## 2016-09-06 NOTE — ED Triage Notes (Signed)
Pt brought in by [facility] from home for bilateral leg pain. Pt states that he has chronic leg pain but the pain got acutely worse today. Pt states that his lower legs are numb and he is having severe pain in both leg. Pt initial blood pressure on arrival to ED was 84/47.

## 2016-09-06 NOTE — ED Notes (Signed)
Dr. Mable Paris at patient bedside. Pt c/o severe pain in bilateral legs. Pt unable to move legs, Dr. Mable Paris unable to palpate pulses in either feet. Pt feet purplish in color and cool to touch.

## 2016-09-06 NOTE — Progress Notes (Signed)
Pt now complaining of being hot, feeling like he is not getting enough air, and acid reflux. [facility]Link physician paged will continue to assess.

## 2016-09-06 NOTE — H&P (Signed)
[facility] [place] NAME: Bryce Clark    MR#:  [id number]  DATE OF BIRTH:  [birth year]  DATE OF ADMISSION:  09/09/2016  PRIMARY CARE PHYSICIAN: Cletis Athens, MD   REQUESTING/REFERRING PHYSICIAN:dr [email or web address]  CHIEF COMPLAINT:   Leg weakness HISTORY OF PRESENT ILLNESS:  Bryce Clark  is a 83 y.o. male with a known history of CAD with AICD, combined systolic and diastolic heart failure EF 25% and COPD who presents via EMS with chief complaint of bilateral leg pain. Patient was evaluated in the emergency room yesterday for weakness and leg pain/cramps E was given IV fluids and sent home. Patient has chronic leg pain and cramps however today reports that this is severe. When he arrived via EMS his blood pressure was in the 80s and subsequent emergency room 8 went down to 60s. Both of his lower extremities had poor perfusion and he was unable to lift his lower extremities. Patient was given IV fluids and Levaquin was started and patient's perfusion to lower extremities has improved.  Patient himself is a very poor historian and cannot remember that he was emergency room yesterday. ER physicians spoke with Dr.Dew who reviewed the images. He sees no obvious occlusions. He has extensive atherosclerotic disease and chronic SFA issues but no acute issues to explain patient's symptoms.   CT lung, abdomen and pelvis performed in the emergency room to which shows Extensive atherosclerotic calcifications including aorta, proximal great vessels, abdominal branch vessels, and iliofemoral systems. It also shows bilateral small right-sided pulmonary emboli  He is started on vancomycin and Zosyn by ER physician as well as heparin drip. He is also started on levofed for low blood pressure Blood pressure has improved.  PAST MEDICAL HISTORY:   Past Medical History:  Diagnosis Date  . AICD (automatic cardioverter/defibrillator) present   . Arthritis   .  Bladder cancer ([place])   . CAD (coronary artery disease)   . Cardiomyopathy ([place])   . Chronic combined systolic and diastolic CHF (congestive heart failure) ([place])   . Chronic renal insufficiency   . COPD (chronic obstructive pulmonary disease) ([place])   . Diabetes mellitus without complication ([place])   . Dysrhythmia   . GERD (gastroesophageal reflux disease)   . Gout   . History of permanent cardiac pacemaker placement   . Hypertension   . Myocardial infarction ([place])   . Presence of permanent cardiac pacemaker   . Prostate cancer ([place])     PAST SURGICAL HISTORY:   Past Surgical History:  Procedure Laterality Date  . APPENDECTOMY    . BLADDER REMOVAL    . CARDIAC CATHETERIZATION    . CATARACT EXTRACTION W/PHACO Left 09/12/2014   Procedure: CATARACT EXTRACTION PHACO AND INTRAOCULAR LENS PLACEMENT (IOC);  Surgeon: Estill Cotta, MD;  Location: [facility] ORS;  Service: Ophthalmology;  Laterality: Left;  [place]: 01:11.7 AP: 25.6 CDE: 29.48   . CATARACT EXTRACTION W/PHACO Right 10/17/2014   Procedure: CATARACT EXTRACTION PHACO AND INTRAOCULAR LENS PLACEMENT (IOC);  Surgeon: Estill Cotta, MD;  Location: [facility] ORS;  Service: Ophthalmology;  Laterality: Right;  [place] 01:47 AP%61.8 CDE49.41 fluid pack lot # [id number]  . EP IMPLANTABLE DEVICE N/A 11/17/2015   Procedure: ICD/BIV ICD Generator Changeout;  Surgeon: Deboraha Sprang, MD;  Location: [facility];  Service: Cardiovascular;  Laterality: N/A;  . ILEOSTOMY    . INSERT / REPLACE / REMOVE PACEMAKER    . PERIPHERAL VASCULAR CATHETERIZATION N/A 12/05/2014   Procedure: IVC Filter Insertion;  Surgeon: Algernon Huxley, MD;  Location: [facility];  Service: Cardiovascular;  Laterality: N/A;  . REVISION UROSTOMY CUTANEOUS    . TOTAL HIP ARTHROPLASTY      SOCIAL HISTORY:   Social History  Substance Use Topics  . Smoking status: Never Smoker  . Smokeless tobacco: Never Used  . Alcohol use No    FAMILY HISTORY:   Family History   Problem Relation Age of Onset  . Stroke Father     DRUG ALLERGIES:   Allergies  Allergen Reactions  . Xarelto [Rivaroxaban] Other (See Comments)    Reaction:  Bleeding   . Azithromycin Other (See Comments)    Reaction:  Cold sweats   . Codeine Nausea And Vomiting  . Oxycodone-Acetaminophen Nausea And Vomiting    REVIEW OF SYSTEMS:   Review of Systems  Constitutional: Positive for malaise/fatigue. Negative for chills and fever.  HENT: Negative.  Negative for ear discharge, ear pain, hearing loss, nosebleeds and sore throat.   Eyes: Negative.  Negative for blurred vision and pain.  Respiratory: Negative.  Negative for cough, hemoptysis, shortness of breath and wheezing.   Cardiovascular: Negative.  Negative for chest pain, palpitations and leg swelling.  Gastrointestinal: Negative.  Negative for abdominal pain, blood in stool, diarrhea, nausea and vomiting.  Genitourinary: Negative.  Negative for dysuria.  Musculoskeletal: Negative.  Negative for back pain.       Leg pain  Skin: Negative.   Neurological: Positive for weakness. Negative for dizziness, tremors, speech change, focal weakness, seizures and headaches.  Endo/Heme/Allergies: Negative.  Does not bruise/bleed easily.  Psychiatric/Behavioral: Negative.  Negative for depression, hallucinations and suicidal ideas.    MEDICATIONS AT HOME:   Prior to Admission medications   Medication Sig Start Date End Date Taking? Authorizing Provider  BREO ELLIPTA 200-25 MCG/INH AEPB Take 1 puff by mouth daily as needed (for shortness of breath).  07/05/15  Yes [provider]  colchicine 0.6 MG tablet Take 0.6 mg by mouth every 12 (twelve) hours.  05/07/16  Yes [provider]  furosemide (LASIX) 20 MG tablet Take 20 mg by mouth every other day.  05/20/16  Yes [provider]  gabapentin (NEURONTIN) 100 MG capsule Take 100 mg by mouth 3 (three) times daily. 07/28/16  Yes [provider]  metoprolol  (LOPRESSOR) 50 MG tablet Take 50 mg by mouth 2 (two) times daily.   Yes [provider]  OVER THE COUNTER MEDICATION Place 1 drop into both eyes as needed (for dry eyes). OTC Lubricant Eye Drop   Yes [provider]  pantoprazole (PROTONIX) 40 MG tablet Take 40 mg by mouth 2 (two) times daily. 09/04/15  Yes [provider]  sucralfate (CARAFATE) 1 g tablet Take 1 g by mouth daily as needed (for stomach).   Yes [provider]  traZODone (DESYREL) 100 MG tablet Take 100 mg by mouth at bedtime. 09/06/15  Yes [provider]  diphenhydrAMINE (BENADRYL) 25 mg capsule Take 25 mg by mouth every 6 (six) hours as needed for allergies.    [provider]      VITAL SIGNS:  Blood pressure (!) 161/86, pulse (!) 111, temperature 97.8 F (36.6 C), temperature source Rectal, resp. rate 18, SpO2 100 %.  PHYSICAL EXAMINATION:   Physical Exam  Constitutional: He is oriented to person, place, and time and well-developed, well-nourished, and in no distress. No distress.  HENT:  Head: Normocephalic.  Eyes: No scleral icterus.  Neck: Normal range of  motion. Neck supple. No JVD present. No tracheal deviation present.  Cardiovascular: Normal rate, regular rhythm and normal heart sounds.  Exam reveals no gallop and no friction rub.   No murmur heard. Pulmonary/Chest: Effort normal and breath sounds normal. No respiratory distress. He has no wheezes. He has no rales. He exhibits no tenderness.  AICD placed  Abdominal: Soft. Bowel sounds are normal. He exhibits no distension and no mass. There is no tenderness. There is no rebound and no guarding.  Musculoskeletal: Normal range of motion. He exhibits no edema.  Neurological: He is alert and oriented to person, place, and time.  However cannot remember that he was here in the ER yesterday  Skin: Skin is warm. No rash noted. No erythema.  Right foot with dark toes   Psychiatric: Affect and judgment normal.       LABORATORY PANEL:   CBC  Recent Labs Lab 09/30/2016 1226  WBC 20.6*  HGB 16.0  HCT 48.9  PLT 168   ------------------------------------------------------------------------------------------------------------------  Chemistries   Recent Labs Lab 09/09/2016 1226  NA 133*  K 5.1  CL 97*  CO2 17*  GLUCOSE 270*  BUN 48*  CREATININE 4.10*  CALCIUM 10.3  MG 1.8  AST 43*  ALT 21  ALKPHOS 65  BILITOT 1.2   ------------------------------------------------------------------------------------------------------------------  Cardiac Enzymes  Recent Labs Lab 09/17/2016 1226  TROPONINI 0.04*   ------------------------------------------------------------------------------------------------------------------  RADIOLOGY:  Dg Chest Portable 1 View  Result Date: 09/05/2016 CLINICAL DATA:  Increased weakness and bilateral leg pain and cramping. EXAM: PORTABLE CHEST 1 VIEW COMPARISON:  05/17/2015 FINDINGS: The heart size and mediastinal contours are within normal limits. There is aortic atherosclerosis with slight uncoiling of the thoracic aorta. ICD device projects over the left hemithorax with leads in the right atrium, coronary sinus and right ventricle. Port catheter tip is seen at the cavoatrial junction. No pneumonic consolidation or CHF. Minimal atelectasis at the lung bases. The visualized skeletal structures are unremarkable. IMPRESSION: No acute cardiopulmonary disease. Aortic atherosclerosis with ICD in place. Electronically Signed   By: Ashley Royalty M.D.   On: 09/05/2016 18:22   Ct Angio Chest/abd/pel For Dissection W And/or W/wo  Result Date: 09/20/2016 CLINICAL DATA:  Lateral leg pain final chronic the worse today, BILATERAL lower extremity weakness and numbness question dissection, history hypertension, coronary artery disease post MI, cardiomyopathy, diabetes mellitus, COPD, prostate cancer, bladder cancer EXAM: CT ANGIOGRAPHY CHEST, ABDOMEN AND PELVIS TECHNIQUE:  Multidetector CT imaging through the chest, abdomen and pelvis was performed using the standard protocol during bolus administration of intravenous contrast. Multiplanar reconstructed images and MIPs were obtained and reviewed to evaluate the vascular anatomy. CONTRAST:  100 cc Isovue 370 IV COMPARISON:  CT abdomen pelvis 05/14/2016, CT chest 09/10/2008 FINDINGS: CTA CHEST FINDINGS Cardiovascular: Atherosclerotic calcifications aorta, proximal great vessels and minimally in coronary arteries. Beam hardening artifacts from pacemaker leads via LEFT subclavian approach extending into the RIGHT atrium and RIGHT ventricle as well as coronary sinus. Aneurysmal dilatation ascending thoracic aorta 4.3 cm transverse image 28. No intramural hemorrhage on precontrast images. Following contrast, normal aortic enhancement is seen without aortic dissection. Several artifacts are present at the inferior aortic arch, felt to be related to beam hardening artifacts from a combination of calcified aortic plaque, pacemaker generator in anterior LEFT chest wall and dense contrast/pacemaker leads in the SVC. However, filling defects are identified in RIGHT lower lobe pulmonary arteries consistent with pulmonary embolism. Additional RIGHT upper lobe pulmonary embolus anteriorly image 42. No definite LEFT sided  emboli. RV/LV ratio = 0.8, normal Mediastinum/Nodes: Predominately air-filled esophagus throughout its length. Suspect small hiatal hernia. Base of cervical region unremarkable. No thoracic adenopathy. Lungs/Pleura: Dependent atelectasis in the posterior lungs. Underlying emphysematous changes. No infiltrate, pleural effusion or pneumothorax. 7 mm nodule in RIGHT middle lobe at at minor fissure image 30 unchanged. Musculoskeletal: No acute osseous findings. Review of the MIP images confirms the above findings. CTA ABDOMEN AND PELVIS FINDINGS VASCULAR Aorta: Atherosclerotic calcifications aorta with scattered thrombus. Aorta normal  caliber without aneurysm or dissection. No periaortic hemorrhage or infiltration. Celiac: Atherosclerotic plaque at origin with less than 50% diameter stenosis SMA: Small amount of proximal SMA calcified plaque without significant stenosis Renals: Plaque at the origins of the renal arteries bilaterally with probably less than 50% stenosis on the LEFT and greater 50% stenosis on the RIGHT. IMA: Origin patent though arising between dense atherosclerotic plaques. Inflow: Scattered atherosclerotic plaques throughout the iliac and femoral systems without high-grade stenosis. Veins: Veins unopacified at time of CT imaging, patency not assessed. IVC filter noted infrarenal. Review of the MIP images confirms the above findings. NON-VASCULAR Hepatobiliary: Calcified gallstones within contracted gallbladder. Liver unremarkable Pancreas: Normal appearance Spleen: Normal appearance Adrenals/Urinary Tract: Thickening of adrenal glands without discrete mass. BILATERAL significant renal cortical atrophy without gross evidence of mass or hydronephrosis. Renal vascular calcifications. Bladder surgically absent with diversion urostomy in RIGHT lower quadrant. Nondilated ureters. Stomach/Bowel: Appendix surgically absent by history. Minimal sigmoid diverticulosis without evidence of diverticulitis. Stomach and bowel loops otherwise normal appearance. Lymphatic: No adenopathy Reproductive: Prostate gland surgically absent with nonvisualization of seminal vesicles. Other: No free air free fluid.  No hernia. Musculoskeletal: Demineralized with advanced degenerative disc disease changes at L4-L5 and significant facet degenerative changes at lower lumbar spine at multiple levels. Grade 1 anterolisthesis L4-L5 without spondylolysis. Review of the MIP images confirms the above findings. IMPRESSION: Extensive atherosclerotic calcifications including aorta, proximal great vessels, abdominal branch vessels, and iliofemoral systems as above,  only minimally in coronary arteries. No evidence of aortic aneurysm or dissection. Filling defects within multiple RIGHT pulmonary arteries consistent with pulmonary emboli. Cholelithiasis. Minimal distal colonic diverticulosis. Stable RIGHT middle lobe pulmonary nodule at minor fissure since 2010 indicative of a benign process. Findings called to Dr. Mable Paris on 09/21/2016 at 1354 hr. Electronically Signed   By: Lavonia Dana M.D.   On: 09/30/2016 13:56    EKG:    Ventricular paced rhythm left bundle branch block IMPRESSION AND PLAN:   83 year old male with history of combined systolic and diastolic heart failure EF 25%, AICD, diabetes, recurrent bladder cancer and chronic kidney disease stage III presents with sepsis and hypotension.  1. Sepsis with hypotension/septic shock/leukocytosis/lactic acidosis: Continue Zosyn and vancomycin Follow-up on UA and blood in urine cultures. Continue pressors to keep MAP>65 Perfusion has improved now that the pressure has improved. Continue IV fluids  2. Multiple pulmonary emboli seen on CT scan: Heparin drip Echocardiogram  3. Acute on chronic kidney disease stage III: This is due to shock Hold nephrotoxic agents Nephrology consultation Consider renal ultrasound  4. Diabetes: Sliding scale ordered Refer to ICU diabetes protocol.  5. Bilateral lower extremity pain with chronic pain and severe atherosclerotic disease Vascular surgery consult Physical therapy consult Pain management Now that blood pressures improved perfusion to lower extremities has improved  6. Elevated troponin: Trend troponins  7. Mild hyponatremia: Start IV fluids and repeat BMP in a.m.  8. Combined systolic and diastolic heart failure EF of 25% status post AICD/a sneaker Watch  for signs of fluid overload  9. Recurrent bladder cancer: Patient is followed by [facility] records are reviewed and case discussed with ED provider. Management plans  discussed with the patient and he is in agreement  CODE STATUS: LIMITED  CRITICAL CARETOTAL TIME TAKING CARE OF THIS PATIENT: 60 minutes.    Draden Cottingham M.D on 09/09/2016 at 2:06 PM  Between 7am to 6pm - Pager - [phone]  After 6pm go to [email or web address] - password [email or web address] [facility]  Office  [phone]  CC: Primary care physician; Cletis Athens, MD

## 2016-09-06 NOTE — Progress Notes (Signed)
eLink Physician-Brief Progress Note Patient Name: Bryce Clark DOB: 01/01/1933 MRN: [id number]   Date of Service  09/11/2016  HPI/Events of Note  Pt admitted with sepsis of unclear source PE Shock  eICU Interventions  Continue heparin, vanco, zosyn Levophed just turned off. Monitor BP     Intervention Category Evaluation Type: New Patient Evaluation  Wetona Viramontes 09/17/2016, 4:54 PM

## 2016-09-06 NOTE — Progress Notes (Signed)
ANTICOAGULATION CONSULT NOTE - Initial Consult  Pharmacy Consult for Heparin Indication: pulmonary embolus  Allergies  Allergen Reactions  . Xarelto [Rivaroxaban] Other (See Comments)    Reaction:  Bleeding   . Azithromycin Other (See Comments)    Reaction:  Cold sweats   . Codeine Nausea And Vomiting  . Oxycodone-Acetaminophen Nausea And Vomiting    Patient Measurements:  6', 81.6 kg Heparin Dosing Weight: 81.6 kg  Vital Signs: Temp: 97.8 F (36.6 C) (06/01 1351) Temp Source: Rectal (06/01 1351) BP: 161/86 (06/01 1340) Pulse Rate: 111 (06/01 1336)  Labs:  Recent Labs  09/05/16 1755 09/17/2016 1226  HGB 15.4 16.0  HCT 46.5 48.9  PLT 167 168  CREATININE 2.31* 4.10*  CKTOTAL  --  314  TROPONINI  --  0.04*    Estimated Creatinine Clearance: 15.2 mL/min (A) (by C-G formula based on SCr of 4.1 mg/dL (H)).   Medical History: Past Medical History:  Diagnosis Date  . AICD (automatic cardioverter/defibrillator) present   . Arthritis   . Bladder cancer ([place])   . CAD (coronary artery disease)   . Cardiomyopathy ([place])   . Chronic combined systolic and diastolic CHF (congestive heart failure) ([place])   . Chronic renal insufficiency   . COPD (chronic obstructive pulmonary disease) ([place])   . Diabetes mellitus without complication ([place])   . Dysrhythmia   . GERD (gastroesophageal reflux disease)   . Gout   . History of permanent cardiac pacemaker placement   . Hypertension   . Myocardial infarction ([place])   . Presence of permanent cardiac pacemaker   . Prostate cancer [facility])      Assessment: 83 yo male found to have multiple right-sided PEs starting on heparin drip.  No oral anticoagulants noted on PTA med list.   Goal of Therapy:  Heparin level 0.3-0.7 units/ml Monitor platelets by anticoagulation protocol: Yes   Plan:  APTT and INR ordered stat add on - called lab and stated they have appropriate tube Heparin bolus 4000 units IV x1 then heparin drip 1300 units/hr  (=13 ml/hr) Heparin level in 8h after starting drip CBC in AM  Pharmacy will continue to follow.  Rayna Sexton L 09/15/2016,2:18 PM

## 2016-09-06 NOTE — ED Notes (Signed)
Dr dew in with pt now.

## 2016-09-06 NOTE — ED Notes (Signed)
Pt transported to CT by this RN, [place], [place], and Dr. Mable Paris.

## 2016-09-06 NOTE — Consult Note (Signed)
Name: Bryce Clark MRN: [id number] DOB: 20-Mar-1933    ADMISSION DATE:  09/25/2016 CONSULTATION DATE: 09/06/2016  REFERRING MD : Dr. Benjie Karvonen   CHIEF COMPLAINT: Leg Pain   BRIEF PATIENT DESCRIPTION:  83 yo male admitted 06/1 with multiple right lower lobe pulmonary emboli, bilateral lower extremity pain with cyanosis secondary to PAD, ?septic shock with hypotension unclear source, leukocytosis, and lactic acidosis  SIGNIFICANT EVENTS  06/1-Pt admitted to ICU   STUDIES:  CT Angio Chest/Abd/Pelvis 06/1>>Extensive atherosclerotic calcifications including aorta, proximal great vessels, abdominal branch vessels, and iliofemoral systems as above, only minimally in coronary arteries. No evidence of aortic aneurysm or dissection. Filling defects within multiple RIGHT pulmonary arteries consistent with pulmonary emboli. Cholelithiasis. Minimal distal colonic diverticulosis. Stable RIGHT middle lobe pulmonary nodule at minor fissure since 2010 indicative of a benign process.  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  He presented to [facility] ER 06/1 via EMS with c/o severe bilateral lower extremity cramping onset of symptoms 24 hrs prior to presentation to the ER.  Per ER notes he was seen in the ER 05/31 with similar symptoms and labs were obtained revealing unremarkable electrolytes, therefore he was given IV fluids and discharged home.  However, shortly after arriving home he began to have numbness and weakness in his legs affecting the pts ability to walk. Due to severe bilateral lower extremity pain pt notified EMS on 06/1 and upon EMS arrival it was noted the pt was hypotensive with systolic bp in the 17'P.  Upon arrival to the  ER pts systolic bp remained low in the 60's, therefore he was given 2.5L NS bolus and antibiotics.  However, he remained hypotensive requiring levophed gtt.  A CT Angio Chest/Abd/Pelvis was obtained revealing multiple right lower lobe pulmonary emboli.  Therefore, he was subsequently admitted to the ICU for further workup and treatment   PAST MEDICAL HISTORY :   has a past medical history of AICD (automatic cardioverter/defibrillator) present; Arthritis; Bladder cancer ([place]); CAD (coronary artery disease); Cardiomyopathy ([place]); Chronic combined systolic and diastolic CHF (congestive heart failure) ([place]); Chronic renal insufficiency; COPD (chronic obstructive pulmonary disease) ([place]); Diabetes mellitus without complication ([place]); Dysrhythmia; GERD (gastroesophageal reflux disease); Gout; History of permanent cardiac pacemaker placement; Hypertension; Myocardial infarction ([place]); Presence of permanent cardiac pacemaker; and Prostate cancer ([place]).  has a past surgical history that includes Cardiac catheterization; Ileostomy; Bladder removal; Appendectomy; Insert / replace / remove pacemaker; Total hip arthroplasty; Revision urostomy cutaneous; Cataract extraction w/PHACO (Left, 09/12/2014); Cardiac catheterization (N/A, 12/05/2014); Cataract extraction w/PHACO (Right, 10/17/2014); and Cardiac catheterization (N/A, 11/17/2015). Prior to Admission medications   Medication Sig Start Date End Date Taking? Authorizing Provider  BREO ELLIPTA 200-25 MCG/INH AEPB Take 1 puff by mouth daily as needed (for shortness of breath).  07/05/15  Yes [provider]  colchicine 0.6 MG tablet Take 0.6 mg by mouth every 12 (twelve) hours.  05/07/16  Yes [provider]  furosemide (LASIX) 20 MG tablet Take 20 mg by mouth every other day.  05/20/16  Yes [provider]  gabapentin (NEURONTIN) 100 MG capsule Take 100 mg by mouth 3 (three) times daily. 07/28/16  Yes [provider]  metoprolol  (LOPRESSOR) 50 MG tablet Take 50 mg by mouth 2 (  two) times daily.   Yes [provider]  OVER THE COUNTER MEDICATION Place 1 drop into both eyes as needed (for dry eyes). OTC Lubricant Eye Drop   Yes [provider]  pantoprazole (PROTONIX) 40 MG tablet Take 40 mg by mouth 2 (two) times daily. 09/04/15  Yes [provider]  sucralfate (CARAFATE) 1 g tablet Take 1 g by mouth daily as needed (for stomach).   Yes [provider]  traZODone (DESYREL) 100 MG tablet Take 100 mg by mouth at bedtime. 09/06/15  Yes [provider]  diphenhydrAMINE (BENADRYL) 25 mg capsule Take 25 mg by mouth every 6 (six) hours as needed for allergies.    [provider]   Allergies  Allergen Reactions  . Xarelto [Rivaroxaban] Other (See Comments)    Reaction:  Bleeding   . Azithromycin Other (See Comments)    Reaction:  Cold sweats   . Codeine Nausea And Vomiting  . Oxycodone-Acetaminophen Nausea And Vomiting    FAMILY HISTORY:  family history includes Stroke in his father. SOCIAL HISTORY:  reports that he has never smoked. He has never used smokeless tobacco. He reports that he does not drink alcohol or use drugs.  REVIEW OF SYSTEMS:  Positives in BOLD Constitutional: Negative for fever, chills, weight loss, malaise/fatigue and diaphoresis.  HENT: Negative for hearing loss, ear pain, nosebleeds, congestion, sore throat, neck pain, tinnitus and ear discharge.   Eyes: Negative for blurred vision, double vision, photophobia, pain, discharge and redness.  Respiratory: Negative for cough, hemoptysis, sputum production, shortness of breath, wheezing and stridor.   Cardiovascular: right sided chest pain, palpitations, orthopnea, claudication, leg swelling and PND.  Gastrointestinal: heartburn, nausea, vomiting, abdominal pain, diarrhea, constipation, blood in stool and melena.  Genitourinary: Negative for dysuria, urgency, frequency, hematuria and flank pain.    Musculoskeletal: bilateral lower extremity pain, myalgias, back pain, joint pain and falls.  Skin: Negative for itching and rash.  Neurological: Negative for dizziness, tingling, tremors, sensory change, speech change, focal weakness, seizures, loss of consciousness, weakness and headaches.  Endo/Heme/Allergies: Negative for environmental allergies and polydipsia. Does not bruise/bleed easily.  SUBJECTIVE:  Pt c/o right sided chest pain worse during breathing and bilateral lower extremity pain   VITAL SIGNS: Temp:  [97.8 F (36.6 C)-98 F (36.7 C)] 97.8 F (36.6 C) (06/01 1351) Pulse Rate:  [80-119] 105 (06/01 1530) Resp:  [12-25] 17 (06/01 1530) BP: (67-185)/(41-100) 131/84 (06/01 1530) SpO2:  [86 %-100 %] 100 % (06/01 1530) Weight:  [81.6 kg (180 lb)] 81.6 kg (180 lb) (05/31 1753)  PHYSICAL EXAMINATION: General: well developed, well nourished Caucasian male  Neuro: alert and oriented, follows commands HEENT: supple, mild JVD Cardiovascular: vpaced, no M/R/G Lungs: diminished throughout, mildly labored and tachypneic  Abdomen: +BS x4, soft, non tender, non distended  Musculoskeletal: moves all extremities, 2+ bilateral lower extremity edema, palpable bilateral femoral pulses, unable to doppler distal tibial pulses, bilateral lower extremities cyanotic and cool to touch  Skin: intact no rashes or lesions   Recent Labs Lab 09/05/16 1755 09/10/2016 1226  NA 136 133*  K 4.5 5.1  CL 99* 97*  CO2 25 17*  BUN 33* 48*  CREATININE 2.31* 4.10*  GLUCOSE 156* 270*    Recent Labs Lab 09/05/16 1755 09/22/2016 1226  HGB 15.4 16.0  HCT 46.5 48.9  WBC 13.0* 20.6*  PLT 167 168   Dg Chest Port 1 View  Result Date: 09/30/2016 CLINICAL DATA:  Hypotension. Prostate carcinoma. Pulmonary embolus. EXAM: PORTABLE CHEST  1 VIEW COMPARISON:  Chest radiograph Sep 05, 2016.  Chest CT September 06, 2016 FINDINGS: There is no edema or consolidation. Pacemaker leads are attached to the right atrium,  right ventricle, and coronary sinus. Heart is upper normal in size with pulmonary vascularity within normal limits. Port-A-Cath tip is in the superior vena cava. No pneumothorax. There is aortic atherosclerosis. No evident bone lesions. No adenopathy appreciable by radiography. IMPRESSION: No edema or consolidation. Stable cardiac silhouette. There is aortic atherosclerosis. Electronically Signed   By: Lowella Grip III M.D.   On: 09/13/2016 14:08   Dg Chest Portable 1 View  Result Date: 09/05/2016 CLINICAL DATA:  Increased weakness and bilateral leg pain and cramping. EXAM: PORTABLE CHEST 1 VIEW COMPARISON:  05/17/2015 FINDINGS: The heart size and mediastinal contours are within normal limits. There is aortic atherosclerosis with slight uncoiling of the thoracic aorta. ICD device projects over the left hemithorax with leads in the right atrium, coronary sinus and right ventricle. Port catheter tip is seen at the cavoatrial junction. No pneumonic consolidation or CHF. Minimal atelectasis at the lung bases. The visualized skeletal structures are unremarkable. IMPRESSION: No acute cardiopulmonary disease. Aortic atherosclerosis with ICD in place. Electronically Signed   By: Ashley Royalty M.D.   On: 09/05/2016 18:22   Ct Angio Chest/abd/pel For Dissection W And/or W/wo  Result Date: 09/13/2016 CLINICAL DATA:  Lateral leg pain final chronic the worse today, BILATERAL lower extremity weakness and numbness question dissection, history hypertension, coronary artery disease post MI, cardiomyopathy, diabetes mellitus, COPD, prostate cancer, bladder cancer EXAM: CT ANGIOGRAPHY CHEST, ABDOMEN AND PELVIS TECHNIQUE: Multidetector CT imaging through the chest, abdomen and pelvis was performed using the standard protocol during bolus administration of intravenous contrast. Multiplanar reconstructed images and MIPs were obtained and reviewed to evaluate the vascular anatomy. CONTRAST:  100 cc Isovue 370 IV COMPARISON:  CT  abdomen pelvis 05/14/2016, CT chest 09/10/2008 FINDINGS: CTA CHEST FINDINGS Cardiovascular: Atherosclerotic calcifications aorta, proximal great vessels and minimally in coronary arteries. Beam hardening artifacts from pacemaker leads via LEFT subclavian approach extending into the RIGHT atrium and RIGHT ventricle as well as coronary sinus. Aneurysmal dilatation ascending thoracic aorta 4.3 cm transverse image 28. No intramural hemorrhage on precontrast images. Following contrast, normal aortic enhancement is seen without aortic dissection. Several artifacts are present at the inferior aortic arch, felt to be related to beam hardening artifacts from a combination of calcified aortic plaque, pacemaker generator in anterior LEFT chest wall and dense contrast/pacemaker leads in the SVC. However, filling defects are identified in RIGHT lower lobe pulmonary arteries consistent with pulmonary embolism. Additional RIGHT upper lobe pulmonary embolus anteriorly image 42. No definite LEFT sided emboli. RV/LV ratio = 0.8, normal Mediastinum/Nodes: Predominately air-filled esophagus throughout its length. Suspect small hiatal hernia. Base of cervical region unremarkable. No thoracic adenopathy. Lungs/Pleura: Dependent atelectasis in the posterior lungs. Underlying emphysematous changes. No infiltrate, pleural effusion or pneumothorax. 7 mm nodule in RIGHT middle lobe at at minor fissure image 30 unchanged. Musculoskeletal: No acute osseous findings. Review of the MIP images confirms the above findings. CTA ABDOMEN AND PELVIS FINDINGS VASCULAR Aorta: Atherosclerotic calcifications aorta with scattered thrombus. Aorta normal caliber without aneurysm or dissection. No periaortic hemorrhage or infiltration. Celiac: Atherosclerotic plaque at origin with less than 50% diameter stenosis SMA: Small amount of proximal SMA calcified plaque without significant stenosis Renals: Plaque at the origins of the renal arteries bilaterally with  probably less than 50% stenosis on the LEFT and greater 50% stenosis on the  RIGHT. IMA: Origin patent though arising between dense atherosclerotic plaques. Inflow: Scattered atherosclerotic plaques throughout the iliac and femoral systems without high-grade stenosis. Veins: Veins unopacified at time of CT imaging, patency not assessed. IVC filter noted infrarenal. Review of the MIP images confirms the above findings. NON-VASCULAR Hepatobiliary: Calcified gallstones within contracted gallbladder. Liver unremarkable Pancreas: Normal appearance Spleen: Normal appearance Adrenals/Urinary Tract: Thickening of adrenal glands without discrete mass. BILATERAL significant renal cortical atrophy without gross evidence of mass or hydronephrosis. Renal vascular calcifications. Bladder surgically absent with diversion urostomy in RIGHT lower quadrant. Nondilated ureters. Stomach/Bowel: Appendix surgically absent by history. Minimal sigmoid diverticulosis without evidence of diverticulitis. Stomach and bowel loops otherwise normal appearance. Lymphatic: No adenopathy Reproductive: Prostate gland surgically absent with nonvisualization of seminal vesicles. Other: No free air free fluid.  No hernia. Musculoskeletal: Demineralized with advanced degenerative disc disease changes at L4-L5 and significant facet degenerative changes at lower lumbar spine at multiple levels. Grade 1 anterolisthesis L4-L5 without spondylolysis. Review of the MIP images confirms the above findings. IMPRESSION: Extensive atherosclerotic calcifications including aorta, proximal great vessels, abdominal branch vessels, and iliofemoral systems as above, only minimally in coronary arteries. No evidence of aortic aneurysm or dissection. Filling defects within multiple RIGHT pulmonary arteries consistent with pulmonary emboli. Cholelithiasis. Minimal distal colonic diverticulosis. Stable RIGHT middle lobe pulmonary nodule at minor fissure since 2010 indicative of  a benign process. Findings called to Dr. Mable Paris on 09/15/2016 at 1354 hr. Electronically Signed   By: Lavonia Dana M.D.   On: 09/10/2016 13:56    ASSESSMENT / PLAN: ?Septic shock with hypotension unclear source  Multiple Right Lower Lobe Pulmonary Emboli revealed on CT Angio Chest 06/1 Bilateral Lower Extremity Pain with Cyanosis secondary to PAD Lactic Acidosis Leukocytosis Mildly elevated troponin's Acute on chronic renal failure  Hx: Recurrent Bladder Cancer, Combined Systolic and Diastolic Heart Failure-EF 25%, AICD, and Diabetes Mellitus  P: Supplemental O2 to maintain O2 sats >92% Prn bronchodilator therapy Heparin gtt dosing per pharmacy Trend troponin's Continuous telemetry monitoring Echo pending Prn levophed gtt to maintain map >65  US Venous Img Lower Bilateral pending  Trend WBC and monitor fever curve Follow cultures Empiric abx Trend BMP Replace electrolytes as indicated  Monitor uop Nephrology and Vascular consulted appreciate input  NS @75ml /hr  Prn morphine and tramadol for pain management  Per Vascular surgery angiogram may be considered at some point in the future or an outpatient workup vascular surgery will hold off on immediate intervention at this time  CBG's ac/hs and SSI  Marda Stalker, [place] Pager [phone] (please enter 7 digits) PCCM Consult Pager [phone] (please enter 7 digits)  Patient seen and examined with NP, agree with findings, assessment, plan, as amended above.  Patient is an 83 year old male with a known history of coronary artery disease, history of AICD placement, EF of 25%, history of COPD. Patient presented with severe bilateral leg cramps, it was noted that his legs were called and clammy. He was seen by vascular surgery, no acute intervention was required at that time. He continues to complain of severe lower extremity pain. He underwent CT of the lung, abdomen and pelvis, I personally review these  images, this shows evidence of a right-sided pulmonary embolism. He also has significant lactic acidosis concerning for sepsis, he was given IV fluids, his course was further, complicated by acute on chronic kidney disease. He also has a history of recurrent bladder cancer, which is apparently in remission.  Continue broad-spectrum antibiotics, will  continue Levophed, and wean down as tolerated. Prognosis is guarded.  Marda Stalker, M.D. 2016/10/04

## 2016-09-06 NOTE — Progress Notes (Signed)
[facility] physician notified and acknowledged. No new orders at this time.

## 2016-09-07 ENCOUNTER — Inpatient Hospital Stay: Payer: Medicare HMO

## 2016-09-07 DIAGNOSIS — I2699 Other pulmonary embolism without acute cor pulmonale: Secondary | ICD-10-CM

## 2016-09-07 DIAGNOSIS — N179 Acute kidney failure, unspecified: Secondary | ICD-10-CM

## 2016-09-07 DIAGNOSIS — R579 Shock, unspecified: Secondary | ICD-10-CM

## 2016-09-07 LAB — BLOOD CULTURE ID PANEL (REFLEXED)
Acinetobacter baumannii: NOT DETECTED
CANDIDA ALBICANS: NOT DETECTED
CANDIDA GLABRATA: NOT DETECTED
Candida krusei: NOT DETECTED
Candida parapsilosis: NOT DETECTED
Candida tropicalis: NOT DETECTED
ENTEROBACTER CLOACAE COMPLEX: NOT DETECTED
ENTEROBACTERIACEAE SPECIES: NOT DETECTED
ENTEROCOCCUS SPECIES: NOT DETECTED
Escherichia coli: NOT DETECTED
Haemophilus influenzae: NOT DETECTED
Klebsiella oxytoca: NOT DETECTED
Klebsiella pneumoniae: NOT DETECTED
LISTERIA MONOCYTOGENES: NOT DETECTED
METHICILLIN RESISTANCE: NOT DETECTED
NEISSERIA MENINGITIDIS: NOT DETECTED
Proteus species: NOT DETECTED
Pseudomonas aeruginosa: NOT DETECTED
STAPHYLOCOCCUS SPECIES: DETECTED — AB
STREPTOCOCCUS AGALACTIAE: NOT DETECTED
STREPTOCOCCUS PYOGENES: NOT DETECTED
STREPTOCOCCUS SPECIES: NOT DETECTED
Serratia marcescens: NOT DETECTED
Staphylococcus aureus (BCID): NOT DETECTED
Streptococcus pneumoniae: NOT DETECTED

## 2016-09-07 LAB — CBC
HCT: 45 % (ref 40.0–52.0)
HEMOGLOBIN: 14.8 g/dL (ref 13.0–18.0)
MCH: 32.9 pg (ref 26.0–34.0)
MCHC: 33 g/dL (ref 32.0–36.0)
MCV: 99.7 fL (ref 80.0–100.0)
PLATELETS: 147 10*3/uL — AB (ref 150–440)
RBC: 4.51 MIL/uL (ref 4.40–5.90)
RDW: 13.7 % (ref 11.5–14.5)
WBC: 22.5 10*3/uL — ABNORMAL HIGH (ref 3.8–10.6)

## 2016-09-07 LAB — BASIC METABOLIC PANEL
ANION GAP: 14 (ref 5–15)
BUN: 55 mg/dL — ABNORMAL HIGH (ref 6–20)
CALCIUM: 8.4 mg/dL — AB (ref 8.9–10.3)
CO2: 15 mmol/L — AB (ref 22–32)
CREATININE: 4.85 mg/dL — AB (ref 0.61–1.24)
Chloride: 103 mmol/L (ref 101–111)
GFR, EST AFRICAN AMERICAN: 12 mL/min — AB (ref >60)
GFR, EST NON AFRICAN AMERICAN: 10 mL/min — AB (ref >60)
GLUCOSE: 243 mg/dL — AB (ref 65–99)
Potassium: 6 mmol/L — ABNORMAL HIGH (ref 3.5–5.1)
Sodium: 132 mmol/L — ABNORMAL LOW (ref 135–145)

## 2016-09-07 LAB — TROPONIN I
TROPONIN I: 0.13 ng/mL — AB (ref <0.03)
Troponin I: 0.11 ng/mL (ref <0.03)

## 2016-09-07 LAB — ECHOCARDIOGRAM COMPLETE
Height: 72 in
WEIGHTICAEL: 2917.13 [oz_av]

## 2016-09-07 LAB — GLUCOSE, CAPILLARY: Glucose-Capillary: 239 mg/dL — ABNORMAL HIGH (ref 65–99)

## 2016-09-07 LAB — HEPARIN LEVEL (UNFRACTIONATED): Heparin Unfractionated: <0.1 IU/mL — ABNORMAL LOW (ref 0.30–0.70)

## 2016-09-07 MED ORDER — FENTANYL CITRATE (PF) 100 MCG/2ML IJ SOLN
INTRAMUSCULAR | Status: AC
  Filled 2016-09-07: qty 2

## 2016-09-07 MED ORDER — HALOPERIDOL 0.5 MG PO TABS
0.5000 mg | ORAL_TABLET | ORAL | Status: DC | PRN
  Filled 2016-09-07: qty 1

## 2016-09-07 MED ORDER — HALOPERIDOL LACTATE 5 MG/ML IJ SOLN: 0.5000 mg | INTRAMUSCULAR | Status: DC | PRN

## 2016-09-07 MED ORDER — AMIODARONE IV BOLUS ONLY 150 MG/100ML
150.0000 mg | Freq: Once | INTRAVENOUS | Status: AC
  Administered 2016-09-07: 150 mg via INTRAVENOUS
  Filled 2016-09-07: qty 100

## 2016-09-07 MED ORDER — GLYCOPYRROLATE 0.2 MG/ML IJ SOLN: 0.2000 mg | INTRAMUSCULAR | Status: DC | PRN

## 2016-09-07 MED ORDER — MIDAZOLAM HCL 2 MG/2ML IJ SOLN
2.0000 mg | INTRAMUSCULAR | Status: DC | PRN
  Administered 2016-09-07: 2 mg via INTRAVENOUS

## 2016-09-07 MED ORDER — ROCURONIUM BROMIDE 50 MG/5ML IV SOLN
50.0000 mg | Freq: Once | INTRAVENOUS | Status: AC
  Administered 2016-09-07: 50 mg via INTRAVENOUS

## 2016-09-07 MED ORDER — CHLORHEXIDINE GLUCONATE 0.12% ORAL RINSE (MEDLINE KIT)
15.0000 mL | Freq: Two times a day (BID) | OROMUCOSAL | Status: DC
  Administered 2016-09-07: 15 mL via OROMUCOSAL

## 2016-09-07 MED ORDER — POLYVINYL ALCOHOL 1.4 % OP SOLN
1.0000 [drp] | Freq: Four times a day (QID) | OPHTHALMIC | Status: DC | PRN
  Filled 2016-09-07: qty 15

## 2016-09-07 MED ORDER — MORPHINE BOLUS VIA INFUSION
2.0000 mg | INTRAVENOUS | Status: DC | PRN
  Filled 2016-09-07: qty 4

## 2016-09-07 MED ORDER — HEPARIN BOLUS VIA INFUSION
2500.0000 [IU] | Freq: Once | INTRAVENOUS | Status: AC
  Administered 2016-09-07: 2500 [IU] via INTRAVENOUS
  Filled 2016-09-07: qty 2500

## 2016-09-07 MED ORDER — FENTANYL CITRATE (PF) 100 MCG/2ML IJ SOLN
50.0000 ug | Freq: Once | INTRAMUSCULAR | Status: AC
  Administered 2016-09-07: 50 ug via INTRAVENOUS

## 2016-09-07 MED ORDER — ROCURONIUM BROMIDE 50 MG/5ML IV SOLN
INTRAVENOUS | Status: AC
  Filled 2016-09-07: qty 2

## 2016-09-07 MED ORDER — ONDANSETRON HCL 4 MG/2ML IJ SOLN: 4.0000 mg | Freq: Four times a day (QID) | INTRAMUSCULAR | Status: DC | PRN

## 2016-09-07 MED ORDER — ONDANSETRON 4 MG PO TBDP
4.0000 mg | ORAL_TABLET | Freq: Four times a day (QID) | ORAL | Status: DC | PRN
  Filled 2016-09-07: qty 1

## 2016-09-07 MED ORDER — MIDAZOLAM HCL 2 MG/2ML IJ SOLN
INTRAMUSCULAR | Status: AC
  Filled 2016-09-07: qty 2

## 2016-09-07 MED ORDER — GLYCOPYRROLATE 1 MG PO TABS: 1.0000 mg | ORAL_TABLET | ORAL | Status: DC | PRN

## 2016-09-07 MED ORDER — MIDAZOLAM HCL 2 MG/2ML IJ SOLN
2.0000 mg | Freq: Once | INTRAMUSCULAR | Status: AC
  Administered 2016-09-07: 2 mg via INTRAVENOUS

## 2016-09-07 MED ORDER — ORAL CARE MOUTH RINSE: 15.0000 mL | OROMUCOSAL | Status: DC

## 2016-09-07 MED ORDER — MORPHINE 100MG IN NS 100ML (1MG/ML) PREMIX INFUSION
2.0000 mg/h | INTRAVENOUS | Status: DC
  Administered 2016-09-07: 2 mg/h via INTRAVENOUS
  Filled 2016-09-07: qty 100

## 2016-09-07 MED ORDER — SODIUM CHLORIDE 0.9 % IV SOLN
2.0000 mg/h | INTRAVENOUS | Status: DC
  Filled 2016-09-07: qty 10

## 2016-09-07 MED ORDER — MORPHINE 100MG IN NS 100ML (1MG/ML) PREMIX INFUSION: 2.0000 mg/h | INTRAVENOUS | Status: DC

## 2016-09-07 MED ORDER — MORPHINE 100MG IN NS 100ML (1MG/ML) PREMIX INFUSION: 5.0000 mg/h | INTRAVENOUS | Status: DC

## 2016-09-07 MED ORDER — HALOPERIDOL LACTATE 2 MG/ML PO CONC
0.5000 mg | ORAL | Status: DC | PRN
  Filled 2016-09-07: qty 0.3

## 2016-09-07 MED ORDER — ALBUTEROL SULFATE (2.5 MG/3ML) 0.083% IN NEBU: 2.5000 mg | INHALATION_SOLUTION | RESPIRATORY_TRACT | Status: DC | PRN

## 2016-09-07 MED FILL — Medication: Qty: 1 | Status: AC

## 2016-09-08 LAB — URINE CULTURE

## 2016-09-09 LAB — CULTURE, BLOOD (ROUTINE X 2)

## 2016-09-09 LAB — GLUCOSE, CAPILLARY: Glucose-Capillary: 252 mg/dL — ABNORMAL HIGH (ref 65–99)

## 2016-09-10 ENCOUNTER — Telehealth: Payer: Self-pay | Admitting: Internal Medicine

## 2016-09-10 NOTE — Telephone Encounter (Signed)
Placed in DR's folder for him to sign per DK.

## 2016-09-10 NOTE — Telephone Encounter (Signed)
Received Death Certificate from [place]. Placed in nurse box.  Please call when ready for pick up

## 2016-09-11 LAB — CULTURE, BLOOD (ROUTINE X 2): CULTURE: NO GROWTH

## 2016-09-12 NOTE — Telephone Encounter (Signed)
[facility] and made them aware death certificate ready for pick up.

## 2016-09-26 IMAGING — CT CT ABD-PELV W/O CM
1 of 2 series · 14 of 32 positions shown, 18 images · non-contrast
Comparison: 09/21/2015

CLINICAL DATA: Restaging bladder cancer. History of prostatectomy.
Prior appendectomy.

EXAM:
CT ABDOMEN AND PELVIS WITHOUT CONTRAST
TECHNIQUE: Multidetector CT imaging of the abdomen and pelvis was performed
following the standard protocol without IV contrast.

[Series 2: axial st · axial · 0.79mm/px · z∈[-670,-245]mm · 14 of 97 slices shown, 18 images]
[im 8/97  soft-tissue]
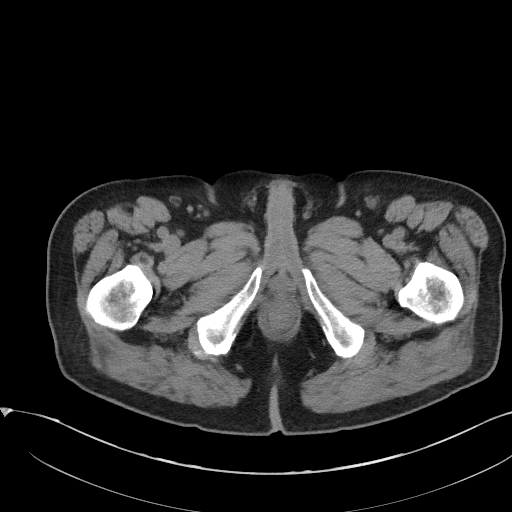
[im 8/97  bone]
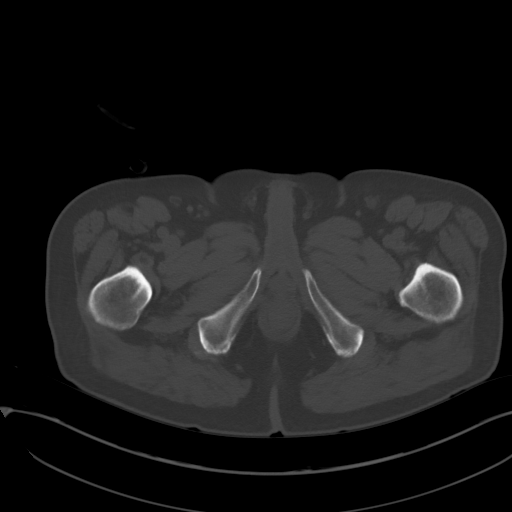
[im 16/97  soft-tissue]
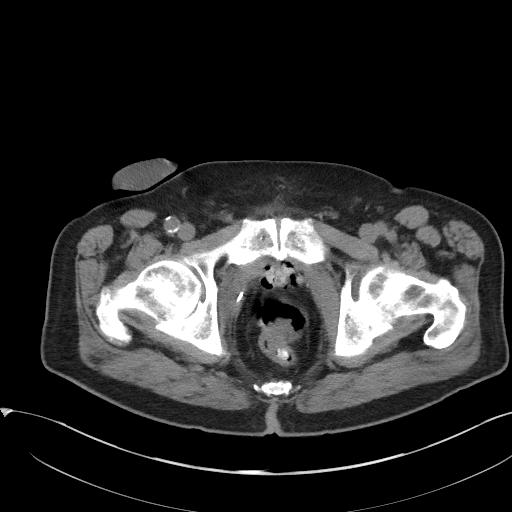
[im 24/97  soft-tissue]
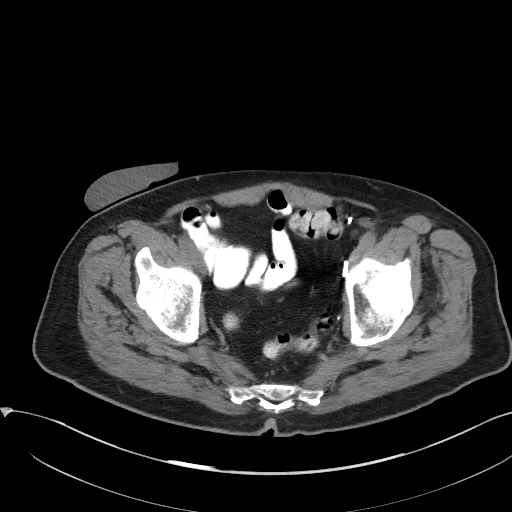
[im 31/97  soft-tissue]
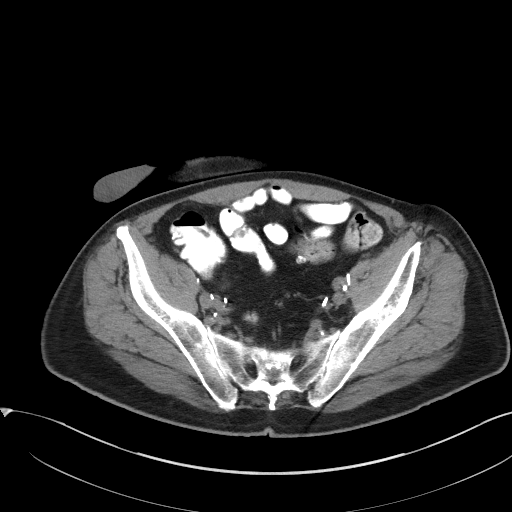
[im 39/97  soft-tissue]
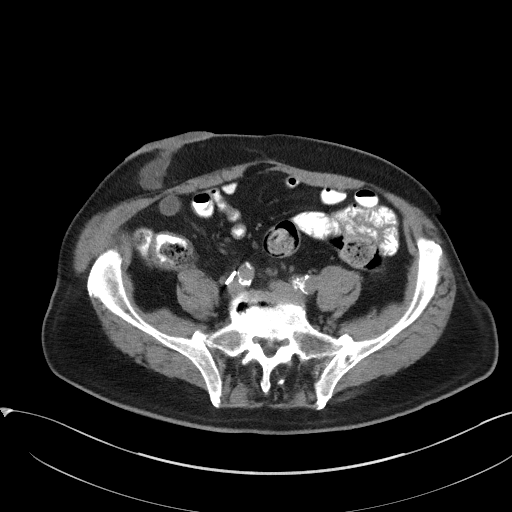
[im 47/97  soft-tissue]
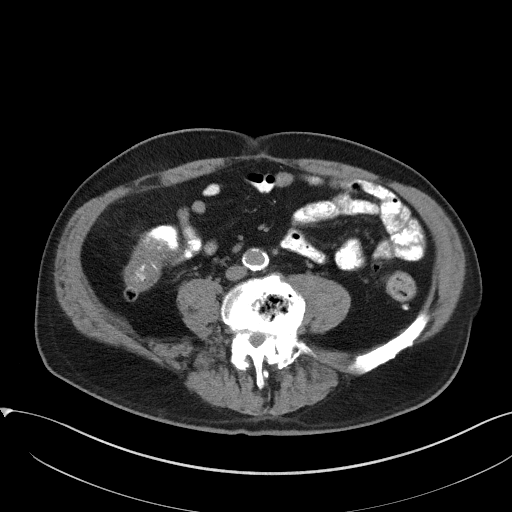
[im 54/97  soft-tissue]
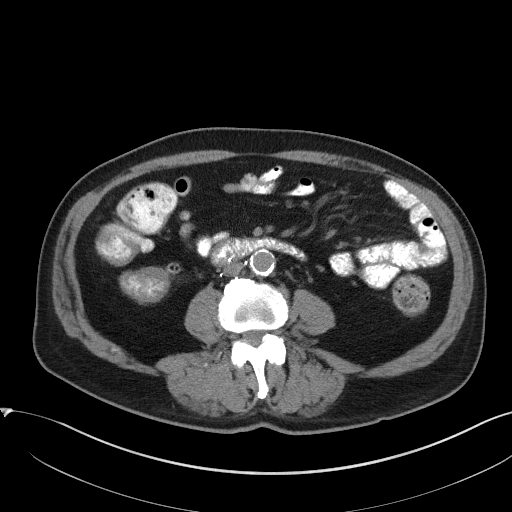
[im 62/97  soft-tissue]
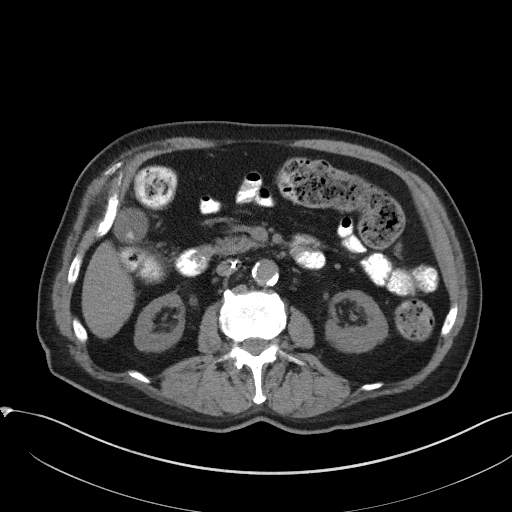
[im 70/97  soft-tissue]
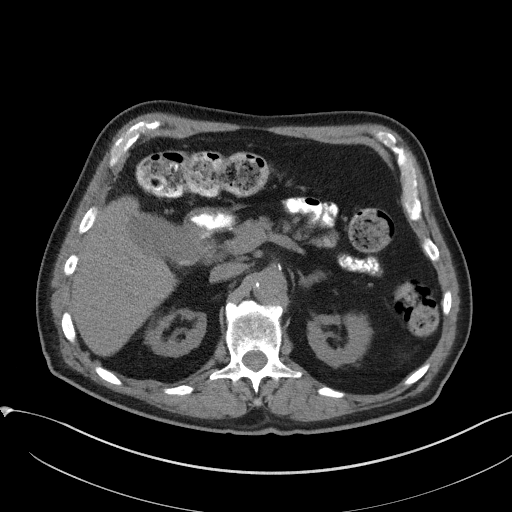
[im 70/97  bone]
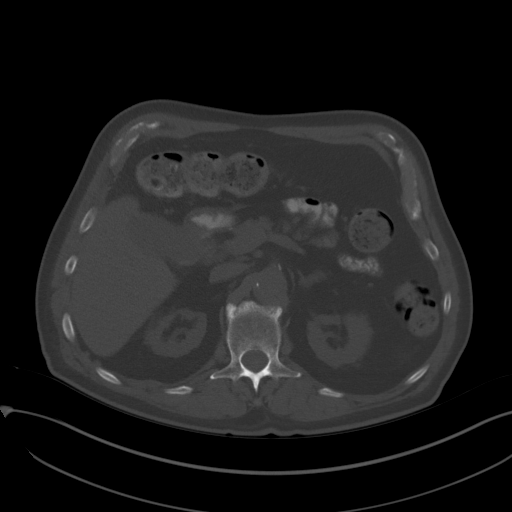
[im 77/97  soft-tissue]
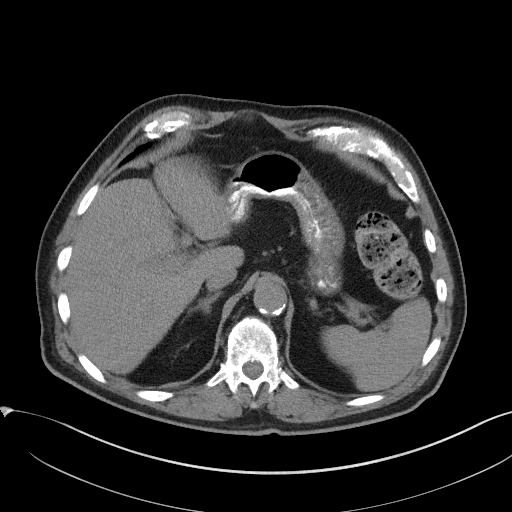
[im 81/97  lung]
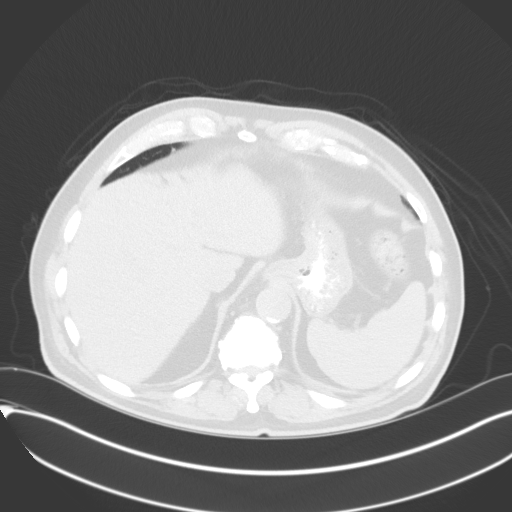
[im 85/97  soft-tissue]
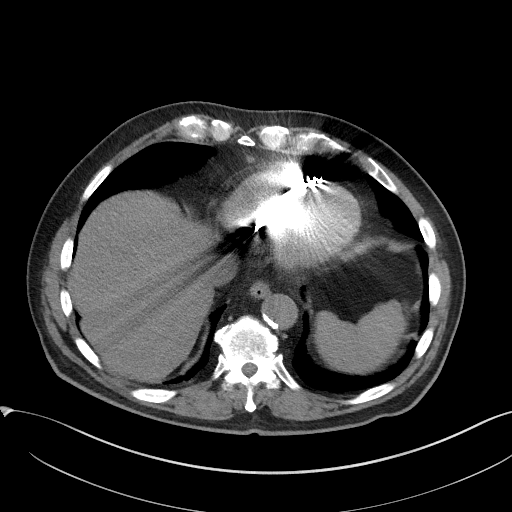
[im 85/97  lung]
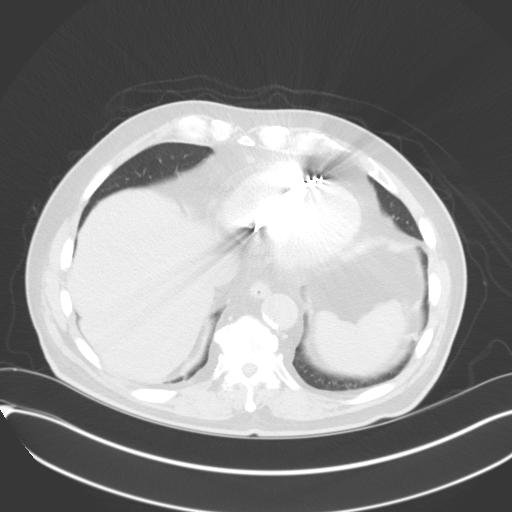
[im 89/97  lung]
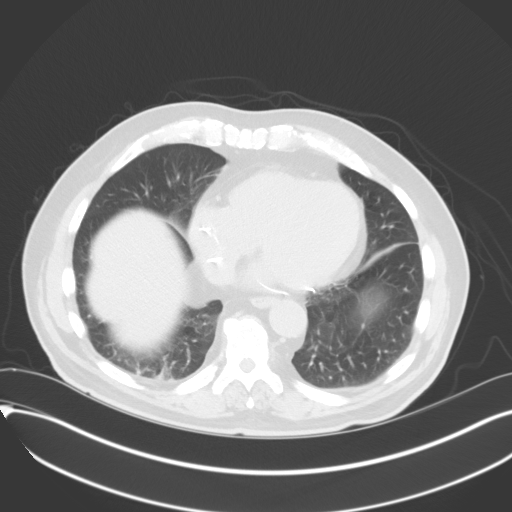
[im 93/97  soft-tissue]
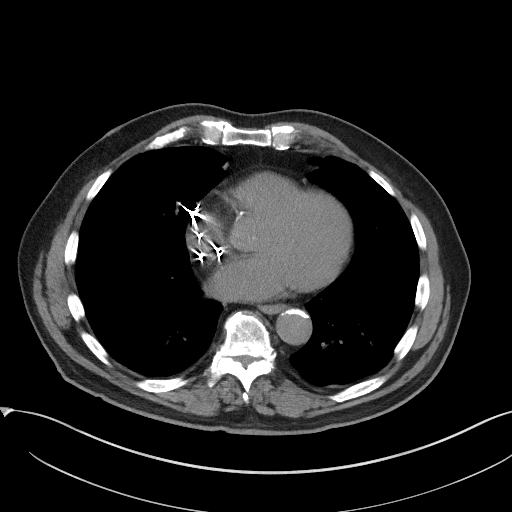
[im 93/97  lung]
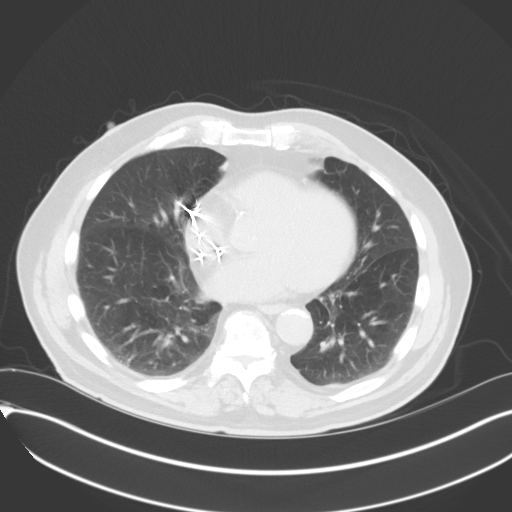

[14 of 32 positions shown; findings below may reference images not displayed]

FINDINGS: Lower chest:  Compressive atelectasis lung bases.

Hepatobiliary: No focal abnormality in the liver on this study
without intravenous contrast. No evidence of hepatomegaly. Calcified
stones noted in the gallbladder. No intrahepatic or extrahepatic
biliary dilation.

Pancreas: No focal mass lesion. No dilatation of the main duct. No
intraparenchymal cyst. No peripancreatic edema.

Spleen: No splenomegaly. No focal mass lesion.

Adrenals/Urinary Tract: No adrenal nodule or mass. Kidneys are
atrophic. 6 mm nonobstructing stone identified interpolar left
kidney. No hydronephrosis or hydroureter. Right lower quadrant ileal
conduit is nondistended. There is some mild parastomal fat
herniation at the urostomy.

Stomach/Bowel: Stomach is nondistended. No gastric wall thickening.
No evidence of outlet obstruction. Duodenum is normally positioned
as is the ligament of Treitz. No small bowel wall thickening. No
small bowel dilatation. Peristalsis noted right colon given normal
appearance on CT 6 weeks ago. Diverticuli are seen scattered along
the entire length of the colon without CT findings of
diverticulitis.

Vascular/Lymphatic: There is abdominal aortic atherosclerosis
without aneurysm. IVC filter noted in situ There is no gastrohepatic
or hepatoduodenal ligament lymphadenopathy. No intraperitoneal or
retroperitoneal lymphadenopathy. No pelvic sidewall lymphadenopathy.

Reproductive: Prostate gland surgically absent.

Other: No intraperitoneal free fluid.

Musculoskeletal: Bone windows reveal no worrisome lytic or sclerotic
osseous lesions.
IMPRESSION: 1. Edema/inflammation seen on the gallbladder on the previous study
has almost completely resolved in the interval. Gallstones are still
evident.
2. Status post cystoprostatectomy with right total quadrant ileal
conduit. No complicating features.
3. Nonobstructing left renal stone.
4. No lymphadenopathy in the abdomen or pelvis.

## 2016-10-06 NOTE — Progress Notes (Signed)
Name: Bryce Clark MRN: [id number] DOB: 1933-02-13    ADMISSION DATE:  10/03/2016   BRIEF PATIENT DESCRIPTION:  83 yo male admitted 06/1 with multiple right lower lobe pulmonary emboli, bilateral lower extremity pain with cyanosis secondary to PAD, ?septic shock with hypotension unclear source, leukocytosis, and lactic acidosis  SIGNIFICANT EVENTS  06/1-Pt admitted to ICU  06/2-Intubated.   STUDIES:  CT Angio Chest/Abd/Pelvis 06/1>>Extensive atherosclerotic calcifications including aorta, proximal great vessels, abdominal branch vessels, and iliofemoral systems as above, only minimally in coronary arteries. No evidence of aortic aneurysm or dissection. Filling defects within multiple RIGHT pulmonary arteries consistent with pulmonary emboli. Cholelithiasis. Minimal distal colonic diverticulosis. Stable RIGHT middle lobe pulmonary nodule at minor fissure since 2010 indicative of a benign process.  Subjective:  Patient developed respiratory distress, he was intubated early this morning. He is currently on the ventilator and sedated, minimally responsive. Review of systems could not be obtained.  PAST MEDICAL HISTORY :    Allergies  Allergen Reactions  . Xarelto [Rivaroxaban] Other (See Comments)    Reaction:  Bleeding   . Azithromycin Other (See Comments)    Reaction:  Cold sweats   . Codeine Nausea And Vomiting  . Oxycodone-Acetaminophen Nausea And Vomiting    REVIEW OF SYSTEMS:  Positives in BOLD Could not be obtained due to critical illness, patient is on a ventilator.    VITAL SIGNS: Temp:  [97.5 F (36.4 C)-97.9 F (36.6 C)] 97.6 F (36.4 C) (06/02 0700) Pulse Rate:  [33-119] 50 (06/02 1000) Resp:  [9-32] 21 (06/02 1000) BP: (51-185)/(41-100) 86/61 (06/02 1000) SpO2:  [86 %-100 %] 96 % (06/02 1000) FiO2 (%):  [40 %] 40 % (06/02 0737) Weight:  [182 lb 5.1 oz (82.7 kg)] 182 lb 5.1 oz (82.7 kg) (06/01 1615)  PHYSICAL EXAMINATION: General: well developed,  well nourished Caucasian male  Neuro: alert and oriented, sedated, minimally responsive. HEENT: supple, mild JVD Cardiovascular: vpaced, no M/R/G Lungs: diminished throughout, mildly labored and tachypneic  Abdomen: +BS x4, soft, non tender, non distended  Musculoskeletal: moves all extremities, 2+ bilateral lower extremity edema, palpable bilateral femoral pulses, unable to doppler distal tibial pulses, bilateral lower extremities cyanotic and cool to touch  Skin: intact no rashes or lesions   Recent Labs Lab 09/05/16 1755 10/04/2016 1226 Sep 13, 2016 0600  NA 136 133* 132*  K 4.5 5.1 6.0*  CL 99* 97* 103  CO2 25 17* 15*  BUN 33* 48* 55*  CREATININE 2.31* 4.10* 4.85*  GLUCOSE 156* 270* 243*    Recent Labs Lab 09/05/16 1755 09/18/2016 1226 2016-09-13 0600  HGB 15.4 16.0 14.8  HCT 46.5 48.9 45.0  WBC 13.0* 20.6* 22.5*  PLT 167 168 147*   Dg Chest Port 1 View  Result Date: 09/13/2016 CLINICAL DATA:  ET tube placement Hx - CAD, cardiomyopathy, CHF, COPD, diabetes, MI, AICD, bladder cancer, prostate cancer EXAM: PORTABLE CHEST 1 VIEW COMPARISON:  Chest x-ray from earlier same day FINDINGS: Carina is not well visualized on this chest x-ray. Based on earlier chest x-rays, the endotracheal tube tip is likely located approximately 5-10 mm above the level of the carina. Left chest wall pacemaker appears stable in position. Right chest wall Port-A-Cath is stable in position with tip appropriately positioned at the level of the lower SVC. Heart size and mediastinal contours are stable. Atherosclerotic changes noted at the aortic arch. Lungs are clear. No pleural effusion or pneumothorax seen. IMPRESSION: 1. Endotracheal tube has been placed. Carina is not well seen on  this chest x-ray. Based on earlier chest x-rays, I believe the tip of the endotracheal tube is in satisfactory position located slightly above the level of the carina. 2. Lungs are clear.  No evidence of pneumonia or pulmonary edema. 3.  Aortic atherosclerosis. Electronically Signed   By: Franki Cabot M.D.   On: 09-08-2016 08:09   Dg Chest Port 1 View  Result Date: 10/04/2016 CLINICAL DATA:  Hypotension. Prostate carcinoma. Pulmonary embolus. EXAM: PORTABLE CHEST 1 VIEW COMPARISON:  Chest radiograph Sep 05, 2016.  Chest CT September 06, 2016 FINDINGS: There is no edema or consolidation. Pacemaker leads are attached to the right atrium, right ventricle, and coronary sinus. Heart is upper normal in size with pulmonary vascularity within normal limits. Port-A-Cath tip is in the superior vena cava. No pneumothorax. There is aortic atherosclerosis. No evident bone lesions. No adenopathy appreciable by radiography. IMPRESSION: No edema or consolidation. Stable cardiac silhouette. There is aortic atherosclerosis. Electronically Signed   By: Lowella Grip III M.D.   On: 09/09/2016 14:08   Dg Chest Portable 1 View  Result Date: 09/05/2016 CLINICAL DATA:  Increased weakness and bilateral leg pain and cramping. EXAM: PORTABLE CHEST 1 VIEW COMPARISON:  05/17/2015 FINDINGS: The heart size and mediastinal contours are within normal limits. There is aortic atherosclerosis with slight uncoiling of the thoracic aorta. ICD device projects over the left hemithorax with leads in the right atrium, coronary sinus and right ventricle. Port catheter tip is seen at the cavoatrial junction. No pneumonic consolidation or CHF. Minimal atelectasis at the lung bases. The visualized skeletal structures are unremarkable. IMPRESSION: No acute cardiopulmonary disease. Aortic atherosclerosis with ICD in place. Electronically Signed   By: Ashley Royalty M.D.   On: 09/05/2016 18:22   Ct Angio Chest/abd/pel For Dissection W And/or W/wo  Result Date: 10/03/2016 CLINICAL DATA:  Lateral leg pain final chronic the worse today, BILATERAL lower extremity weakness and numbness question dissection, history hypertension, coronary artery disease post MI, cardiomyopathy, diabetes mellitus,  COPD, prostate cancer, bladder cancer EXAM: CT ANGIOGRAPHY CHEST, ABDOMEN AND PELVIS TECHNIQUE: Multidetector CT imaging through the chest, abdomen and pelvis was performed using the standard protocol during bolus administration of intravenous contrast. Multiplanar reconstructed images and MIPs were obtained and reviewed to evaluate the vascular anatomy. CONTRAST:  100 cc Isovue 370 IV COMPARISON:  CT abdomen pelvis 05/14/2016, CT chest 09/10/2008 FINDINGS: CTA CHEST FINDINGS Cardiovascular: Atherosclerotic calcifications aorta, proximal great vessels and minimally in coronary arteries. Beam hardening artifacts from pacemaker leads via LEFT subclavian approach extending into the RIGHT atrium and RIGHT ventricle as well as coronary sinus. Aneurysmal dilatation ascending thoracic aorta 4.3 cm transverse image 28. No intramural hemorrhage on precontrast images. Following contrast, normal aortic enhancement is seen without aortic dissection. Several artifacts are present at the inferior aortic arch, felt to be related to beam hardening artifacts from a combination of calcified aortic plaque, pacemaker generator in anterior LEFT chest wall and dense contrast/pacemaker leads in the SVC. However, filling defects are identified in RIGHT lower lobe pulmonary arteries consistent with pulmonary embolism. Additional RIGHT upper lobe pulmonary embolus anteriorly image 42. No definite LEFT sided emboli. RV/LV ratio = 0.8, normal Mediastinum/Nodes: Predominately air-filled esophagus throughout its length. Suspect small hiatal hernia. Base of cervical region unremarkable. No thoracic adenopathy. Lungs/Pleura: Dependent atelectasis in the posterior lungs. Underlying emphysematous changes. No infiltrate, pleural effusion or pneumothorax. 7 mm nodule in RIGHT middle lobe at at minor fissure image 30 unchanged. Musculoskeletal: No acute osseous findings. Review of the  MIP images confirms the above findings. CTA ABDOMEN AND PELVIS  FINDINGS VASCULAR Aorta: Atherosclerotic calcifications aorta with scattered thrombus. Aorta normal caliber without aneurysm or dissection. No periaortic hemorrhage or infiltration. Celiac: Atherosclerotic plaque at origin with less than 50% diameter stenosis SMA: Small amount of proximal SMA calcified plaque without significant stenosis Renals: Plaque at the origins of the renal arteries bilaterally with probably less than 50% stenosis on the LEFT and greater 50% stenosis on the RIGHT. IMA: Origin patent though arising between dense atherosclerotic plaques. Inflow: Scattered atherosclerotic plaques throughout the iliac and femoral systems without high-grade stenosis. Veins: Veins unopacified at time of CT imaging, patency not assessed. IVC filter noted infrarenal. Review of the MIP images confirms the above findings. NON-VASCULAR Hepatobiliary: Calcified gallstones within contracted gallbladder. Liver unremarkable Pancreas: Normal appearance Spleen: Normal appearance Adrenals/Urinary Tract: Thickening of adrenal glands without discrete mass. BILATERAL significant renal cortical atrophy without gross evidence of mass or hydronephrosis. Renal vascular calcifications. Bladder surgically absent with diversion urostomy in RIGHT lower quadrant. Nondilated ureters. Stomach/Bowel: Appendix surgically absent by history. Minimal sigmoid diverticulosis without evidence of diverticulitis. Stomach and bowel loops otherwise normal appearance. Lymphatic: No adenopathy Reproductive: Prostate gland surgically absent with nonvisualization of seminal vesicles. Other: No free air free fluid.  No hernia. Musculoskeletal: Demineralized with advanced degenerative disc disease changes at L4-L5 and significant facet degenerative changes at lower lumbar spine at multiple levels. Grade 1 anterolisthesis L4-L5 without spondylolysis. Review of the MIP images confirms the above findings. IMPRESSION: Extensive atherosclerotic calcifications  including aorta, proximal great vessels, abdominal branch vessels, and iliofemoral systems as above, only minimally in coronary arteries. No evidence of aortic aneurysm or dissection. Filling defects within multiple RIGHT pulmonary arteries consistent with pulmonary emboli. Cholelithiasis. Minimal distal colonic diverticulosis. Stable RIGHT middle lobe pulmonary nodule at minor fissure since 2010 indicative of a benign process. Findings called to Dr. Mable Paris on 10/01/2016 at 1354 hr. Electronically Signed   By: Lavonia Dana M.D.   On: 09/21/2016 13:56    ASSESSMENT / PLAN: Septic shock with hypotension unclear source  Multiple Right Lower Lobe Pulmonary Emboli revealed on CT Angio Chest 06/1; bilateral LE dopplers negative.  Bilateral Lower Extremity Pain with Cyanosis secondary to PAD Acute renal failure on chronic kidney disease, with hyperkalemia. 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  I personally reviewed CXR images which was unremarkable.   I discussed the patient's status with his nephew who is his HCPOA. Explained that patient is now in multi-organ failure with respiratory failure, oliguric renal failure. His chance for recovery is poor.  He consent to change to DNR status and possible change to comfort measures when further family arrives.    Marda Stalker, M.D. 2016/09/26   Critical Care Attestation.  I have personally obtained a history, examined the patient, evaluated laboratory and imaging results, formulated the assessment  and plan and placed orders. The Patient requires high complexity decision making for assessment and support, frequent evaluation and titration of therapies, application of advanced monitoring technologies and extensive interpretation of multiple databases. The patient has critical illness that could lead imminently to  failure of 1 or more organ systems and requires the highest level of physician preparedness to intervene.  Critical Care Time devoted to patient care services described in this note is 35 minutes and is exclusive of time spent in procedures.

## 2016-10-06 NOTE — Progress Notes (Signed)
Pt with periods of VT on monitor, doppler carotid present. Nephew notified by Dr Ashby Dawes pt not doing well, made DNR/NMP. Chaplain at bedside.

## 2016-10-06 NOTE — Progress Notes (Signed)
[place] [facility]                                                                                                                                                                                  Patient Demographics   Bryce Clark, is a 83 y.o. male, DOB - July 18, 1932, GYJ:[id number]  Admit date - 09/21/2016   Admitting Physician Bettey Costa, MD  Outpatient Primary MD for the patient is Cletis Athens, MD   LOS - 1  Subjective: Pt admited with bilateral leg pain  patient needed to be intubated Currently on the ventilator    Review of Systems:   CONSTITUTIONAL:intubated  Vitals:   Vitals:   2016-10-03 0925 10/03/2016 0930 2016-10-03 1000 10/03/16 1200  BP: (!) 51/43 (!) 72/50 (!) 86/61   Pulse:   (!) 50 (!) 50  Resp: 15 (!) 22 (!) 21 14  Temp:      TempSrc:      SpO2:   96% (!) 72%  Weight:      Height:        Wt Readings from Last 3 Encounters:  10/02/2016 182 lb 5.1 oz (82.7 kg)  09/05/16 180 lb (81.6 kg)  06/17/16 170 lb (77.1 kg)     Intake/Output Summary (Last 24 hours) at 2016-10-03 1500 Last data filed at 2016/10/03 1000  Gross per 24 hour  Intake          1925.81 ml  Output              200 ml  Net          1725.81 ml    Physical Exam:   GENERAL:critically ill intubated HEAD, EYES, EARS, NOSE AND THROAT: Atraumatic, normocephalic. Extraocular muscles are intact. Pupils equal and reactive to light. Sclerae anicteric. No conjunctival injection. No oro-pharyngeal erythema.  NECK: Supple. There is no jugular venous distention. No bruits, no lymphadenopathy, no thyromegaly.  HEART: Regular rate and rhythm,. No murmurs, no rubs, no clicks.  LUNGS:decreased breath sounds on the vent ABDOMEN: Soft, flat, nontender, nondistended. Has good bowel sounds. No hepatosplenomegaly appreciated.  EXTREMITIES: No evidence of any cyanosis, clubbing, or peripheral edema.  +2 pedal and radial pulses bilaterally.  NEUROLOGIC: intubated SKIN: Moist and  warm with no rashes appreciated.  Psych: Not anxious, depressed LN: No inguinal LN enlargement    Antibiotics   Anti-infectives    Start     Dose/Rate Route Frequency Ordered Stop   10/03/16 0200  piperacillin-tazobactam (ZOSYN) IVPB 3.375 g  Status:  Discontinued     3.375 g 12.5 mL/hr over 240 Minutes Intravenous Every 12 hours 09/19/2016 1520 03-Oct-2016 1106   09/06/16 1645  vancomycin (VANCOCIN) IVPB 1000 mg/200 mL premix     1,000 mg 200 mL/hr over 60 Minutes Intravenous  Once 09/23/2016 1628 10/04/2016 1825   09/19/2016 1530  vancomycin (VANCOCIN) IVPB 1000 mg/200 mL premix  Status:  Discontinued     1,000 mg 200 mL/hr over 60 Minutes Intravenous  Once 09/20/2016 1520 09/16/2016 1628   09/30/2016 1345  piperacillin-tazobactam (ZOSYN) IVPB 3.375 g     3.375 g 100 mL/hr over 30 Minutes Intravenous  Once 10/03/2016 1331 09/15/2016 1423   09/14/2016 1345  vancomycin (VANCOCIN) IVPB 1000 mg/200 mL premix     1,000 mg 200 mL/hr over 60 Minutes Intravenous  Once 09/14/2016 1331 09/13/2016 1527      Medications   Scheduled Meds: Continuous Infusions: . morphine     PRN Meds:.acetaminophen **OR** acetaminophen, glycopyrrolate **OR** glycopyrrolate **OR** glycopyrrolate, haloperidol **OR** haloperidol **OR** haloperidol lactate, midazolam, morphine injection, morphine, ondansetron **OR** ondansetron (ZOFRAN) IV, ondansetron **OR** ondansetron (ZOFRAN) IV, polyvinyl alcohol, promethazine, senna-docusate, traMADol   Data Review:   Micro Results Recent Results (from the past 240 hour(s))  Blood Culture (routine x 2)     Status: None (Preliminary result)   Collection Time: 09/12/2016  1:51 PM  Result Value Ref Range Status   Specimen Description BLOOD  RIGHT AC  Final   Special Requests BOTTLES DRAWN AEROBIC AND ANAEROBIC  BCAV   Final   Culture NO GROWTH < 24 HOURS  Final   Report Status PENDING  Incomplete  Blood Culture (routine x 2)     Status: None (Preliminary result)   Collection Time: 09/13/2016   1:51 PM  Result Value Ref Range Status   Specimen Description BLOOD  LEFT AC  Final   Special Requests BOTTLES DRAWN AEROBIC AND ANAEROBIC BCAV   Final   Culture  Setup Time   Final    Organism ID to follow GRAM POSITIVE COCCI AEROBIC BOTTLE ONLY CRITICAL RESULT CALLED TO, READ BACK BY AND VERIFIED WITH: HOLLY GILLIAM October 07, 2016  1302 SGD    Culture GRAM POSITIVE COCCI  Final   Report Status PENDING  Incomplete  Blood Culture ID Panel (Reflexed)     Status: Abnormal   Collection Time: 09/06/16  1:51 PM  Result Value Ref Range Status   Enterococcus species NOT DETECTED NOT DETECTED Final   Listeria monocytogenes NOT DETECTED NOT DETECTED Final   Staphylococcus species DETECTED (A) NOT DETECTED Final    Comment: Methicillin (oxacillin) susceptible coagulase negative staphylococcus. Possible blood culture contaminant (unless isolated from more than one blood culture draw or clinical case suggests pathogenicity). No antibiotic treatment is indicated for blood  culture contaminants. CRITICAL RESULT CALLED TO, READ BACK BY AND VERIFIED WITH: HOLLY GILLIAM Oct 07, 2016 1302 SGD    Staphylococcus aureus NOT DETECTED NOT DETECTED Final   Methicillin resistance NOT DETECTED NOT DETECTED Final   Streptococcus species NOT DETECTED NOT DETECTED Final   Streptococcus agalactiae NOT DETECTED NOT DETECTED Final   Streptococcus pneumoniae NOT DETECTED NOT DETECTED Final   Streptococcus pyogenes NOT DETECTED NOT DETECTED Final   Acinetobacter baumannii NOT DETECTED NOT DETECTED Final   Enterobacteriaceae species NOT DETECTED NOT DETECTED Final   Enterobacter cloacae complex NOT DETECTED NOT DETECTED Final   Escherichia coli NOT DETECTED NOT DETECTED Final   Klebsiella oxytoca NOT DETECTED NOT DETECTED Final   Klebsiella pneumoniae NOT DETECTED NOT DETECTED Final   Proteus species NOT DETECTED NOT DETECTED Final   Serratia marcescens NOT DETECTED NOT DETECTED Final   Haemophilus influenzae NOT DETECTED NOT  DETECTED Final   Neisseria meningitidis NOT DETECTED NOT DETECTED Final   Pseudomonas aeruginosa NOT DETECTED NOT DETECTED Final   Candida albicans NOT DETECTED NOT DETECTED Final   Candida glabrata NOT DETECTED NOT DETECTED Final   Candida krusei NOT DETECTED NOT DETECTED Final   Candida parapsilosis NOT DETECTED NOT DETECTED Final   Candida tropicalis NOT DETECTED NOT DETECTED Final  MRSA PCR Screening     Status: None   Collection Time: 10/01/2016  4:41 PM  Result Value Ref Range Status   MRSA by PCR NEGATIVE NEGATIVE Final    Comment:        The GeneXpert MRSA Assay (FDA approved for NASAL specimens only), is one component of a comprehensive MRSA colonization surveillance program. It is not intended to diagnose MRSA infection nor to guide or monitor treatment for MRSA infections.     Radiology Reports Dg Chest Port 1 View  Result Date: 10-05-2016 CLINICAL DATA:  ET tube placement Hx - CAD, cardiomyopathy, CHF, COPD, diabetes, MI, AICD, bladder cancer, prostate cancer EXAM: PORTABLE CHEST 1 VIEW COMPARISON:  Chest x-ray from earlier same day FINDINGS: Carina is not well visualized on this chest x-ray. Based on earlier chest x-rays, the endotracheal tube tip is likely located approximately 5-10 mm above the level of the carina. Left chest wall pacemaker appears stable in position. Right chest wall Port-A-Cath is stable in position with tip appropriately positioned at the level of the lower SVC. Heart size and mediastinal contours are stable. Atherosclerotic changes noted at the aortic arch. Lungs are clear. No pleural effusion or pneumothorax seen. IMPRESSION: 1. Endotracheal tube has been placed. Carina is not well seen on this chest x-ray. Based on earlier chest x-rays, I believe the tip of the endotracheal tube is in satisfactory position located slightly above the level of the carina. 2. Lungs are clear.  No evidence of pneumonia or pulmonary edema. 3. Aortic atherosclerosis.  Electronically Signed   By: Franki Cabot M.D.   On: Oct 05, 2016 08:09   Dg Chest Port 1 View  Result Date: 09/15/2016 CLINICAL DATA:  Hypotension. Prostate carcinoma. Pulmonary embolus. EXAM: PORTABLE CHEST 1 VIEW COMPARISON:  Chest radiograph Sep 05, 2016.  Chest CT September 06, 2016 FINDINGS: There is no edema or consolidation. Pacemaker leads are attached to the right atrium, right ventricle, and coronary sinus. Heart is upper normal in size with pulmonary vascularity within normal limits. Port-A-Cath tip is in the superior vena cava. No pneumothorax. There is aortic atherosclerosis. No evident bone lesions. No adenopathy appreciable by radiography. IMPRESSION: No edema or consolidation. Stable cardiac silhouette. There is aortic atherosclerosis. Electronically Signed   By: Lowella Grip III M.D.   On: 09/24/2016 14:08   Dg Chest Portable 1 View  Result Date: 09/05/2016 CLINICAL DATA:  Increased weakness and bilateral leg pain and cramping. EXAM: PORTABLE CHEST 1 VIEW COMPARISON:  05/17/2015 FINDINGS: The heart size and mediastinal contours are within normal limits. There is aortic atherosclerosis with slight uncoiling of the thoracic aorta. ICD device projects over the left hemithorax with leads in the right atrium, coronary sinus and right ventricle. Port catheter tip is seen at the cavoatrial junction. No pneumonic consolidation or CHF. Minimal atelectasis at the lung bases. The visualized skeletal structures are unremarkable. IMPRESSION: No acute cardiopulmonary disease. Aortic atherosclerosis with ICD in place. Electronically Signed   By: Ashley Royalty M.D.   On: 09/05/2016 18:22   Ct Angio Chest/abd/pel For Dissection W And/or W/wo  Result Date: 09/29/2016 CLINICAL DATA:  Lateral leg pain final chronic the worse today, BILATERAL lower extremity weakness and numbness question dissection, history hypertension, coronary artery disease post MI, cardiomyopathy, diabetes mellitus, COPD, prostate cancer,  bladder cancer EXAM: CT ANGIOGRAPHY CHEST, ABDOMEN AND PELVIS TECHNIQUE: Multidetector CT imaging through the chest, abdomen and pelvis was performed using the standard protocol during bolus administration of intravenous contrast. Multiplanar reconstructed images and MIPs were obtained and reviewed to evaluate the vascular anatomy. CONTRAST:  100 cc Isovue 370 IV COMPARISON:  CT abdomen pelvis 05/14/2016, CT chest 09/10/2008 FINDINGS: CTA CHEST FINDINGS Cardiovascular: Atherosclerotic calcifications aorta, proximal great vessels and minimally in coronary arteries. Beam hardening artifacts from pacemaker leads via LEFT subclavian approach extending into the RIGHT atrium and RIGHT ventricle as well as coronary sinus. Aneurysmal dilatation ascending thoracic aorta 4.3 cm transverse image 28. No intramural hemorrhage on precontrast images. Following contrast, normal aortic enhancement is seen without aortic dissection. Several artifacts are present at the inferior aortic arch, felt to be related to beam hardening artifacts from a combination of calcified aortic plaque, pacemaker generator in anterior LEFT chest wall and dense contrast/pacemaker leads in the SVC. However, filling defects are identified in RIGHT lower lobe pulmonary arteries consistent with pulmonary embolism. Additional RIGHT upper lobe pulmonary embolus anteriorly image 42. No definite LEFT sided emboli. RV/LV ratio = 0.8, normal Mediastinum/Nodes: Predominately air-filled esophagus throughout its length. Suspect small hiatal hernia. Base of cervical region unremarkable. No thoracic adenopathy. Lungs/Pleura: Dependent atelectasis in the posterior lungs. Underlying emphysematous changes. No infiltrate, pleural effusion or pneumothorax. 7 mm nodule in RIGHT middle lobe at at minor fissure image 30 unchanged. Musculoskeletal: No acute osseous findings. Review of the MIP images confirms the above findings. CTA ABDOMEN AND PELVIS FINDINGS VASCULAR Aorta:  Atherosclerotic calcifications aorta with scattered thrombus. Aorta normal caliber without aneurysm or dissection. No periaortic hemorrhage or infiltration. Celiac: Atherosclerotic plaque at origin with less than 50% diameter stenosis SMA: Small amount of proximal SMA calcified plaque without significant stenosis Renals: Plaque at the origins of the renal arteries bilaterally with probably less than 50% stenosis on the LEFT and greater 50% stenosis on the RIGHT. IMA: Origin patent though arising between dense atherosclerotic plaques. Inflow: Scattered atherosclerotic plaques throughout the iliac and femoral systems without high-grade stenosis. Veins: Veins unopacified at time of CT imaging, patency not assessed. IVC filter noted infrarenal. Review of the MIP images confirms the above findings. NON-VASCULAR Hepatobiliary: Calcified gallstones within contracted gallbladder. Liver unremarkable Pancreas: Normal appearance Spleen: Normal appearance Adrenals/Urinary Tract: Thickening of adrenal glands without discrete mass. BILATERAL significant renal cortical atrophy without gross evidence of mass or hydronephrosis. Renal vascular calcifications. Bladder surgically absent with diversion urostomy in RIGHT lower quadrant. Nondilated ureters. Stomach/Bowel: Appendix surgically absent by history. Minimal sigmoid diverticulosis without evidence of diverticulitis. Stomach and bowel loops otherwise normal appearance. Lymphatic: No adenopathy Reproductive: Prostate gland surgically absent with nonvisualization of seminal vesicles. Other: No free air free fluid.  No hernia. Musculoskeletal: Demineralized with advanced degenerative disc disease changes at L4-L5 and significant facet degenerative changes at lower lumbar spine at multiple levels. Grade 1 anterolisthesis L4-L5 without spondylolysis. Review of the MIP images confirms the above findings. IMPRESSION: Extensive atherosclerotic calcifications including aorta, proximal  great vessels, abdominal branch vessels, and iliofemoral systems as above, only minimally in coronary arteries. No evidence of aortic aneurysm or dissection. Filling defects within multiple RIGHT pulmonary arteries consistent with pulmonary emboli. Cholelithiasis. Minimal distal colonic diverticulosis. Stable RIGHT middle lobe pulmonary nodule at minor fissure since 2010 indicative of  a benign process. Findings called to Dr. Mable Paris on 09/13/2016 at 1354 hr. Electronically Signed   By: Lavonia Dana M.D.   On: 09/15/2016 13:56     CBC  Recent Labs Lab 09/05/16 1755 10/03/2016 1226 2016-10-04 0600  WBC 13.0* 20.6* 22.5*  HGB 15.4 16.0 14.8  HCT 46.5 48.9 45.0  PLT 167 168 147*  MCV 99.0 99.8 99.7  MCH 32.8 32.6 32.9  MCHC 33.1 32.7 33.0  RDW 13.4 13.6 13.7  LYMPHSABS  --  3.1  --   MONOABS  --  1.4*  --   EOSABS  --  0.0  --   BASOSABS  --  0.2*  --     Chemistries   Recent Labs Lab 09/05/16 1755 09/29/2016 1226 October 04, 2016 0600  NA 136 133* 132*  K 4.5 5.1 6.0*  CL 99* 97* 103  CO2 25 17* 15*  GLUCOSE 156* 270* 243*  BUN 33* 48* 55*  CREATININE 2.31* 4.10* 4.85*  CALCIUM 10.3 10.3 8.4*  MG 1.7 1.8  --   AST  --  43*  --   ALT  --  21  --   ALKPHOS  --  65  --   BILITOT  --  1.2  --    ------------------------------------------------------------------------------------------------------------------ estimated creatinine clearance is 12.9 mL/min (A) (by C-G formula based on SCr of 4.85 mg/dL (H)). ------------------------------------------------------------------------------------------------------------------ No results for input(s): HGBA1C in the last 72 hours. ------------------------------------------------------------------------------------------------------------------ No results for input(s): CHOL, HDL, LDLCALC, TRIG, CHOLHDL, LDLDIRECT in the last 72  hours. ------------------------------------------------------------------------------------------------------------------ No results for input(s): TSH, T4TOTAL, T3FREE, THYROIDAB in the last 72 hours.  Invalid input(s): FREET3 ------------------------------------------------------------------------------------------------------------------ No results for input(s): VITAMINB12, FOLATE, FERRITIN, TIBC, IRON, RETICCTPCT in the last 72 hours.  Coagulation profile  Recent Labs Lab 09/16/2016 1226  INR 1.16    No results for input(s): DDIMER in the last 72 hours.  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  1. Sepsis with hypotension/septic shock/leukocytosis/lactic acidosis: Continue Zosyn and vancomycin  blood cx negative  2. Multiple pulmonary emboli seen on CT scan: Heparin drip Echocardiogram pending  3. Acute on chronic kidney disease stage III: This is due to shock Renal function worst nephrlogy consult recommended  4. Diabetes: Sliding scale ordered Refer to ICU diabetes protocol.  5. Bilateral lower extremity pain with chronic pain and severe atherosclerotic disease Vascular surgery consult Physical therapy consult Pain management Now that blood pressures improved perfusion to lower extremities has improved  6. Elevated troponin: Trend troponins due to dmenad ishemia  7. Mild hyponatremia: continue iv  8. Combined systolic and diastolic heart failure EF of 25% status post AICD/ and pacemaker  9. Recurrent bladder  cancer: Patient is followed by cancer Center      Code Status Orders        Start     Ordered   10/04/2016 1049  Do not attempt resuscitation (DNR)  Continuous    Question Answer Comment  In the event of cardiac or respiratory ARREST Do not call a "code blue"   In the event of cardiac or respiratory ARREST Do not perform Intubation, CPR, defibrillation or ACLS   In the event of cardiac or respiratory  ARREST Use medication by any route, position, wound care, and other measures to relive pain and suffering. May use oxygen, suction and manual treatment of airway obstruction as needed for comfort.      September 19, 2016 1051    Code Status History    Date Active Date Inactive Code Status Order ID Comments User Context   09-19-16  8:45 AM 09-19-16 10:51 AM DNR [id number]  Laverle Hobby, MD Inpatient   09/26/2016  4:10 PM 19-Sep-2016  8:45 AM Full Code [id number]  Bettey Costa, MD Inpatient   09/11/2016  2:28 PM 09/19/2016  4:10 PM Partial Code [id number]  Bettey Costa, MD ED   11/17/2015  5:05 PM 11/17/2015  9:05 PM Full Code [id number]  Deboraha Sprang, MD Inpatient   09/22/2015 12:04 AM 09/23/2015  5:34 PM Full Code [id number]  Alesia Richards, MD ED   09/22/2015 12:02 AM 09/22/2015 12:04 AM Full Code [id number]  Alesia Richards, MD ED   09/21/2015 11:43 PM 09/22/2015 12:02 AM Full Code [id number]  Alesia Richards, MD ED   01/09/2015 11:58 PM 01/11/2015  4:07 PM Full Code [id number]  Lance Coon, MD Inpatient   12/05/2014  4:37 PM 12/06/2014 11:33 AM Full Code [id number]  Algernon Huxley, MD Inpatient           Consults   intensivist  DVT Prophylaxis  Lovenox   Lab Results  Component Value Date   PLT 147 (L) 09/19/16     Time Spent in minutes   45min Greater than 50% of time spent in care coordination and counseling patient regarding the condition and plan of care.   Dustin Flock M.D on 09/19/2016 at 3:00 PM  Between 7am to 6pm - Pager - [phone]  After 6pm go to [email or web address] - password [email or web address]  [facility]   Office  [phone]

## 2016-10-06 NOTE — Progress Notes (Signed)
   10-03-16 1000  Clinical Encounter Type  Visited With Family;Patient not available;Health care provider  Visit Type Patient actively dying  Referral From Nurse  Spiritual Encounters  Spiritual Needs Prayer;Grief support  Fulda responded to page from RN; patient actively dying and [place] remained at bedside until family arrived; [facility] spoke briefly with family and is available as needed to support family spiritual needs. 10:26 AM Gwynn Burly

## 2016-10-06 NOTE — Progress Notes (Signed)
CDS notified of death, pt not a donor

## 2016-10-06 NOTE — Progress Notes (Signed)
Call received from central tele regarding patient cardiac status.  Patient in Vtach/Vfib for approx 1-2 min. Patient complaining of chest pain, morphine 4mg  given remains alert and with pulse.  Bryce Lacy MD "at bedside" by camera managing patient.  Orders received Amio 150mg  given stat, patient intubated. See [facility] for further updates

## 2016-10-06 NOTE — Progress Notes (Signed)
Pt expired at 74, family in room. Pronounced by myself and Adalberto Ill. Notified Dr Ashby Dawes

## 2016-10-06 NOTE — Progress Notes (Signed)
Pt extubated to comfort care, family and chaplain present. Bereavement cart ordered.

## 2016-10-06 NOTE — Consult Note (Signed)
[place] paged to comfort family at bedside; Family pastor arrived and is with family. Ch available as needed. Gwynn Burly

## 2016-10-06 NOTE — Progress Notes (Signed)
eLink Physician-Brief Progress Note Patient Name: Bryce Clark DOB: 01-20-1933 MRN: [id number]   Date of Service  Oct 02, 2016  HPI/Events of Note  Patient with worsening of clinical condition c/o of CP and then with runs of VT.  Had been given 4 of MSO4 for chest pain earlier.  Amio 150 mg bolus ordered but then drop in sats to the 70s.  Physcian extender to beside and patient intubated.  HR improved but hypotensive.  Labs show a potassium of 8.0  eICU Interventions  Intubate F/U on ABG NEO for BP support rx for hyperkalemia     Intervention Category Major Interventions: Arrhythmia - evaluation and management;Hypotension - evaluation and management  Tasfia Vasseur Oct 02, 2016, 6:30 AM

## 2016-10-06 NOTE — Procedures (Signed)
Intubation Procedure Note Bryce Clark [id number] [birth year]  Procedure: Intubation Indications: Respiratory insufficiency  Procedure Details Consent: Risks of procedure as well as the alternatives and risks of each were explained to the (patient/caregiver).  Consent for procedure obtained. Time Out: Verified patient identification, verified procedure, site/side was marked, verified correct patient position, special equipment/implants available, medications/allergies/relevent history reviewed, required imaging and test results available.  Performed  Drugs:  76mcg Fentanyl,2mg  Versed, , 50mg  Rocuronium. DL xwith # 3 blade. Grade 1 view. ET tube visualized passing through vocal cords. Following intubation:  positive color change on ETCO2, condensation seen in endotracheal tube, equal breath sounds bilaterally.  Evaluation Hemodynamic Status: Transient hypotension treated with pressors and treated with fluid; O2 sats: stable throughout Patient's Current Condition: stable Complications: No apparent complications Patient did tolerate procedure well. Chest X-ray ordered to verify placement.  CXR: pending.   [facility]

## 2016-10-06 NOTE — Progress Notes (Signed)
Patient extubated to room air for comfort measures.

## 2016-10-06 NOTE — Progress Notes (Signed)
Nephew in room waiting on estranged daughter's arrival before withdrawal.

## 2016-10-06 NOTE — Progress Notes (Signed)
ANTICOAGULATION CONSULT NOTE - Initial Consult  Pharmacy Consult for Heparin Indication: pulmonary embolus  Allergies  Allergen Reactions  . Xarelto [Rivaroxaban] Other (See Comments)    Reaction:  Bleeding   . Azithromycin Other (See Comments)    Reaction:  Cold sweats   . Codeine Nausea And Vomiting  . Oxycodone-Acetaminophen Nausea And Vomiting    Patient Measurements: Height: 6' (182.9 cm) Weight: 182 lb 5.1 oz (82.7 kg) IBW/kg (Calculated) : 77.66', 81.6 kg Heparin Dosing Weight: 81.6 kg  Vital Signs: Temp: 97.5 F (36.4 C) (06/01 1915) Temp Source: Axillary (06/01 1915) BP: 99/71 (06/02 0000) Pulse Rate: 96 (06/02 0000)  Labs:  Recent Labs  09/05/16 1755 09/23/2016 1226 Sep 17, 2016 0012  HGB 15.4 16.0  --   HCT 46.5 48.9  --   PLT 167 168  --   APTT  --  29  --   LABPROT  --  14.9  --   INR  --  1.16  --   HEPARINUNFRC  --   --  <0.10*  CREATININE 2.31* 4.10*  --   CKTOTAL  --  314  --   TROPONINI  --  0.04* 0.11*    Estimated Creatinine Clearance: 15.2 mL/min (A) (by C-G formula based on SCr of 4.1 mg/dL (H)).   Medical History: Past Medical History:  Diagnosis Date  . AICD (automatic cardioverter/defibrillator) present   . Arthritis   . Bladder cancer ([place])   . CAD (coronary artery disease)   . Cardiomyopathy ([place])   . Chronic combined systolic and diastolic CHF (congestive heart failure) ([place])   . Chronic renal insufficiency   . COPD (chronic obstructive pulmonary disease) ([place])   . Diabetes mellitus without complication ([place])   . Dysrhythmia   . GERD (gastroesophageal reflux disease)   . Gout   . History of permanent cardiac pacemaker placement   . Hypertension   . Myocardial infarction ([place])   . Presence of permanent cardiac pacemaker   . Prostate cancer [facility])      Assessment: 83 yo male found to have multiple right-sided PEs starting on heparin drip.  No oral anticoagulants noted on PTA med list.   Goal of Therapy:  Heparin level  0.3-0.7 units/ml Monitor platelets by anticoagulation protocol: Yes   Plan:  APTT and INR ordered stat add on - called lab and stated they have appropriate tube Heparin bolus 4000 units IV x1 then heparin drip 1300 units/hr (=13 ml/hr) Heparin level in 8h after starting drip CBC in AM  6/2 00:00 heparin level <0.1. 2500 unit bolus and increase rate to 1600 units/hr. Recheck in 8 hours.  Pharmacy will continue to follow.  Clarion Mooneyhan S Sep 17, 2016,1:26 AM

## 2016-10-06 NOTE — Death Summary Note (Signed)
  DEATH SUMMARY   Patient Details  Name: Bryce Clark MRN: [id number] DOB: 10-11-1932  Admission/Discharge Information   Admit Date:  09/22/2016  Date of Death: Date of Death: 09-23-16  Time of Death: Time of Death: 80  Length of Stay: 1  Referring Physician: Cletis Athens, MD   Reason(s) for Hospitalization  --  Diagnoses  Preliminary cause of death:  Secondary Diagnoses (including complications and co-morbidities):  Active Problems:   Shock ([place])   Pulmonary embolus ([place])   Acute kidney injury [facility])   Brief Hospital Course (including significant findings, care, treatment, and services provided and events leading to death)  [name] is a 83 y.o. year old male who was admitted with acute PE, intubated. He was noted to have multiple chronic medical conditions, including severe vasculopathy, CHF with EF=. 25% He developed ARF with hyperkalemia.   He continually declined, his BP declined and required pressors. His family opted for comfort measure only. He was terminally extubated and passed away soon after.     Pertinent Labs and Studies  Significant Diagnostic Studies   Microbiology No results found for this or any previous visit (from the past 240 hour(s)).  Lab Basic Metabolic Panel: No results for input(s): NA, K, CL, CO2, GLUCOSE, BUN, CREATININE, CALCIUM, MG, PHOS in the last 168 hours. Liver Function Tests: No results for input(s): AST, ALT, ALKPHOS, BILITOT, PROT, ALBUMIN in the last 168 hours. No results for input(s): LIPASE, AMYLASE in the last 168 hours. No results for input(s): AMMONIA in the last 168 hours. CBC: No results for input(s): WBC, NEUTROABS, HGB, HCT, MCV, PLT in the last 168 hours. Cardiac Enzymes: No results for input(s): CKTOTAL, CKMB, CKMBINDEX, TROPONINI in the last 168 hours. Sepsis Labs: No results for input(s): PROCALCITON, WBC, LATICACIDVEN in the last 168 hours.  Procedures/Operations  --   Laverle Hobby 09/25/2016, 1:12 PM

## 2016-10-06 DEATH — deceased

## 2016-11-15 ENCOUNTER — Ambulatory Visit: Payer: Medicare HMO

## 2016-11-15 ENCOUNTER — Other Ambulatory Visit: Payer: Medicare HMO

## 2016-11-18 ENCOUNTER — Ambulatory Visit: Payer: Medicare HMO | Admitting: Oncology
